# Patient Record
Sex: Male | Born: 1950
Health system: Southern US, Community
[De-identification: ages and names within clinical notes are randomized; demographics above are authoritative.]

## PROBLEM LIST (undated history)

## (undated) DIAGNOSIS — J45909 Unspecified asthma, uncomplicated: Secondary | ICD-10-CM

## (undated) DIAGNOSIS — T7840XA Allergy, unspecified, initial encounter: Secondary | ICD-10-CM

## (undated) DIAGNOSIS — E785 Hyperlipidemia, unspecified: Secondary | ICD-10-CM

## (undated) DIAGNOSIS — L409 Psoriasis, unspecified: Secondary | ICD-10-CM

## (undated) DIAGNOSIS — Z87442 Personal history of urinary calculi: Secondary | ICD-10-CM

## (undated) DIAGNOSIS — R918 Other nonspecific abnormal finding of lung field: Secondary | ICD-10-CM

## (undated) DIAGNOSIS — H269 Unspecified cataract: Secondary | ICD-10-CM

## (undated) DIAGNOSIS — M1990 Unspecified osteoarthritis, unspecified site: Secondary | ICD-10-CM

## (undated) DIAGNOSIS — L509 Urticaria, unspecified: Secondary | ICD-10-CM

## (undated) DIAGNOSIS — I10 Essential (primary) hypertension: Secondary | ICD-10-CM

## (undated) HISTORY — DX: Hyperlipidemia, unspecified: E78.5

## (undated) HISTORY — DX: Essential (primary) hypertension: I10

## (undated) HISTORY — DX: Unspecified asthma, uncomplicated: J45.909

## (undated) HISTORY — DX: Unspecified cataract: H26.9

## (undated) HISTORY — PX: COLON SURGERY: SHX602

## (undated) HISTORY — DX: Urticaria, unspecified: L50.9

## (undated) HISTORY — DX: Allergy, unspecified, initial encounter: T78.40XA

## (undated) HISTORY — PX: JOINT REPLACEMENT: SHX530

---
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
## 1998-02-13 ENCOUNTER — Emergency Department (HOSPITAL_COMMUNITY): Admission: EM | Admit: 1998-02-13 | Discharge: 1998-02-13 | Payer: Self-pay | Admitting: Emergency Medicine

## 2004-09-15 ENCOUNTER — Inpatient Hospital Stay (HOSPITAL_COMMUNITY): Admission: EM | Admit: 2004-09-15 | Discharge: 2004-09-18 | Payer: Self-pay | Admitting: Emergency Medicine

## 2004-09-16 ENCOUNTER — Encounter (INDEPENDENT_AMBULATORY_CARE_PROVIDER_SITE_OTHER): Payer: Self-pay | Admitting: Specialist

## 2004-10-21 ENCOUNTER — Ambulatory Visit (HOSPITAL_COMMUNITY): Admission: RE | Admit: 2004-10-21 | Discharge: 2004-10-21 | Payer: Self-pay | Admitting: Family Medicine

## 2004-10-22 ENCOUNTER — Ambulatory Visit: Payer: Self-pay | Admitting: Internal Medicine

## 2005-02-05 ENCOUNTER — Ambulatory Visit: Payer: Self-pay | Admitting: Internal Medicine

## 2005-02-13 ENCOUNTER — Ambulatory Visit: Payer: Self-pay | Admitting: Internal Medicine

## 2005-05-11 DIAGNOSIS — R918 Other nonspecific abnormal finding of lung field: Secondary | ICD-10-CM

## 2005-05-11 HISTORY — PX: CHOLECYSTECTOMY: SHX55

## 2005-05-11 HISTORY — DX: Other nonspecific abnormal finding of lung field: R91.8

## 2005-08-20 ENCOUNTER — Encounter: Admission: RE | Admit: 2005-08-20 | Discharge: 2005-08-20 | Payer: Self-pay | Admitting: Family Medicine

## 2010-03-12 ENCOUNTER — Encounter: Admission: RE | Admit: 2010-03-12 | Discharge: 2010-03-12 | Payer: Self-pay | Admitting: Family Medicine

## 2010-05-11 DIAGNOSIS — I10 Essential (primary) hypertension: Secondary | ICD-10-CM

## 2010-05-11 HISTORY — DX: Essential (primary) hypertension: I10

## 2010-09-26 NOTE — Discharge Summary (Signed)
NAMEYERICK, EGGEBRECHT NO.:  [id number]   MEDICAL RECORD NO.:  [id number]          PATIENT TYPE:  INP   LOCATION:  5741                         FACILITY:  [facility]   PHYSICIAN:  Guy Franco, P.A.       DATE OF BIRTH:  1951-02-14   DATE OF ADMISSION:  09/15/2004  DATE OF DISCHARGE:  09/18/2004                                 DISCHARGE SUMMARY   DISCHARGE DIAGNOSES:  1.  Acute gangrenous cholecystitis status post laparoscopic cholecystectomy      with intraoperative , as well as, JP drain (removed prior to discharge).  2.  Hyperlipidemia.  3.  Hypertension.  4.  Status post vasectomy.  5.  History of jaw surgery.   HOSPITAL COURSE:  Mr. Larmon is a 60 year old male patient who had a less  than 24 hour of right upper quadrant pain prior to admission.  He stated  that he had a similar attack last week that resolved spontaneously.  He  initially saw his primary care physician on the day of admission and was  found to have a white count of 19,000.  He was then prompted to [facility]gency Room where CT of the abdomen revealed acute cholecystitis.   The patient then underwent a laparoscopic cholecystectomy with  intraoperative cholangiogram, as well as, JP tube placement, once he was  diagnosed with acute gangrenous cholecystitis.  He tolerated procedure well.  He remained in the hospital for approximately two days, and on postop day  #2, he was ready for discharge to home.  Prior to his discharge to home, his  JP drainage was minimal, and this was pulled without problem.   DISCHARGE MEDICATIONS:  1.  Lotrel 5/20 one tablet daily.  2.  Crestor 10 mg daily.  3.  Baby aspirin daily.  4.  Tylox as needed for pain.   DISCHARGE INSTRUCTIONS:  He is not to lift for two weeks.  He may return to  work once.  We have reevaluated him in the office.  He is to return to the  office in 10-14 days for an appointment.  He is to call for this  appointment.  He may remove his  bandages in the shower.      LB/MEDQ  D:  09/18/2004  T:  09/18/2004  Job:  [id number]   cc:   Lorne Skeens. Hoxworth, M.D.  [address], [address]  [place]  [place] [address]   Ernestina Penna, M.D.  [address] [place]  [place] [address]  Fax: [phone]

## 2010-09-26 NOTE — Op Note (Signed)
Thomas Livingston, GOEDKEN NO.:  [id number]   MEDICAL RECORD NO.:  [id number]          PATIENT TYPE:  INP   LOCATION:  5741                         FACILITY:  [facility]   PHYSICIAN:  Sharlet Salina T. Hoxworth, M.D.DATE OF BIRTH:  May 25, 1950   DATE OF PROCEDURE:  09/16/2004  DATE OF DISCHARGE:                                 OPERATIVE REPORT   PREOPERATIVE DIAGNOSIS:  Acute cholecystitis.   POSTOPERATIVE DIAGNOSIS:  Acute gangrenous cholecystitis.   SURGICAL PROCEDURES:  Laparoscopic cholecystectomy with intraoperative  cholangiogram.   SURGEON:  Sharlet Salina T. Hoxworth, M.D.   ANESTHESIA:  General.   ASSISTANT:  None.   HISTORY OF PRESENT ILLNESS:  [name] is a 60 year old male who presents  with several days of acute abdominal pain. CT scan has shown significant  thickening and inflammatory change of the gallbladder. I have recommended  proceeding with laparoscopic cholecystectomy with cholangiogram for apparent  acute cholecystitis.  The nature of the procedure, its indications and risks  of bleeding, infection, bile leak, bile duct injury, possible need for open  procedure were discussed understood. He is now brought to the operating room  for this procedure.   DESCRIPTION OF PROCEDURE:  The patient was brought to the operating room and  placed in supine position on the operating table and general endotracheal  anesthesia induced.  He was already on broad-spectrum antibiotics.  The  abdomen was widely sterilely prepped and draped. Local anesthesia was used  to infiltrate the trocar sites prior to the incisions.  A 1 cm incision was  made at the umbilicus. Dissection carried down the midline fascia which was  sharply incised for 1 cm and the peritoneum entered under direct vision.  Through a mattress suture of 0 Vicryl, the Hasson trocar was placed and  pneumoperitoneum established. Under direct vision a 10 mm trocar was placed  in subxiphoid area and two 5 mm  trocars on the right subcostal margin. The  gallbladder was exposed by peeling back inflammatory attachments of the  omentum and was seen to be severely and acutely inflamed and thickened with  areas of gangrenous change. The gallbladder was decompressed through the  aspiration needle to allow the fundus to be grasped and elevated over the  liver. The infundibulum was retracted inferolaterally.  With the 30 degrees  scope in place, the infundibulum,  Calot's triangle and porta hepatis could  be well visualized. Using mostly blunt dissection with irrigation, the  distal gallbladder and Calot's triangle was dissected relatively easily due  to the acute edema and inflammatory changes. The cystic duct was identified  and dissected out, the cystic duct gallbladder junction dissected 360  degrees. The cystic artery was also identified in Calot's triangle coursing  up on the gallbladder wall. When the anatomy was clear, the cystic duct was  clipped at the gallbladder junction.  Operative cholangiogram obtained  through the cystic duct which showed good filling of the common bile duct  and intrahepatic ducts with free flow into the duodenum and no filling  defects. Following this, cholangiocath was removed and the cystic duct was  triply clipped  proximally and divided. The cystic artery was doubly clipped  proximally, clipped distally and divided.  The gallbladder was then  dissected free from its bed using hook cautery. The dissection was tedious  due to severe inflammatory changes but proceeded satisfactorily until the  gallbladder was detached, placed in an EndoCatch bag and brought through the  umbilicus. Following this, the gallbladder bed was irrigated and complete  hemostasis obtained with cautery and Surgicel pack. A closed suction drain  was left in the subhepatic space and Morrisons pouch and brought out through  one of the lateral trocar sites. All trocars removed and CO2 evacuated. The   mattress sutures secured at the umbilicus. Skin incisions were closed with  interrupted subcuticular 4-0 Monocryl and Steri-Strips.  Sponge, needle and  instrument counts were correct.  Dry sterile dressings were applied. The  patient taken to recovery in good condition.      BTH/MEDQ  D:  09/16/2004  T:  09/16/2004  Job:  [id number]

## 2010-09-26 NOTE — H&P (Signed)
Thomas Livingston, Thomas Livingston NO.:  [id number]   MEDICAL RECORD NO.:  [id number]          PATIENT TYPE:  INP   LOCATION:  1830                         FACILITY:  [facility]   PHYSICIAN:  Gabrielle Dare. [name], M.D.DATE OF BIRTH:  November 06, 1950   DATE OF ADMISSION:  09/15/2004  DATE OF DISCHARGE:                                HISTORY & PHYSICAL   CHIEF COMPLAINT:  Right upper quadrant abdominal pain.   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  He had a similar attack of this pain last  week that resolved spontaneously after about 3 hours, this pain that started  last night at 11 p.m. continued today and he went to his primary doctor this  afternoon, he was found to have an elevated white blood cell count of 19,000  and he was sent to [facility] Emergency Department for further evaluation  and workup here shows normal liver function tests, CT scan of the abdomen  and pelvis was then obtained which is consistent with acute cholecystitis.  The patient received some pain medications and his pain is significantly  decreased at this time.  He has some mild associated nausea.   PAST MEDICAL HISTORY:  Hypercholesterolemia and hypertension.   PAST SURGICAL HISTORY:  Jaw surgery for an infection, cysto and vasectomy.   CURRENT MEDICATIONS:  1.  Lotrel 5/20 one p.o. once daily.  2.  Crestor one p.o. once daily.  3.  Aspirin 81 mg p.o. once daily.   SOCIAL HISTORY:  He does not smoke or drink alcohol.  He works for the  [employer] department.   REVIEW OF SYSTEMS:  GENERAL:  Negative.  CARDIAC:  Negative.  RESPIRATORY:  Negative.  GI:  See the history of present illness.  GU:  Negative.  MUSCULOSKELETAL:  Negative.   PHYSICAL EXAMINATION:  VITAL SIGNS:  Temperature is 98.7, blood pressure  143/83, pulse 103, respirations 20.  GENERAL:  He is awake,  alert, in no acute distress.  HEAD AND NECK:  Pupils are reactive.  Sclera is clear with no icterus.  Neck  is supple with no tenderness.  LYMPH:  He has no periumbilical, supraclavicular or cervical adenopathy.  CHEST:  Lungs are clear to auscultation bilaterally.  Heart is regular rate  and rhythm, point of maximal impulse is palpable laterally in the left  chest.  ABDOMEN:  Abdomen is soft and nondistended.  He has mild right upper  quadrant tenderness to deep palpation with no guarding, no masses are noted,  no organomegaly is palpated.  SKIN:  Warm and dry with no rashes.  MUSCULOSKELETAL EXAM:  Strength is equal in upper and lower extremities  bilaterally.   DATA REVIEWED INCLUDES:  Sodium 132, potassium 3.8, chloride 100, CO2 25,  BUN 6, creatinine 1.  Liver function tests within normal limits.  CT scan of  the abdomen and pelvis as above.   IMPRESSION AND PLAN:  Acute cholecystitis.  Plan will be to admit the  patient, give him intravenous fluids and intravenous antibiotics, we will  also plan laparoscopic cholecystectomy possibly tomorrow.  I will discuss  this in detail with Dr. Johna Sheriff who is working in the hospital for our  group this week.  I did go on and discuss the procedure risks and benefits  with the patient and his wife, questions were answered and he is agreeable  with the plan.      BET/MEDQ  D:  09/15/2004  T:  09/15/2004  Job:  [id number]

## 2010-11-07 ENCOUNTER — Encounter: Payer: Self-pay | Admitting: Gastroenterology

## 2012-08-19 ENCOUNTER — Telehealth: Payer: Self-pay | Admitting: Family Medicine

## 2012-08-19 MED ORDER — ROSUVASTATIN CALCIUM 10 MG PO TABS: 10.0000 mg | ORAL_TABLET | Freq: Every day | ORAL | Status: DC

## 2012-08-19 NOTE — Telephone Encounter (Signed)
Rx Refilled  

## 2012-08-29 ENCOUNTER — Other Ambulatory Visit: Payer: Self-pay | Admitting: Family Medicine

## 2012-09-13 ENCOUNTER — Other Ambulatory Visit: Payer: Self-pay | Admitting: Dermatology

## 2012-10-05 ENCOUNTER — Ambulatory Visit (INDEPENDENT_AMBULATORY_CARE_PROVIDER_SITE_OTHER): Payer: 59 | Admitting: Family Medicine

## 2012-10-05 ENCOUNTER — Encounter: Payer: Self-pay | Admitting: Family Medicine

## 2012-10-05 VITALS — BP 110/76 | HR 80 | Temp 98.7°F | Resp 16 | Wt 229.0 lb

## 2012-10-05 DIAGNOSIS — T7840XA Allergy, unspecified, initial encounter: Secondary | ICD-10-CM

## 2012-10-05 DIAGNOSIS — E785 Hyperlipidemia, unspecified: Secondary | ICD-10-CM | POA: Insufficient documentation

## 2012-10-05 DIAGNOSIS — I10 Essential (primary) hypertension: Secondary | ICD-10-CM | POA: Insufficient documentation

## 2012-10-05 MED ORDER — PREDNISONE 20 MG PO TABS: ORAL_TABLET | ORAL | Status: DC

## 2012-10-05 MED ORDER — EPINEPHRINE 0.3 MG/0.3ML IJ SOAJ: 0.3000 mg | Freq: Once | INTRAMUSCULAR | Status: DC

## 2012-10-05 MED ORDER — METHYLPREDNISOLONE ACETATE 80 MG/ML IJ SUSP
80.0000 mg | Freq: Once | INTRAMUSCULAR | Status: AC
  Administered 2012-10-05: 80 mg via INTRAMUSCULAR

## 2012-10-05 NOTE — Progress Notes (Signed)
  Subjective:    Patient ID: Thomas Livingston, male    DOB: 1950/09/09, 62 y.o.   MRN: [id number]  HPI Beginning Friday the patient noticed widespread urticaria on his trunk, back, and arms. Beginning Saturday he noticed swelling in his upper lip. He denies any swelling of his tongue. He denies any stridor.  He denies any recent ingestions which may be unusual for him. He has been eating more strawberries recently. He is also had eggs.  He denies new nuts.  He is currently on Lotrel for his blood pressure. He has been taking that for years.Marland Kitchen  He is using Benadryl intermittently over the last few days. Past Medical History  Diagnosis Date  . Hypertension   . Hyperlipidemia    Current Outpatient Prescriptions on File Prior to Visit  Medication Sig Dispense Refill  . fluticasone (FLONASE) 50 MCG/ACT nasal spray Use 2 sprays in each  nostril daily  48 g  11  . rosuvastatin (CRESTOR) 10 MG tablet Take 1 tablet (10 mg total) by mouth daily.  90 tablet  3   No current facility-administered medications on file prior to visit.   Allergies  Allergen Reactions  . Chalk       Review of Systems  All other systems reviewed and are negative.       Objective:   Physical Exam  Vitals reviewed. Constitutional: He appears well-developed and well-nourished.  HENT:  Right Ear: External ear normal.  Left Ear: External ear normal.  Nose: Nose normal.  Mouth/Throat: Oropharynx is clear and moist. No oropharyngeal exudate.  Neck: Normal range of motion. Neck supple.  Cardiovascular: Normal rate and normal heart sounds.  Exam reveals no friction rub.   No murmur heard. Pulmonary/Chest: Effort normal and breath sounds normal. No respiratory distress. He has no wheezes. He has no rales. He exhibits no tenderness.  Abdominal: Soft. Bowel sounds are normal. He exhibits no distension and no mass. There is no tenderness. There is no rebound and no guarding.  Lymphadenopathy:    He has no cervical  adenopathy.  Skin: Skin is warm. Rash noted.   He has urticaria on his top. His upper lip is mildly swollen. He has no stridor       Assessment & Plan:  1. Allergic reaction, initial encounter I asked the patient to stop Lotrel immediately. I also asked him to avoid strawberries and headaches. Depo-Medrol 80 mg IM was given here. Advised patient to begin taking Benadryl 25 mg every 4 hours. Also prescribed prednisone dose pack. Recheck the patient in 24 hours if no better, sooner if worse. Recheck blood pressure in one week. - predniSONE (DELTASONE) 20 MG tablet; 3 tabs poqday 1-2, 2 tabs poqday 3-4, 1 tab poqday 5-6  Dispense: 12 tablet; Refill: 0 - EPINEPHrine (EPIPEN) 0.3 mg/0.3 mL DEVI; Inject 0.3 mLs (0.3 mg total) into the muscle once.  Dispense: 1 Device; Refill: 1

## 2012-12-14 ENCOUNTER — Encounter: Payer: Self-pay | Admitting: Family Medicine

## 2012-12-14 ENCOUNTER — Ambulatory Visit (INDEPENDENT_AMBULATORY_CARE_PROVIDER_SITE_OTHER): Payer: 59 | Admitting: Family Medicine

## 2012-12-14 VITALS — BP 110/74 | HR 68 | Temp 98.2°F | Resp 16 | Wt 219.0 lb

## 2012-12-14 DIAGNOSIS — J019 Acute sinusitis, unspecified: Secondary | ICD-10-CM

## 2012-12-14 MED ORDER — AMOXICILLIN-POT CLAVULANATE 875-125 MG PO TABS: 1.0000 | ORAL_TABLET | Freq: Two times a day (BID) | ORAL | Status: DC

## 2012-12-14 NOTE — Progress Notes (Signed)
Subjective:    Patient ID: Thomas Livingston, male    DOB: Sep 14, 1950, 62 y.o.   MRN: [id number]  HPI Patient reports one week of pain and pressure around his frontal sinuses. He has a constant dull headache. He awakens every morning with congestion and sneezing and rhinorrhea. He has an occasional cough due to postnasal drip. He denies any high fever. He has had some stiffness and tightness in neck. He has no meningismus today. He has no rashes on his body. He has had a recent tick bite. He denies any joint stiffness or swelling. He denies any neurologic deficits or migraine-like symptoms. Past Medical History  Diagnosis Date  . Hypertension   . Hyperlipidemia    Current Outpatient Prescriptions on File Prior to Visit  Medication Sig Dispense Refill  . aspirin 81 MG tablet Take 81 mg by mouth daily.      Marland Kitchen EPINEPHrine (EPIPEN) 0.3 mg/0.3 mL DEVI Inject 0.3 mLs (0.3 mg total) into the muscle once.  1 Device  1  . fluticasone (FLONASE) 50 MCG/ACT nasal spray Use 2 sprays in each  nostril daily  48 g  11  . rosuvastatin (CRESTOR) 10 MG tablet Take 1 tablet (10 mg total) by mouth daily.  90 tablet  3   No current facility-administered medications on file prior to visit.   Allergies  Allergen Reactions  . Chalk    History   Social History  . Marital Status: Married    Spouse Name: N/A    Number of Children: N/A  . Years of Education: N/A   Occupational History  . Not on file.   Social History Main Topics  . Smoking status: Never Smoker   . Smokeless tobacco: Not on file  . Alcohol Use: No  . Drug Use: No  . Sexually Active: Not on file   Other Topics Concern  . Not on file   Social History Narrative  . No narrative on file      Review of Systems  All other systems reviewed and are negative.       Objective:   Physical Exam  Vitals reviewed. HENT:  Right Ear: External ear normal.  Left Ear: External ear normal.  Nose: Nose normal.  Mouth/Throat: Oropharynx is  clear and moist. No oropharyngeal exudate.  Eyes: Conjunctivae and EOM are normal. Pupils are equal, round, and reactive to light.  Cardiovascular: Normal rate, regular rhythm, normal heart sounds and intact distal pulses.   No murmur heard. Pulmonary/Chest: Effort normal and breath sounds normal. No respiratory distress. He has no wheezes. He has no rales. He exhibits no tenderness.  Abdominal: Soft. Bowel sounds are normal. He exhibits no distension. There is no tenderness. There is no rebound.  Musculoskeletal: Normal range of motion.  Lymphadenopathy:    He has no cervical adenopathy.  Skin: Skin is warm. No rash noted. No erythema.   no meningismus.        Assessment & Plan:  1. Acute rhinosinusitis I believe this is either a tension headache or rhinosinusitis. I recommended Augmentin 875 by mouth twice a day for 10 days. Recommended ibuprofen 800 mg 3 times a day for headache. He is to use this sparingly and as needed. I recommended he push fluids to protect his kidneys and prevent possible dehydration due to is extensive work outside in the heat. Recheck in 1 week if no better or sooner if her - amoxicillin-clavulanate (AUGMENTIN) 875-125 MG per tablet; Take 1 tablet by mouth 2 (  two) times daily.  Dispense: 20 tablet; Refill: 0

## 2013-07-02 ENCOUNTER — Other Ambulatory Visit: Payer: Self-pay | Admitting: Family Medicine

## 2013-09-12 ENCOUNTER — Encounter: Payer: Self-pay | Admitting: Gastroenterology

## 2013-11-25 ENCOUNTER — Other Ambulatory Visit: Payer: Self-pay | Admitting: Family Medicine

## 2013-12-16 ENCOUNTER — Encounter (HOSPITAL_COMMUNITY): Payer: Self-pay | Admitting: Emergency Medicine

## 2013-12-16 ENCOUNTER — Emergency Department (INDEPENDENT_AMBULATORY_CARE_PROVIDER_SITE_OTHER)
Admission: EM | Admit: 2013-12-16 | Discharge: 2013-12-16 | Disposition: A | Discharge status: Home / Self Care | Payer: 59 | Source: Home / Self Care | Attending: Family Medicine | Admitting: Family Medicine

## 2013-12-16 DIAGNOSIS — S51809A Unspecified open wound of unspecified forearm, initial encounter: Secondary | ICD-10-CM

## 2013-12-16 DIAGNOSIS — S51801A Unspecified open wound of right forearm, initial encounter: Secondary | ICD-10-CM

## 2013-12-16 DIAGNOSIS — Z23 Encounter for immunization: Secondary | ICD-10-CM

## 2013-12-16 DIAGNOSIS — X58XXXA Exposure to other specified factors, initial encounter: Secondary | ICD-10-CM

## 2013-12-16 MED ORDER — TETANUS-DIPHTH-ACELL PERTUSSIS 5-2.5-18.5 LF-MCG/0.5 IM SUSP
INTRAMUSCULAR | Status: AC
  Filled 2013-12-16: qty 0.5

## 2013-12-16 MED ORDER — TETANUS-DIPHTH-ACELL PERTUSSIS 5-2.5-18.5 LF-MCG/0.5 IM SUSP
0.5000 mL | Freq: Once | INTRAMUSCULAR | Status: AC
  Administered 2013-12-16: 0.5 mL via INTRAMUSCULAR

## 2013-12-16 NOTE — ED Provider Notes (Signed)
Thomas Livingston is a 63 y.o. male who presents to Urgent Care today for Right forearm laceration. Patient had a thin light pole land on his forearm at home today. This resulted in a laceration. He feels well. The injury happened 40 mins prior to presenting. The pain is moderate. He denies any radiating pain or weakness or numbness. No fever or chills. He is unsure about his last tetanus vaccination.    Past Medical History  Diagnosis Date  . Hypertension   . Hyperlipidemia    History  Substance Use Topics  . Smoking status: Never Smoker   . Smokeless tobacco: Not on file  . Alcohol Use: No   ROS as above Medications: No current facility-administered medications for this encounter.   Current Outpatient Prescriptions  Medication Sig Dispense Refill  . amoxicillin-clavulanate (AUGMENTIN) 875-125 MG per tablet Take 1 tablet by mouth 2 (two) times daily.  20 tablet  0  . aspirin 81 MG tablet Take 81 mg by mouth daily.      . CRESTOR 10 MG tablet Take 1 tablet by mouth  daily  90 tablet  0  . EPINEPHrine (EPIPEN) 0.3 mg/0.3 mL DEVI Inject 0.3 mLs (0.3 mg total) into the muscle once.  1 Device  1  . fluticasone (FLONASE) 50 MCG/ACT nasal spray Use 2 sprays in each  nostril daily  48 g  11    Exam:  BP 137/84  Pulse 70  Temp(Src) 98.3 F (36.8 C) (Oral)  Resp 20  Ht 6\' 2"  (1.88 m)  Wt 217 lb (98.431 kg)  BMI 27.85 kg/m2  SpO2 98% Gen: Well NAD Skin: Right arm: Shallow V-shaped avulsion to right forearm aprox 2 cm in length. The skin is dusky.  Distal Arm: Normal sensation and pulses and motion.   No results found for this or any previous visit (from the past 24 hour(s)). No results found.  Assessment and Plan: 63 y.o. male with Skin avulsion. Shallow. Wound cleaned and dressed. F/u with PCP.  Unable to suture.  TDAP given prior to D/C  Discussed warning signs or symptoms. Please see discharge instructions. Patient expresses understanding.   This note was created using  Systems analyst. Any transcription errors are unintended.    Gregor Hams, MD 12/16/13 [phone]

## 2013-12-16 NOTE — ED Notes (Signed)
Pt reports avulsion to right forearm onset today about 45 minutes ago States an aluminum pipe landed on his arm Last tetanus unknown  Alert w/no signs of acute distress.

## 2013-12-16 NOTE — Discharge Instructions (Signed)
Thank you for coming in today. Keep the wound clean.  Come back as needed.  Follow up with your doctor.    Laceration Care, Adult A laceration is a cut or lesion that goes through all layers of the skin and into the tissue just beneath the skin. TREATMENT  Some lacerations may not require closure. Some lacerations may not be able to be closed due to an increased risk of infection. It is important to see your caregiver as soon as possible after an injury to minimize the risk of infection and maximize the opportunity for successful closure. If closure is appropriate, pain medicines may be given, if needed. The wound will be cleaned to help prevent infection. Your caregiver will use stitches (sutures), staples, wound glue (adhesive), or skin adhesive strips to repair the laceration. These tools bring the skin edges together to allow for faster healing and a better cosmetic outcome. However, all wounds will heal with a scar. Once the wound has healed, scarring can be minimized by covering the wound with sunscreen during the day for 1 full year. HOME CARE INSTRUCTIONS  For sutures or staples:  Keep the wound clean and dry.  If you were given a bandage (dressing), you should change it at least once a day. Also, change the dressing if it becomes wet or dirty, or as directed by your caregiver.  Wash the wound with soap and water 2 times a day. Rinse the wound off with water to remove all soap. Pat the wound dry with a clean towel.  After cleaning, apply a thin layer of the antibiotic ointment as recommended by your caregiver. This will help prevent infection and keep the dressing from sticking.  You may shower as usual after the first 24 hours. Do not soak the wound in water until the sutures are removed.  Only take over-the-counter or prescription medicines for pain, discomfort, or fever as directed by your caregiver.  Get your sutures or staples removed as directed by your caregiver. For skin  adhesive strips:  Keep the wound clean and dry.  Do not get the skin adhesive strips wet. You may bathe carefully, using caution to keep the wound dry.  If the wound gets wet, pat it dry with a clean towel.  Skin adhesive strips will fall off on their own. You may trim the strips as the wound heals. Do not remove skin adhesive strips that are still stuck to the wound. They will fall off in time. For wound adhesive:  You may briefly wet your wound in the shower or bath. Do not soak or scrub the wound. Do not swim. Avoid periods of heavy perspiration until the skin adhesive has fallen off on its own. After showering or bathing, gently pat the wound dry with a clean towel.  Do not apply liquid medicine, cream medicine, or ointment medicine to your wound while the skin adhesive is in place. This may loosen the film before your wound is healed.  If a dressing is placed over the wound, be careful not to apply tape directly over the skin adhesive. This may cause the adhesive to be pulled off before the wound is healed.  Avoid prolonged exposure to sunlight or tanning lamps while the skin adhesive is in place. Exposure to ultraviolet light in the first year will darken the scar.  The skin adhesive will usually remain in place for 5 to 10 days, then naturally fall off the skin. Do not pick at the adhesive film. You  may need a tetanus shot if:  You cannot remember when you had your last tetanus shot.  You have never had a tetanus shot. If you get a tetanus shot, your arm may swell, get red, and feel warm to the touch. This is common and not a problem. If you need a tetanus shot and you choose not to have one, there is a rare chance of getting tetanus. Sickness from tetanus can be serious. SEEK MEDICAL CARE IF:   You have redness, swelling, or increasing pain in the wound.  You see a red line that goes away from the wound.  You have yellowish-white fluid (pus) coming from the wound.  You have  a fever.  You notice a bad smell coming from the wound or dressing.  Your wound breaks open before or after sutures have been removed.  You notice something coming out of the wound such as wood or glass.  Your wound is on your hand or foot and you cannot move a finger or toe. SEEK IMMEDIATE MEDICAL CARE IF:   Your pain is not controlled with prescribed medicine.  You have severe swelling around the wound causing pain and numbness or a change in color in your arm, hand, leg, or foot.  Your wound splits open and starts bleeding.  You have worsening numbness, weakness, or loss of function of any joint around or beyond the wound.  You develop painful lumps near the wound or on the skin anywhere on your body. MAKE SURE YOU:   Understand these instructions.  Will watch your condition.  Will get help right away if you are not doing well or get worse. Document Released: 04/27/2005 Document Revised: 07/20/2011 Document Reviewed: 10/21/2010 [facility] Patient Information 2015 [place], [place]. This information is not intended to replace advice given to you by your health care provider. Make sure you discuss any questions you have with your health care provider.

## 2014-01-10 ENCOUNTER — Other Ambulatory Visit: Payer: Self-pay | Admitting: *Deleted

## 2014-01-10 MED ORDER — FLUTICASONE PROPIONATE 50 MCG/ACT NA SUSP: NASAL | Status: AC

## 2014-01-10 NOTE — Telephone Encounter (Signed)
Received fax requesting refill on Flonase.   Refill appropriate and filled per protocol.  

## 2014-02-05 ENCOUNTER — Other Ambulatory Visit: Payer: Self-pay | Admitting: Family Medicine

## 2014-02-05 NOTE — Telephone Encounter (Signed)
Medication filled x1 with no refills.   Requires office visit before any further refills can be given.   Letter sent.  

## 2014-02-12 ENCOUNTER — Ambulatory Visit (INDEPENDENT_AMBULATORY_CARE_PROVIDER_SITE_OTHER): Payer: 59 | Admitting: Family Medicine

## 2014-02-12 ENCOUNTER — Encounter: Payer: Self-pay | Admitting: Family Medicine

## 2014-02-12 VITALS — BP 110/70 | HR 72 | Temp 98.0°F | Resp 16 | Ht 74.0 in | Wt 222.0 lb

## 2014-02-12 DIAGNOSIS — J310 Chronic rhinitis: Secondary | ICD-10-CM

## 2014-02-12 DIAGNOSIS — J329 Chronic sinusitis, unspecified: Secondary | ICD-10-CM

## 2014-02-12 MED ORDER — LEVOFLOXACIN 500 MG PO TABS: 500.0000 mg | ORAL_TABLET | Freq: Every day | ORAL | Status: DC

## 2014-02-12 NOTE — Progress Notes (Signed)
   Subjective:    Patient ID: Thomas Livingston, male    DOB: June 12, 1950, 63 y.o.   MRN: [id number]  HPI Patient has had a cough for 10 days. The cough is nonproductive. However he has pain in his right frontal sinus and in both maxillary sinuses. He has constant postnasal drip, a dull headache and subjective fevers. He also has a sore throat due to postnasal drip. He's been trying Allegra and Flonase with no relief. Past Medical History  Diagnosis Date  . Hypertension   . Hyperlipidemia    No past surgical history on file. Current Outpatient Prescriptions on File Prior to Visit  Medication Sig Dispense Refill  . aspirin 81 MG tablet Take 81 mg by mouth daily.      Marland Kitchen EPINEPHrine (EPIPEN) 0.3 mg/0.3 mL DEVI Inject 0.3 mLs (0.3 mg total) into the muscle once.  1 Device  1  . fluticasone (FLONASE) 50 MCG/ACT nasal spray Use 2 sprays in each  nostril daily  48 g  3   No current facility-administered medications on file prior to visit.   Allergies  Allergen Reactions  . Chalk   . Crestor [Rosuvastatin] Rash   History   Social History  . Marital Status: Married    Spouse Name: N/A    Number of Children: N/A  . Years of Education: N/A   Occupational History  . Not on file.   Social History Main Topics  . Smoking status: Never Smoker   . Smokeless tobacco: Not on file  . Alcohol Use: No  . Drug Use: No  . Sexual Activity: Not on file   Other Topics Concern  . Not on file   Social History Narrative  . No narrative on file      Review of Systems  All other systems reviewed and are negative.      Objective:   Physical Exam  Vitals reviewed. Constitutional: He appears well-developed and well-nourished.  HENT:  Right Ear: Tympanic membrane, external ear and ear canal normal.  Left Ear: Tympanic membrane, external ear and ear canal normal. No decreased hearing is noted.  Nose: Mucosal edema and rhinorrhea present. Right sinus exhibits maxillary sinus tenderness and  frontal sinus tenderness.  Mouth/Throat: Oropharynx is clear and moist. No oropharyngeal exudate.  Cardiovascular: Normal rate, regular rhythm and normal heart sounds.   Pulmonary/Chest: Effort normal and breath sounds normal. No respiratory distress. He has no wheezes. He has no rales.  Lymphadenopathy:    He has no cervical adenopathy.          Assessment & Plan:  Rhinosinusitis - Plan: levofloxacin (LEVAQUIN) 500 MG tablet   Patient has acute rhinosinusitis with postnasal drip causing a cough. Begin Levaquin 500 mg by mouth daily for 7 days. Add Mucinex 1 or milligrams every 4 hours when necessary cough. Continue Allegra and Flonase for nasal congestion.

## 2014-02-27 ENCOUNTER — Encounter: Payer: Self-pay | Admitting: Family Medicine

## 2014-02-27 ENCOUNTER — Ambulatory Visit (INDEPENDENT_AMBULATORY_CARE_PROVIDER_SITE_OTHER): Payer: 59 | Admitting: Family Medicine

## 2014-02-27 VITALS — BP 122/74 | HR 74 | Temp 98.2°F | Resp 16 | Ht 74.0 in | Wt 224.0 lb

## 2014-02-27 DIAGNOSIS — Z23 Encounter for immunization: Secondary | ICD-10-CM

## 2014-02-27 DIAGNOSIS — Z0000 Encounter for general adult medical examination without abnormal findings: Secondary | ICD-10-CM

## 2014-02-27 LAB — CBC WITH DIFFERENTIAL/PLATELET
Basophils Absolute: 0 10*3/uL (ref 0.0–0.1)
Basophils Relative: 0 % (ref 0–1)
Eosinophils Absolute: 0.3 10*3/uL (ref 0.0–0.7)
Eosinophils Relative: 4 % (ref 0–5)
HCT: 44.1 % (ref 39.0–52.0)
Hemoglobin: 15 g/dL (ref 13.0–17.0)
Lymphocytes Relative: 11 % — ABNORMAL LOW (ref 12–46)
Lymphs Abs: 0.9 10*3/uL (ref 0.7–4.0)
MCH: 30.7 pg (ref 26.0–34.0)
MCHC: 34 g/dL (ref 30.0–36.0)
MCV: 90.2 fL (ref 78.0–100.0)
Monocytes Absolute: 0.9 10*3/uL (ref 0.1–1.0)
Monocytes Relative: 11 % (ref 3–12)
Neutro Abs: 5.8 10*3/uL (ref 1.7–7.7)
Neutrophils Relative %: 74 % (ref 43–77)
Platelets: 277 10*3/uL (ref 150–400)
RBC: 4.89 MIL/uL (ref 4.22–5.81)
RDW: 13.5 % (ref 11.5–15.5)
WBC: 7.8 10*3/uL (ref 4.0–10.5)

## 2014-02-27 LAB — COMPLETE METABOLIC PANEL WITH GFR
ALT: 17 U/L (ref 0–53)
AST: 20 U/L (ref 0–37)
Albumin: 3.9 g/dL (ref 3.5–5.2)
Alkaline Phosphatase: 71 U/L (ref 39–117)
BUN: 11 mg/dL (ref 6–23)
CO2: 30 mEq/L (ref 19–32)
Calcium: 9.2 mg/dL (ref 8.4–10.5)
Chloride: 103 mEq/L (ref 96–112)
Creat: 0.87 mg/dL (ref 0.50–1.35)
GFR, Est African American: >89 mL/min
GFR, Est Non African American: >89 mL/min
Glucose, Bld: 85 mg/dL (ref 70–99)
Potassium: 4.5 mEq/L (ref 3.5–5.3)
Sodium: 139 mEq/L (ref 135–145)
Total Bilirubin: 0.7 mg/dL (ref 0.2–1.2)
Total Protein: 6.3 g/dL (ref 6.0–8.3)

## 2014-02-27 LAB — LIPID PANEL
Cholesterol: 144 mg/dL (ref 0–200)
HDL: 44 mg/dL (ref >39)
LDL Cholesterol: 82 mg/dL (ref 0–99)
Total CHOL/HDL Ratio: 3.3 Ratio
Triglycerides: 90 mg/dL (ref <150)
VLDL: 18 mg/dL (ref 0–40)

## 2014-02-27 MED ORDER — ZOSTER VACCINE LIVE 19400 UNT/0.65ML ~~LOC~~ SOLR: 0.6500 mL | Freq: Once | SUBCUTANEOUS | Status: DC

## 2014-02-27 NOTE — Progress Notes (Signed)
Subjective:    Patient ID: Thomas Livingston, male    DOB: [birth year], 63 y.o.   MRN: [id number]  HPI Patient is a very pleasant 63 year old white male who is here today for complete physical exam. Patient works as a Herbalist at [employer]. He has no medical concerns. He is due today for his flu shot. He is also interested in the shingles vaccine. He is due for his colonoscopy but he schedule this on his iron. He is also due for his prostate exam. Otherwise he is doing very well. He recently started Advil for hip osteoarthritis under the care of an orthopedist. Cautioned patient against daily use of NSAIDs causing stomach irritation. Also recommended that he not combine aspirin and NSAIDs he is to use an regular basis. Past Medical History  Diagnosis Date  . Hypertension   . Hyperlipidemia    No past surgical history on file. Current Outpatient Prescriptions on File Prior to Visit  Medication Sig Dispense Refill  . aspirin 81 MG tablet Take 81 mg by mouth daily.      Marland Kitchen EPINEPHrine (EPIPEN) 0.3 mg/0.3 mL DEVI Inject 0.3 mLs (0.3 mg total) into the muscle once.  1 Device  1  . fluticasone (FLONASE) 50 MCG/ACT nasal spray Use 2 sprays in each  nostril daily  48 g  3   No current facility-administered medications on file prior to visit.   Allergies  Allergen Reactions  . Chalk   . Crestor [Rosuvastatin] Rash   History   Social History  . Marital Status: Married    Spouse Name: N/A    Number of Children: N/A  . Years of Education: N/A   Occupational History  . Not on file.   Social History Main Topics  . Smoking status: Never Smoker   . Smokeless tobacco: Not on file  . Alcohol Use: No  . Drug Use: No  . Sexual Activity: Not on file   Other Topics Concern  . Not on file   Social History Narrative  . No narrative on file   No family history on file.    Review of Systems  All other systems reviewed and are negative.      Objective:   Physical  Exam  Vitals reviewed. Constitutional: He is oriented to person, place, and time. He appears well-developed and well-nourished. No distress.  HENT:  Head: Normocephalic and atraumatic.  Right Ear: External ear normal.  Left Ear: External ear normal.  Nose: Nose normal.  Mouth/Throat: Oropharynx is clear and moist. No oropharyngeal exudate.  Eyes: Conjunctivae and EOM are normal. Pupils are equal, round, and reactive to light. Right eye exhibits no discharge. Left eye exhibits no discharge. No scleral icterus.  Neck: Normal range of motion. Neck supple. No JVD present. No tracheal deviation present. No thyromegaly present.  Cardiovascular: Normal rate, regular rhythm, normal heart sounds and intact distal pulses.  Exam reveals no gallop and no friction rub.   No murmur heard. Pulmonary/Chest: Effort normal and breath sounds normal. No stridor. No respiratory distress. He has no wheezes. He has no rales. He exhibits no tenderness.  Abdominal: Soft. Bowel sounds are normal. He exhibits no distension and no mass. There is no tenderness. There is no rebound and no guarding.  Genitourinary: Rectum normal and prostate normal.  Musculoskeletal: Normal range of motion. He exhibits no edema and no tenderness.  Lymphadenopathy:    He has no cervical adenopathy.  Neurological: He is alert and oriented to  person, place, and time. He has normal reflexes. He displays normal reflexes. No cranial nerve deficit. He exhibits normal muscle tone. Coordination normal.  Skin: Skin is warm. No rash noted. No erythema. No pallor.  Psychiatric: He has a normal mood and affect. His behavior is normal. Judgment and thought content normal.          Assessment & Plan:  Routine general medical examination at a health care facility - Plan: CBC with Differential, COMPLETE METABOLIC PANEL WITH GFR, Lipid panel, PSA  patient's physical exam is completely normal. He received his flu shot today. We sent a prescription for  the shingles vaccine to his pharmacy. I also checked a CBC, CMP, fasting lipid panel, and PSA. I recommended 10 pounds of weight loss and increasing aerobic exercise. Patient's blood pressure is excellent. His goal LDL cholesterol is less than 130. Is up-to-date.

## 2014-02-27 NOTE — Addendum Note (Signed)
Addended by: Shary Decamp B on: 02/27/2014 09:16 AM   Modules accepted: Orders

## 2014-02-28 LAB — PSA: PSA: 0.66 ng/mL (ref <=4.00)

## 2014-03-01 ENCOUNTER — Encounter: Payer: Self-pay | Admitting: Family Medicine

## 2014-04-03 ENCOUNTER — Encounter: Payer: Self-pay | Admitting: Gastroenterology

## 2014-05-06 ENCOUNTER — Other Ambulatory Visit: Payer: Self-pay | Admitting: Family Medicine

## 2014-05-06 NOTE — Telephone Encounter (Signed)
Refill appropriate and filled per protocol. 

## 2014-05-18 ENCOUNTER — Ambulatory Visit (AMBULATORY_SURGERY_CENTER): Payer: Self-pay | Admitting: *Deleted

## 2014-05-18 VITALS — Ht 74.0 in | Wt 230.0 lb

## 2014-05-18 DIAGNOSIS — Z1211 Encounter for screening for malignant neoplasm of colon: Secondary | ICD-10-CM

## 2014-05-18 MED ORDER — MOVIPREP 100 G PO SOLR: ORAL | Status: DC

## 2014-05-18 NOTE — Progress Notes (Signed)
Patient denies any allergies to eggs or soy. Patient denies any problems with anesthesia/sedation. Patient denies any oxygen use at home and does not take any diet/weight loss medications. EMMI education assisgned to patient on colonoscopy, this was explained and instructions given to patient. 

## 2014-06-01 ENCOUNTER — Encounter: Payer: 59 | Admitting: Gastroenterology

## 2014-06-11 ENCOUNTER — Encounter: Payer: Self-pay | Admitting: Gastroenterology

## 2014-06-11 ENCOUNTER — Ambulatory Visit (AMBULATORY_SURGERY_CENTER): Payer: 59 | Admitting: Gastroenterology

## 2014-06-11 VITALS — BP 130/60 | HR 62 | Temp 96.7°F | Resp 31 | Ht 74.0 in | Wt 224.0 lb

## 2014-06-11 DIAGNOSIS — Z1211 Encounter for screening for malignant neoplasm of colon: Secondary | ICD-10-CM

## 2014-06-11 DIAGNOSIS — K639 Disease of intestine, unspecified: Secondary | ICD-10-CM

## 2014-06-11 DIAGNOSIS — Z800 Family history of malignant neoplasm of digestive organs: Secondary | ICD-10-CM

## 2014-06-11 DIAGNOSIS — IMO0002 Reserved for concepts with insufficient information to code with codable children: Secondary | ICD-10-CM

## 2014-06-11 MED ORDER — SODIUM CHLORIDE 0.9 % IV SOLN: 500.0000 mL | INTRAVENOUS | Status: DC

## 2014-06-11 NOTE — Progress Notes (Signed)
  [facility] Post-op Note  Patient: Thomas Livingston  Procedure(s) Performed: colonoscopy  Patient Location: [facility] - Recovery Area  Anesthesia Type: Deep Sedation/Propofol  Level of Consciousness: awake, oriented and patient cooperative  Airway and Oxygen Therapy: Patient Spontanous Breathing  Post-op Pain: none  Post-op Assessment:  Post-op Vital signs reviewed, Patient's Cardiovascular Status Stable, Respiratory Function Stable, Patent Airway, No signs of Nausea or vomiting and Pain level controlled  Post-op Vital Signs: Reviewed and stable  Complications: No apparent anesthesia complications  Staley Lunz E 10:48 AM

## 2014-06-11 NOTE — Op Note (Signed)
Portal  [address]. [place], [address]   COLONOSCOPY PROCEDURE REPORT  PATIENT: Thomas Livingston, Thomas Livingston  MR#: [id number] BIRTHDATE: [birth year] , 63  yrs. old GENDER: male ENDOSCOPIST: Ladene Artist, MD, [facility] PROCEDURE DATE:  06/11/2014 PROCEDURE:   Colonoscopy with biopsy First Screening Colonoscopy - Avg.  risk and is 50 yrs.  old or older - No.  Prior Negative Screening - Now for repeat screening. 10 or more years since last screening  History of Adenoma - Now for follow-up colonoscopy & has been > or = to 3 yrs.  N/A  Polyps Removed Today? Yes. ASA CLASS:   Class II INDICATIONS:average risk for colorectal cancer. MEDICATIONS: Monitored anesthesia care and Propofol 150 mg IV DESCRIPTION OF PROCEDURE:   After the risks benefits and alternatives of the procedure were thoroughly explained, informed consent was obtained.  The digital rectal exam revealed no abnormalities of the rectum.   The LB [id number]  endoscope was introduced through the anus and advanced to the cecum, which was identified by both the appendix and ileocecal valve. No adverse events experienced.   The quality of the prep was excellent, using MoviPrep  The instrument was then slowly withdrawn as the colon was fully examined.    COLON FINDINGS: There was mild diverticulosis noted in the sigmoid colon and transverse colon.   A round, firm and subepithelial 81mm nodule was located in the proximal rectum.  Multiple biopsies of the lesion were performed.  Retroflexed views revealed no abnormalities. The time to cecum=1 minutes 33 seconds.  Withdrawal time=10 minutes 34 seconds.  The scope was withdrawn and the procedure completed. COMPLICATIONS: There were no immediate complications.  ENDOSCOPIC IMPRESSION: 1.   Mild diverticulosis in the sigmoid colon and transverse colon 2.   An 40mm nodule in the rectum; multiple biopsies of the lesion were performed  RECOMMENDATIONS: 1.  Await  pathology results 2.  You should continue to follow colorectal cancer screening guidelines for "routine risk" patients with a repeat colonoscopy in 10 years.  There is no need for routine, screening FOBT (stool) testing for at least 5 years. 3.  High fiber diet with liberal fluid intake.  eSigned:  Ladene Artist, MD, [facility] 06/11/2014 10:47 AM

## 2014-06-11 NOTE — Progress Notes (Signed)
Called to room to assist during endoscopic procedure.  Patient ID and intended procedure confirmed with present staff. Received instructions for my participation in the procedure from the performing physician.  

## 2014-06-11 NOTE — Patient Instructions (Signed)
YOU HAD AN ENDOSCOPIC PROCEDURE TODAY AT THE [facility]: Refer to the procedure report that was given to you for any specific questions about what was found during the examination.  If the procedure report does not answer your questions, please call your gastroenterologist to clarify.  If you requested that your care partner not be given the details of your procedure findings, then the procedure report has been included in a sealed envelope for you to review at your convenience later.  YOU SHOULD EXPECT: Some feelings of bloating in the abdomen. Passage of more gas than usual.  Walking can help get rid of the air that was put into your GI tract during the procedure and reduce the bloating. If you had a lower endoscopy (such as a colonoscopy or flexible sigmoidoscopy) you may notice spotting of blood in your stool or on the toilet paper. If you underwent a bowel prep for your procedure, then you may not have a normal bowel movement for a few days.  DIET: Your first meal following the procedure should be a light meal and then it is ok to progress to your normal diet.  A half-sandwich or bowl of soup is an example of a good first meal.  Heavy or fried foods are harder to digest and may make you feel nauseous or bloated.  Likewise meals heavy in dairy and vegetables can cause extra gas to form and this can also increase the bloating.  Drink plenty of fluids but you should avoid alcoholic beverages for 24 hours.  ACTIVITY: Your care partner should take you home directly after the procedure.  You should plan to take it easy, moving slowly for the rest of the day.  You can resume normal activity the day after the procedure however you should NOT DRIVE or use heavy machinery for 24 hours (because of the sedation medicines used during the test).    SYMPTOMS TO REPORT IMMEDIATELY: A gastroenterologist can be reached at any hour.  During normal business hours, 8:30 AM to 5:00 PM Monday through Friday,  call [phone].  After hours and on weekends, please call the GI answering service at [phone] who will take a message and have the physician on call contact you.   Following lower endoscopy (colonoscopy or flexible sigmoidoscopy):  Excessive amounts of blood in the stool  Significant tenderness or worsening of abdominal pains  Swelling of the abdomen that is new, acute  Fever of 100F or higher  FOLLOW UP: If any biopsies were taken you will be contacted by phone or by letter within the next 1-3 weeks.  Call your gastroenterologist if you have not heard about the biopsies in 3 weeks.  Our staff will call the home number listed on your records the next business day following your procedure to check on you and address any questions or concerns that you may have at that time regarding the information given to you following your procedure. This is a courtesy call and so if there is no answer at the home number and we have not heard from you through the emergency physician on call, we will assume that you have returned to your regular daily activities without incident.  SIGNATURES/CONFIDENTIALITY: You and/or your care partner have signed paperwork which will be entered into your electronic medical record.  These signatures attest to the fact that that the information above on your After Visit Summary has been reviewed and is understood.  Full responsibility of the confidentiality of this   discharge information lies with you and/or your care-partner.  Await pathology  Please read over handouts about diverticulosis and high fiber diets  Push fluids  You might note some irritation in your nose or some drainage.  This may cause feels of congestion.  This is from the oxygen, which can be irritating.  There is no need for concern, this should clear up in a day or so

## 2014-06-12 ENCOUNTER — Telehealth: Payer: Self-pay | Admitting: *Deleted

## 2014-06-12 NOTE — Telephone Encounter (Signed)
  Follow up Call-  Call back number 06/11/2014  Post procedure Call Back phone  # [phone]  Permission to leave phone message Yes     Patient questions:  Do you have a fever, pain , or abdominal swelling? No. Pain Score  0 *  Have you tolerated food without any problems? Yes.    Have you been able to return to your normal activities? Yes.    Do you have any questions about your discharge instructions: Diet   No. Medications  No. Follow up visit  No.  Do you have questions or concerns about your Care? No.  Actions: * If pain score is 4 or above: No action needed, pain <4.

## 2014-06-19 ENCOUNTER — Telehealth: Payer: Self-pay

## 2014-06-19 NOTE — Telephone Encounter (Signed)
-----   Message from Milus Banister, MD sent at 06/18/2014  1:40 PM EST ----- Thomas Livingston  I looked at the colonoscopy images; certainly looks like a lipoma except for the fact that it felt firm. I think EUS could help with diagnosis. If you could let him know that we discussed this and he can expect a call from [facility] in the next few days but scheduling an endoscopic ultrasound if he is agreeable.  thanks  Ellise Kovack, Can you contact him later this week about scheduling the lower endoscopic ultrasound, moderate sedation would be fine, for rectal nodule. Next available endoscopic ultrasound Thursday.  thanks   ----- Message -----    From: Ladene Artist, MD    Sent: 06/18/2014   1:24 PM      To: Milus Banister, MD  Dad,  A round, firm and subepithelial 9mm nodule was located in the proximal rectum on this patient at recent colonoscopy. I was concerned about a carcinoid. Biopsies showed fat but the lesion was not typical for a lipoma. Could EUS help with a diagnosis?  Thomas Livingston

## 2014-06-20 ENCOUNTER — Other Ambulatory Visit: Payer: Self-pay

## 2014-06-20 DIAGNOSIS — K6289 Other specified diseases of anus and rectum: Secondary | ICD-10-CM

## 2014-06-20 NOTE — Telephone Encounter (Signed)
Lower EUS scheduled for pt need to putin correct instructions call Sharee Pimple and the pt.

## 2014-06-21 ENCOUNTER — Telehealth: Payer: Self-pay

## 2014-06-21 NOTE — Telephone Encounter (Signed)
We spoke, I answered all his questions.  He plans to continue ahead with the recommended EUS.

## 2014-06-21 NOTE — Telephone Encounter (Signed)
Dr Thomas Livingston the pt has many questions about the EUS scheduled I am unable to answer.  He would like a call to discuss.

## 2014-06-21 NOTE — Telephone Encounter (Signed)
The pt is aware, I will mail instructions to him but, he will call me back for further instructions when it is convenient for him.

## 2014-06-26 ENCOUNTER — Ambulatory Visit (INDEPENDENT_AMBULATORY_CARE_PROVIDER_SITE_OTHER): Payer: 59 | Admitting: Family Medicine

## 2014-06-26 ENCOUNTER — Encounter: Payer: Self-pay | Admitting: Family Medicine

## 2014-06-26 VITALS — BP 118/80 | HR 64 | Temp 97.6°F | Resp 16 | Ht 74.0 in | Wt 233.0 lb

## 2014-06-26 DIAGNOSIS — J329 Chronic sinusitis, unspecified: Secondary | ICD-10-CM

## 2014-06-26 DIAGNOSIS — J310 Chronic rhinitis: Secondary | ICD-10-CM

## 2014-06-26 MED ORDER — AMOXICILLIN-POT CLAVULANATE 875-125 MG PO TABS: 1.0000 | ORAL_TABLET | Freq: Two times a day (BID) | ORAL | Status: DC

## 2014-06-26 NOTE — Progress Notes (Signed)
Subjective:    Patient ID: Thomas Livingston, male    DOB: 11/19/1950, 64 y.o.   MRN: [id number]  HPI  Patient has had a upper respiratory infection for approximately 4-5 days. It is primarily been a hacking dry cough. Beginning this morning he developed severe sinus pressure in his frontal sinuses bilaterally. He rates the pain as 5 on a scale of 1-10. He does have rhinorrhea and postnasal drip. He denies any fever. He has no sinus tenderness to palpation on examination. He denies any vision changes or neurologic deficits. He denies any sore throat. He denies any otalgia. He denies any dizziness. He continues to have a dry nonproductive cough. Past Medical History  Diagnosis Date  . Hypertension   . Hyperlipidemia   . Allergy    Past Surgical History  Procedure Laterality Date  . Mouth surgery  1972  . Cholecystectomy  2007   Current Outpatient Prescriptions on File Prior to Visit  Medication Sig Dispense Refill  . CRESTOR 10 MG tablet Take 1 tablet by mouth  daily 90 tablet 0  . EPINEPHrine (EPIPEN) 0.3 mg/0.3 mL DEVI Inject 0.3 mLs (0.3 mg total) into the muscle once. 1 Device 1  . fexofenadine (ALLEGRA) 180 MG tablet Take 180 mg by mouth daily.    . fluticasone (FLONASE) 50 MCG/ACT nasal spray Use 2 sprays in each  nostril daily 48 g 3  . Multiple Vitamin (MULTIVITAMIN) tablet Take 1 tablet by mouth daily.    . naproxen sodium (ANAPROX) 220 MG tablet Take 440 mg by mouth daily.      No current facility-administered medications on file prior to visit.   Allergies  Allergen Reactions  . Lotrel [Amlodipine Besy-Benazepril Hcl] Hives and Swelling  . Chalk Other (See Comments)    sneezing   History   Social History  . Marital Status: Married    Spouse Name: N/A  . Number of Children: N/A  . Years of Education: N/A   Occupational History  . Not on file.   Social History Main Topics  . Smoking status: Never Smoker   . Smokeless tobacco: Never Used  . Alcohol Use: No  .  Drug Use: No  . Sexual Activity: Not on file   Other Topics Concern  . Not on file   Social History Narrative     Review of Systems  All other systems reviewed and are negative.      Objective:   Physical Exam  Constitutional: He appears well-developed and well-nourished. No distress.  HENT:  Right Ear: External ear normal.  Left Ear: External ear normal.  Nose: Mucosal edema and rhinorrhea present. Right sinus exhibits no maxillary sinus tenderness and no frontal sinus tenderness. Left sinus exhibits no maxillary sinus tenderness and no frontal sinus tenderness.  Mouth/Throat: Oropharynx is clear and moist. No oropharyngeal exudate.  Eyes: Conjunctivae are normal.  Neck: Neck supple.  Cardiovascular: Normal rate, regular rhythm and normal heart sounds.   Pulmonary/Chest: Effort normal and breath sounds normal. No respiratory distress. He has no wheezes. He has no rales.  Lymphadenopathy:    He has no cervical adenopathy.  Skin: He is not diaphoretic.  Vitals reviewed.         Assessment & Plan:  Rhinosinusitis - Plan: amoxicillin-clavulanate (AUGMENTIN) 875-125 MG per tablet  I believe the patient has a viral upper respiratory infection and is developing rhinosinusitis. However I still believe that this is a virus. I recommended Sudafed for congestion. I recommended Mucinex DM  for cough. I recommended nasal saline 4 times a day for congestion. I recommended ibuprofen for headache. Also recommended tincture of time. If symptoms worsen, I did give the patient a prescription for Augmentin with instructions on when to fill. However I anticipate gradual self limited resolution over the next 3-4 days.

## 2014-06-28 ENCOUNTER — Encounter (HOSPITAL_COMMUNITY): Payer: Self-pay | Admitting: *Deleted

## 2014-06-28 ENCOUNTER — Ambulatory Visit (HOSPITAL_COMMUNITY)
Admission: RE | Admit: 2014-06-28 | Discharge: 2014-06-28 | Disposition: A | Discharge status: Home / Self Care | Payer: 59 | Source: Ambulatory Visit | Attending: Gastroenterology | Admitting: Gastroenterology

## 2014-06-28 ENCOUNTER — Encounter (HOSPITAL_COMMUNITY)
Admission: RE | Disposition: A | Discharge status: Home / Self Care | Payer: Self-pay | Source: Ambulatory Visit | Attending: Gastroenterology

## 2014-06-28 DIAGNOSIS — E785 Hyperlipidemia, unspecified: Secondary | ICD-10-CM | POA: Diagnosis not present

## 2014-06-28 DIAGNOSIS — K639 Disease of intestine, unspecified: Secondary | ICD-10-CM | POA: Insufficient documentation

## 2014-06-28 DIAGNOSIS — I10 Essential (primary) hypertension: Secondary | ICD-10-CM | POA: Diagnosis not present

## 2014-06-28 DIAGNOSIS — Z9049 Acquired absence of other specified parts of digestive tract: Secondary | ICD-10-CM | POA: Insufficient documentation

## 2014-06-28 DIAGNOSIS — Z888 Allergy status to other drugs, medicaments and biological substances status: Secondary | ICD-10-CM | POA: Insufficient documentation

## 2014-06-28 DIAGNOSIS — K6289 Other specified diseases of anus and rectum: Secondary | ICD-10-CM

## 2014-06-28 HISTORY — PX: EUS: SHX5427

## 2014-06-28 SURGERY — ULTRASOUND, LOWER GI TRACT, ENDOSCOPIC
Anesthesia: Moderate Sedation

## 2014-06-28 MED ORDER — MIDAZOLAM HCL 10 MG/2ML IJ SOLN
INTRAMUSCULAR | Status: AC
  Filled 2014-06-28: qty 2

## 2014-06-28 MED ORDER — MIDAZOLAM HCL 10 MG/2ML IJ SOLN
INTRAMUSCULAR | Status: DC | PRN
Start: 2014-06-28 — End: 2014-06-28
  Administered 2014-06-28: 1 mg via INTRAVENOUS
  Administered 2014-06-28: 2 mg via INTRAVENOUS

## 2014-06-28 MED ORDER — DIPHENHYDRAMINE HCL 50 MG/ML IJ SOLN
INTRAMUSCULAR | Status: AC
  Filled 2014-06-28: qty 1

## 2014-06-28 MED ORDER — SODIUM CHLORIDE 0.9 % IV SOLN: INTRAVENOUS | Status: DC

## 2014-06-28 MED ORDER — FENTANYL CITRATE 0.05 MG/ML IJ SOLN
INTRAMUSCULAR | Status: AC
  Filled 2014-06-28: qty 2

## 2014-06-28 MED ORDER — FENTANYL CITRATE 0.05 MG/ML IJ SOLN
INTRAMUSCULAR | Status: DC | PRN
  Administered 2014-06-28: 12.5 ug via INTRAVENOUS
  Administered 2014-06-28: 25 ug via INTRAVENOUS

## 2014-06-28 NOTE — Op Note (Signed)
[facility] [place], [address]   ENDOSCOPIC ULTRASOUND PROCEDURE REPORT  PATIENT: Thomas Livingston, Thomas Livingston  MR#: [id number] BIRTHDATE: 04-Nov-1950  GENDER: male ENDOSCOPIST: Milus Banister, MD REFERRED BY:  Ladene Artist, M.D, [facility] PROCEDURE DATE:  06/28/2014 PROCEDURE:   Lower EUS ASA CLASS:      Class II INDICATIONS:   1.  Submucosal nodule noted in rectum on recent colonsocopy. MEDICATIONS: Fentanyl 37.5 mcg IV and Versed 3 mg IV  DESCRIPTION OF PROCEDURE:   After the risks benefits and alternatives of the procedure were  explained, informed consent was obtained. The patient was then placed in the left, lateral, decubitus postion and IV sedation was administered. Throughout the procedure, the patients blood pressure, pulse and oxygen saturations were monitored continuously.  Under direct visualization, the PENTAX EUS SCOPE  endoscope was introduced through the anus and advanced to the sigmoid colon .  Water was used as necessary to provide an acoustic interface.  Upon completion of the imaging, water was removed and the patient was sent to the recovery room in satisfactory condition.  Sigmoidoscopic findings: 1. Approximately 1cm firm nodule in anterior rectum, this was not soft.  There was evidence of recent mucosal biopsy at the site.  EUS findings: 1. The lesion above correpsonded with a 1.2cm isoechoic mass that involves the deep mucosa, submucosa layer of the rectal wall.  It appears to focally involve the muscularis propria layer but clearly does not invade through that layer. 2. No perirectal adenoapathy.  ENDOSCOPIC IMPRESSION: This is not a lipoma, it is most suspicious for a 1.2cm carcinoid lesion.  RECOMMENDATIONS: This should be resected, I recommend TEMS approach to assure completeness, negative margins.   _______________________________ eSignedMilus Banister, MD 06/28/2014 11:59 AM

## 2014-06-28 NOTE — Interval H&P Note (Signed)
History and Physical Interval Note:  06/28/2014 11:07 AM  Thomas Livingston  has presented today for surgery, with the diagnosis of rectal nodule   The various methods of treatment have been discussed with the patient and family. After consideration of risks, benefits and other options for treatment, the patient has consented to  Procedure(s): LOWER ENDOSCOPIC ULTRASOUND (EUS) (N/A) as a surgical intervention .  The patient's history has been reviewed, patient examined, no change in status, stable for surgery.  I have reviewed the patient's chart and labs.  Questions were answered to the patient's satisfaction.     Milus Banister

## 2014-06-28 NOTE — Discharge Instructions (Signed)
YOU HAD AN ENDOSCOPIC PROCEDURE TODAY: Refer to the procedure report that was given to you for any specific questions about what was found during the examination.  If the procedure report does not answer your questions, please call your gastroenterologist to clarify. ° °YOU SHOULD EXPECT: Some feelings of bloating in the abdomen. Passage of more gas than usual.  Walking can help get rid of the air that was put into your GI tract during the procedure and reduce the bloating. If you had a lower endoscopy (such as a colonoscopy or flexible sigmoidoscopy) you may notice spotting of blood in your stool or on the toilet paper.  ° °DIET: Your first meal following the procedure should be a light meal and then it is ok to progress to your normal diet.  A half-sandwich or bowl of soup is an example of a good first meal.  Heavy or fried foods are harder to digest and may make you feel nasueas or bloated.  Drink plenty of fluids but you should avoid alcoholic beverages for 24 hours. ° °ACTIVITY: Your care partner should take you home directly after the procedure.  You should plan to take it easy, moving slowly for the rest of the day.  You can resume normal activity the day after the procedure however you should NOT DRIVE or use heavy machinery for 24 hours (because of the sedation medicines used during the test).   ° °SYMPTOMS TO REPORT IMMEDIATELY  °A gastroenterologist can be reached at any hour.  Please call your doctor's office for any of the following symptoms: ° °· Following lower endoscopy (colonoscopy, flexible sigmoidoscopy) ° Excessive amounts of blood in the stool ° Significant tenderness, worsening of abdominal pains ° Swelling of the abdomen that is new, acute ° Fever of 100° or higher °· Following upper endoscopy (EGD, EUS, ERCP) ° Vomiting of blood or coffee ground material ° New, significant abdominal pain ° New, significant chest pain or pain under the shoulder blades ° Painful or persistently difficult  swallowing ° New shortness of breath ° Black, tarry-looking stools ° °FOLLOW UP: °If any biopsies were taken you will be contacted by phone or by letter within the next 1-3 weeks.  Call your gastroenterologist if you have not heard about the biopsies in 3 weeks.  °Please also call your gastroenterologist's office with any specific questions about appointments or follow up tests. ° °Conscious Sedation, Adult, Care After °Refer to this sheet in the next few weeks. These instructions provide you with information on caring for yourself after your procedure. Your health care provider may also give you more specific instructions. Your treatment has been planned according to current medical practices, but problems sometimes occur. Call your health care provider if you have any problems or questions after your procedure. °WHAT TO EXPECT AFTER THE PROCEDURE  °After your procedure: °· You may feel sleepy, clumsy, and have poor balance for several hours. °· Vomiting may occur if you eat too soon after the procedure. °HOME CARE INSTRUCTIONS °· Do not participate in any activities where you could become injured for at least 24 hours. Do not: °¨ Drive. °¨ Swim. °¨ Ride a bicycle. °¨ Operate heavy machinery. °¨ Cook. °¨ Use power tools. °¨ Climb ladders. °¨ Work from a high place. °· Do not make important decisions or sign legal documents until you are improved. °· If you vomit, drink water, juice, or soup when you can drink without vomiting. Make sure you have little or no nausea before eating   solid foods. °· Only take over-the-counter or prescription medicines for pain, discomfort, or fever as directed by your health care provider. °· Make sure you and your family fully understand everything about the medicines given to you, including what side effects may occur. °· You should not drink alcohol, take sleeping pills, or take medicines that cause drowsiness for at least 24 hours. °· If you smoke, do not smoke without  supervision. °· If you are feeling better, you may resume normal activities 24 hours after you were sedated. °· Keep all appointments with your health care provider. °SEEK MEDICAL CARE IF: °· Your skin is pale or bluish in color. °· You continue to feel nauseous or vomit. °· Your pain is getting worse and is not helped by medicine. °· You have bleeding or swelling. °· You are still sleepy or feeling clumsy after 24 hours. °SEEK IMMEDIATE MEDICAL CARE IF: °· You develop a rash. °· You have difficulty breathing. °· You develop any type of allergic problem. °· You have a fever. °MAKE SURE YOU: °· Understand these instructions. °· Will watch your condition. °· Will get help right away if you are not doing well or get worse. °Document Released: 02/15/2013 Document Reviewed: 02/15/2013 °[employer]® Patient Information ©2015 [employer]. This information is not intended to replace advice given to you by your health care provider. Make sure you discuss any questions you have with your health care provider. ° °

## 2014-06-28 NOTE — H&P (View-Only) (Signed)
Subjective:    Patient ID: Thomas Livingston, male    DOB: 06-06-1950, 64 y.o.   MRN: [id number]  HPI  Patient has had a upper respiratory infection for approximately 4-5 days. It is primarily been a hacking dry cough. Beginning this morning he developed severe sinus pressure in his frontal sinuses bilaterally. He rates the pain as 5 on a scale of 1-10. He does have rhinorrhea and postnasal drip. He denies any fever. He has no sinus tenderness to palpation on examination. He denies any vision changes or neurologic deficits. He denies any sore throat. He denies any otalgia. He denies any dizziness. He continues to have a dry nonproductive cough. Past Medical History  Diagnosis Date  . Hypertension   . Hyperlipidemia   . Allergy    Past Surgical History  Procedure Laterality Date  . Mouth surgery  1972  . Cholecystectomy  2007   Current Outpatient Prescriptions on File Prior to Visit  Medication Sig Dispense Refill  . CRESTOR 10 MG tablet Take 1 tablet by mouth  daily 90 tablet 0  . EPINEPHrine (EPIPEN) 0.3 mg/0.3 mL DEVI Inject 0.3 mLs (0.3 mg total) into the muscle once. 1 Device 1  . fexofenadine (ALLEGRA) 180 MG tablet Take 180 mg by mouth daily.    . fluticasone (FLONASE) 50 MCG/ACT nasal spray Use 2 sprays in each  nostril daily 48 g 3  . Multiple Vitamin (MULTIVITAMIN) tablet Take 1 tablet by mouth daily.    . naproxen sodium (ANAPROX) 220 MG tablet Take 440 mg by mouth daily.      No current facility-administered medications on file prior to visit.   Allergies  Allergen Reactions  . Lotrel [Amlodipine Besy-Benazepril Hcl] Hives and Swelling  . Chalk Other (See Comments)    sneezing   History   Social History  . Marital Status: Married    Spouse Name: N/A  . Number of Children: N/A  . Years of Education: N/A   Occupational History  . Not on file.   Social History Main Topics  . Smoking status: Never Smoker   . Smokeless tobacco: Never Used  . Alcohol Use: No  .  Drug Use: No  . Sexual Activity: Not on file   Other Topics Concern  . Not on file   Social History Narrative     Review of Systems  All other systems reviewed and are negative.      Objective:   Physical Exam  Constitutional: He appears well-developed and well-nourished. No distress.  HENT:  Right Ear: External ear normal.  Left Ear: External ear normal.  Nose: Mucosal edema and rhinorrhea present. Right sinus exhibits no maxillary sinus tenderness and no frontal sinus tenderness. Left sinus exhibits no maxillary sinus tenderness and no frontal sinus tenderness.  Mouth/Throat: Oropharynx is clear and moist. No oropharyngeal exudate.  Eyes: Conjunctivae are normal.  Neck: Neck supple.  Cardiovascular: Normal rate, regular rhythm and normal heart sounds.   Pulmonary/Chest: Effort normal and breath sounds normal. No respiratory distress. He has no wheezes. He has no rales.  Lymphadenopathy:    He has no cervical adenopathy.  Skin: He is not diaphoretic.  Vitals reviewed.         Assessment & Plan:  Rhinosinusitis - Plan: amoxicillin-clavulanate (AUGMENTIN) 875-125 MG per tablet  I believe the patient has a viral upper respiratory infection and is developing rhinosinusitis. However I still believe that this is a virus. I recommended Sudafed for congestion. I recommended Mucinex DM  for cough. I recommended nasal saline 4 times a day for congestion. I recommended ibuprofen for headache. Also recommended tincture of time. If symptoms worsen, I did give the patient a prescription for Augmentin with instructions on when to fill. However I anticipate gradual self limited resolution over the next 3-4 days.

## 2014-06-29 ENCOUNTER — Encounter (HOSPITAL_COMMUNITY): Payer: Self-pay | Admitting: Gastroenterology

## 2014-07-03 ENCOUNTER — Telehealth: Payer: Self-pay | Admitting: *Deleted

## 2014-07-03 MED ORDER — BENZONATATE 200 MG PO CAPS: 200.0000 mg | ORAL_CAPSULE | Freq: Three times a day (TID) | ORAL | Status: DC | PRN

## 2014-07-03 NOTE — Telephone Encounter (Signed)
Med sent to pharm and pt aware 

## 2014-07-03 NOTE — Telephone Encounter (Signed)
tessaoln perles 200 q 8 hrs prn 30

## 2014-07-03 NOTE — Telephone Encounter (Signed)
Pt called stating he is having a cough and so bad it is waking him up at night and can not get a good nights sleep, wants to know if you can prescribe him a cough supressant that will relieve it, states does not want anything that has codeine in it, Please advise.  [facility]

## 2014-07-10 ENCOUNTER — Telehealth: Payer: Self-pay | Admitting: Gastroenterology

## 2014-07-10 NOTE — Telephone Encounter (Signed)
Yes, I thought Dr. Ardis Hughs told me he placed a referral to [facility] for TEMS removal of this lesion. Please check with [facility] and make the referral.

## 2014-07-10 NOTE — Telephone Encounter (Signed)
Dr. Fuller Plan please advise on message from the patient.  Have you reviewed EUS?

## 2014-07-10 NOTE — Telephone Encounter (Signed)
Patient has been scheduled with [facility] for a TEMS approach with Dr. Marcello Moores at [place].  07/31/14 arrive at 10:00 for a 10:30. Patient notified of appt date an time.

## 2014-07-31 ENCOUNTER — Other Ambulatory Visit: Payer: Self-pay | Admitting: General Surgery

## 2014-07-31 NOTE — H&P (Signed)
Thomas Livingston 07/31/2014 10:37 AM Location: [facility] Patient #: [id number] DOB: 1950/12/11 Married / Language: [name] / Race: White Male History of Present Illness Leighton Ruff MD; 0/45/4098 12:58 PM) Patient words: rectal mass.  The patient is a 64 year old male who presents with a colonic polyp. Patient presents to the office with a rectal mass seen on screening colonoscopy performed by Dr. Fuller Plan. Biopsies were negative for any abnormalities. The mass appeared to be submucosal. He underwent a endoscopic ultrasound with Dr. Ardis Hughs which showed a approximately 1 cm mass in the anterior rectum which appears to be near one of the rectal folds. Patient denies any symptoms. He is not having any blood per rectum. He denies any rectal pain. Other Problems [name], CMA; 07/31/2014 10:38 AM) Arthritis Cholelithiasis High blood pressure Hypercholesterolemia  Past Surgical History [name], CMA; 07/31/2014 10:38 AM) Gallbladder Surgery - Laparoscopic Oral Surgery Vasectomy  Diagnostic Studies History [name], CMA; 07/31/2014 10:38 AM) Colonoscopy 5-10 years ago  Allergies [name], CMA; 07/31/2014 10:39 AM) No Known Drug Allergies 07/31/2014 (Marked as Inactive) Lotrel *ANTIHYPERTENSIVES*  Medication History [name], CMA; 07/31/2014 10:40 AM) Amoxicillin-Pot Clavulanate (875-125MG  Tablet, Oral) Active. Benzonatate (200MG  Capsule, Oral) Active. Crestor (10MG  Tablet, Oral) Active. Fluticasone Propionate (50MCG/ACT Suspension, Nasal) Active. MoviPrep (100GM For Solution, Oral) Active. Flonase Allergy Relief (50MCG/ACT Suspension, Nasal) Active. Epi E-Z Pen Jr (1:2000 Device, Injection) Active. Naproxen Sodium (220MG  Tablet, Oral) Active. Allegra (180MG  Tablet, Oral) Active. Medications Reconciled  Social History [name], [place]; 07/31/2014 10:38 AM) Caffeine use Carbonated beverages, Coffee, Tea. No alcohol use No drug  use Tobacco use Never smoker.  Family History [name], [place]; 07/31/2014 10:38 AM) Cancer Father, Mother. Respiratory Condition Father. Thyroid problems Mother.     Review of Systems [name] CMA; 07/31/2014 10:38 AM) General Not Present- Appetite Loss, Chills, Fatigue, Fever, Night Sweats, Weight Gain and Weight Loss. Skin Not Present- Change in Wart/Mole, Dryness, Hives, Jaundice, New Lesions, Non-Healing Wounds, Rash and Ulcer. HEENT Present- Seasonal Allergies and Wears glasses/contact lenses. Not Present- Earache, Hearing Loss, Hoarseness, Nose Bleed, Oral Ulcers, Ringing in the Ears, Sinus Pain, Sore Throat, Visual Disturbances and Yellow Eyes. Respiratory Present- Chronic Cough and Snoring. Not Present- Bloody sputum, Difficulty Breathing and Wheezing. Breast Not Present- Breast Mass, Breast Pain, Nipple Discharge and Skin Changes. Cardiovascular Not Present- Chest Pain, Difficulty Breathing Lying Down, Leg Cramps, Palpitations, Rapid Heart Rate, Shortness of Breath and Swelling of Extremities. Gastrointestinal Not Present- Abdominal Pain, Bloating, Bloody Stool, Change in Bowel Habits, Chronic diarrhea, Constipation, Difficulty Swallowing, Excessive gas, Gets full quickly at meals, Hemorrhoids, Indigestion, Nausea, Rectal Pain and Vomiting. Male Genitourinary Not Present- Blood in Urine, Change in Urinary Stream, Frequency, Impotence, Nocturia, Painful Urination, Urgency and Urine Leakage. Musculoskeletal Present- Joint Pain. Not Present- Back Pain, Joint Stiffness, Muscle Pain, Muscle Weakness and Swelling of Extremities. Neurological Not Present- Decreased Memory, Fainting, Headaches, Numbness, Seizures, Tingling, Tremor, Trouble walking and Weakness. Psychiatric Not Present- Anxiety, Bipolar, Change in Sleep Pattern, Depression, Fearful and Frequent crying. Endocrine Not Present- Cold Intolerance, Excessive Hunger, Hair Changes, Heat Intolerance, Hot flashes and New  Diabetes. Hematology Not Present- Easy Bruising, Excessive bleeding, Gland problems, HIV and Persistent Infections.  Vitals [name] CMA; 07/31/2014 10:41 AM) 07/31/2014 10:40 AM Weight: 228 lb Height: 74in Body Surface Area: 2.32 m Body Mass Index: 29.27 kg/m Temp.: 97.70F(Temporal)  Pulse: 68 (Regular)  Resp.: 15 (Unlabored)  BP: 130/78 (Sitting, Left Arm, Standard)     Physical Exam Elmo Putt  Marcello Moores MD; 07/31/2014 12:58 PM)  General Mental Status-Alert. General Appearance-Consistent with stated age. Hydration-Well hydrated. Voice-Normal.  Head and Neck Head-normocephalic, atraumatic with no lesions or palpable masses. Trachea-midline. Thyroid Gland Characteristics - normal size and consistency.  Eye Eyeball - Bilateral-Extraocular movements intact. Sclera/Conjunctiva - Bilateral-No scleral icterus.  Chest and Lung Exam Chest and lung exam reveals -quiet, even and easy respiratory effort with no use of accessory muscles and on auscultation, normal breath sounds, no adventitious sounds and normal vocal resonance. Inspection Chest Wall - Normal. Back - normal.  Cardiovascular Cardiovascular examination reveals -normal heart sounds, regular rate and rhythm with no murmurs and normal pedal pulses bilaterally.  Abdomen Inspection Inspection of the abdomen reveals - No Hernias. Palpation/Percussion Palpation and Percussion of the abdomen reveal - Soft, Non Tender, No Rebound tenderness, No Rigidity (guarding) and No hepatosplenomegaly. Auscultation Auscultation of the abdomen reveals - Bowel sounds normal.  Rectal Anorectal Exam External - normal external exam. Internal - normal internal exam. Note: No masses palpated.  Neurologic Neurologic evaluation reveals -alert and oriented x 3 with no impairment of recent or remote memory. Mental Status-Normal.  Musculoskeletal Global Assessment -Note:no gross deformities.  Normal  Exam - Left-Upper Extremity Strength Normal and Lower Extremity Strength Normal. Normal Exam - Right-Upper Extremity Strength Normal and Lower Extremity Strength Normal.    Assessment & Plan Leighton Ruff MD; 8/41/3244 1:00 PM)  RECTAL TUMOR (239.0  D49.0) Story: Rectal mass, possible carcinoma. Impression: On exam I cannot palpate a mass. I suspected thin the mid rectum. On colonoscopy reports it appears that the mass is anterior in nature. I have recommended a transanal minimally invasive surgery. We discussed this in detail. We discussed that this will require full thickness removal of the wall of his rectum with a sutured closure. He will do a rectal bowel prep prior to the procedure and will require preoperative antibiotics. I suspect that this will be a outpatient procedure with overnight observation. We discussed the risk of the procedure which are recurrent bleeding and perforation of the colon with anastomotic leak.

## 2014-08-04 ENCOUNTER — Other Ambulatory Visit: Payer: Self-pay | Admitting: Family Medicine

## 2014-08-15 ENCOUNTER — Other Ambulatory Visit (HOSPITAL_COMMUNITY): Payer: Self-pay | Admitting: *Deleted

## 2014-08-15 NOTE — Patient Instructions (Addendum)
Thomas Livingston  08/15/2014   Your procedure is scheduled on: Monday 08-27-14  Report to [facility] Main  Entrance and follow signs to               [place] at 830 AM.  Call this number if you have problems the morning of surgery [phone]   Remember:  Do not eat food or drink liquids :After Midnight.     Take these medicines the morning of surgery with A SIP OF WATER:                                You may not have any metal on your body including hair pins and              piercings  Do not wear jewelry, make-up, lotions, powders or perfumes.             Do not wear nail polish.  Do not shave  48 hours prior to surgery.              Men may shave face and neck.   Do not bring valuables to the hospital. [place].  Contacts, dentures or bridgework may not be worn into surgery.  Leave suitcase in the car. After surgery it may be brought to your room.     Patients discharged the day of surgery will not be allowed to drive home.  Name and phone number of your driver:  Special Instructions: N/A              Please read over the following fact sheets you were given: _____________________________________________________________________             [facility] - Preparing for Surgery Before surgery, you can play an important role.  Because skin is not sterile, your skin needs to be as free of germs as possible.  You can reduce the number of germs on your skin by washing with CHG (chlorahexidine gluconate) soap before surgery.  CHG is an antiseptic cleaner which kills germs and bonds with the skin to continue killing germs even after washing. Please DO NOT use if you have an allergy to CHG or antibacterial soaps.  If your skin becomes reddened/irritated stop using the CHG and inform your nurse when you arrive at Short Stay. Do not shave (including legs and underarms) for at least 48 hours prior to the  first CHG shower.  You may shave your face/neck. Please follow these instructions carefully:  1.  Shower with CHG Soap the night before surgery and the  morning of Surgery.  2.  If you choose to wash your hair, wash your hair first as usual with your  normal  shampoo.  3.  After you shampoo, rinse your hair and body thoroughly to remove the  shampoo.                           4.  Use CHG as you would any other liquid soap.  You can apply chg directly  to the skin and wash                       Gently  with a scrungie or clean washcloth.  5.  Apply the CHG Soap to your body ONLY FROM THE NECK DOWN.   Do not use on face/ open                           Wound or open sores. Avoid contact with eyes, ears mouth and genitals (private parts).                       Wash face,  Genitals (private parts) with your normal soap.             6.  Wash thoroughly, paying special attention to the area where your surgery  will be performed.  7.  Thoroughly rinse your body with warm water from the neck down.  8.  DO NOT shower/wash with your normal soap after using and rinsing off  the CHG Soap.                9.  Pat yourself dry with a clean towel.            10.  Wear clean pajamas.            11.  Place clean sheets on your bed the night of your first shower and do not  sleep with pets. Day of Surgery : Do not apply any lotions/deodorants the morning of surgery.  Please wear clean clothes to the hospital/surgery center.  FAILURE TO FOLLOW THESE INSTRUCTIONS MAY RESULT IN THE CANCELLATION OF YOUR SURGERY PATIENT SIGNATURE_________________________________  NURSE SIGNATURE__________________________________  ________________________________________________________________________

## 2014-08-16 ENCOUNTER — Encounter (HOSPITAL_COMMUNITY): Payer: Self-pay

## 2014-08-16 ENCOUNTER — Encounter (HOSPITAL_COMMUNITY)
Admission: RE | Admit: 2014-08-16 | Discharge: 2014-08-16 | Disposition: A | Discharge status: Home / Self Care | Payer: 59 | Source: Ambulatory Visit | Attending: General Surgery | Admitting: General Surgery

## 2014-08-16 DIAGNOSIS — Z01812 Encounter for preprocedural laboratory examination: Secondary | ICD-10-CM | POA: Insufficient documentation

## 2014-08-16 DIAGNOSIS — Z01810 Encounter for preprocedural cardiovascular examination: Secondary | ICD-10-CM | POA: Diagnosis not present

## 2014-08-16 HISTORY — DX: Psoriasis, unspecified: L40.9

## 2014-08-16 HISTORY — DX: Other nonspecific abnormal finding of lung field: R91.8

## 2014-08-16 HISTORY — DX: Unspecified osteoarthritis, unspecified site: M19.90

## 2014-08-16 LAB — CBC
HCT: 43.7 % (ref 39.0–52.0)
Hemoglobin: 14.6 g/dL (ref 13.0–17.0)
MCH: 30.9 pg (ref 26.0–34.0)
MCHC: 33.4 g/dL (ref 30.0–36.0)
MCV: 92.4 fL (ref 78.0–100.0)
Platelets: 236 10*3/uL (ref 150–400)
RBC: 4.73 MIL/uL (ref 4.22–5.81)
RDW: 12.5 % (ref 11.5–15.5)
WBC: 6.2 10*3/uL (ref 4.0–10.5)

## 2014-08-16 LAB — BASIC METABOLIC PANEL
Anion gap: 9 (ref 5–15)
BUN: 16 mg/dL (ref 6–23)
CO2: 27 mmol/L (ref 19–32)
Calcium: 9.2 mg/dL (ref 8.4–10.5)
Chloride: 102 mmol/L (ref 96–112)
Creatinine, Ser: 0.85 mg/dL (ref 0.50–1.35)
GFR calc Af Amer: >90 mL/min (ref >90)
GFR calc non Af Amer: >90 mL/min (ref >90)
Glucose, Bld: 95 mg/dL (ref 70–99)
Potassium: 4.1 mmol/L (ref 3.5–5.1)
Sodium: 138 mmol/L (ref 135–145)

## 2014-08-27 ENCOUNTER — Observation Stay (HOSPITAL_COMMUNITY)
Admission: RE | Admit: 2014-08-27 | Discharge: 2014-08-28 | Disposition: A | Discharge status: Home / Self Care | Payer: 59 | Source: Ambulatory Visit | Attending: General Surgery | Admitting: General Surgery

## 2014-08-27 ENCOUNTER — Ambulatory Visit (HOSPITAL_COMMUNITY): Payer: 59 | Admitting: Anesthesiology

## 2014-08-27 ENCOUNTER — Encounter (HOSPITAL_COMMUNITY)
Admission: RE | Disposition: A | Discharge status: Home / Self Care | Payer: Self-pay | Source: Ambulatory Visit | Attending: General Surgery

## 2014-08-27 ENCOUNTER — Encounter (HOSPITAL_COMMUNITY): Payer: Self-pay | Admitting: *Deleted

## 2014-08-27 DIAGNOSIS — Z79899 Other long term (current) drug therapy: Secondary | ICD-10-CM | POA: Insufficient documentation

## 2014-08-27 DIAGNOSIS — E780 Pure hypercholesterolemia: Secondary | ICD-10-CM | POA: Diagnosis not present

## 2014-08-27 DIAGNOSIS — Z791 Long term (current) use of non-steroidal anti-inflammatories (NSAID): Secondary | ICD-10-CM | POA: Insufficient documentation

## 2014-08-27 DIAGNOSIS — D1779 Benign lipomatous neoplasm of other sites: Principal | ICD-10-CM | POA: Insufficient documentation

## 2014-08-27 DIAGNOSIS — M1990 Unspecified osteoarthritis, unspecified site: Secondary | ICD-10-CM | POA: Diagnosis not present

## 2014-08-27 DIAGNOSIS — K635 Polyp of colon: Secondary | ICD-10-CM | POA: Diagnosis present

## 2014-08-27 DIAGNOSIS — K6289 Other specified diseases of anus and rectum: Secondary | ICD-10-CM | POA: Diagnosis present

## 2014-08-27 DIAGNOSIS — I10 Essential (primary) hypertension: Secondary | ICD-10-CM | POA: Insufficient documentation

## 2014-08-27 HISTORY — PX: TRANSANAL RECTAL RESECTION: SHX2562

## 2014-08-27 HISTORY — PX: PROCTOSCOPY: SHX2266

## 2014-08-27 SURGERY — PROCTOSCOPY
Anesthesia: General | Site: Rectum

## 2014-08-27 MED ORDER — ROSUVASTATIN CALCIUM 10 MG PO TABS
10.0000 mg | ORAL_TABLET | Freq: Every day | ORAL | Status: DC
  Administered 2014-08-27: 10 mg via ORAL
  Filled 2014-08-27 (×2): qty 1

## 2014-08-27 MED ORDER — GLYCOPYRROLATE 0.2 MG/ML IJ SOLN
INTRAMUSCULAR | Status: DC | PRN
  Administered 2014-08-27: .5 mg via INTRAVENOUS

## 2014-08-27 MED ORDER — EPHEDRINE SULFATE 50 MG/ML IJ SOLN
INTRAMUSCULAR | Status: DC | PRN
  Administered 2014-08-27 (×2): 10 mg via INTRAVENOUS
  Administered 2014-08-27 (×2): 5 mg via INTRAVENOUS
  Administered 2014-08-27 (×2): 10 mg via INTRAVENOUS

## 2014-08-27 MED ORDER — GLYCOPYRROLATE 0.2 MG/ML IJ SOLN
INTRAMUSCULAR | Status: AC
  Filled 2014-08-27: qty 3

## 2014-08-27 MED ORDER — EPHEDRINE SULFATE 50 MG/ML IJ SOLN
INTRAMUSCULAR | Status: AC
  Filled 2014-08-27: qty 1

## 2014-08-27 MED ORDER — MORPHINE SULFATE 2 MG/ML IJ SOLN: 2.0000 mg | INTRAMUSCULAR | Status: DC | PRN

## 2014-08-27 MED ORDER — ONDANSETRON HCL 4 MG/2ML IJ SOLN: 4.0000 mg | Freq: Four times a day (QID) | INTRAMUSCULAR | Status: DC | PRN

## 2014-08-27 MED ORDER — SODIUM CHLORIDE 0.9 % IV SOLN: INTRAVENOUS | Status: DC

## 2014-08-27 MED ORDER — FENTANYL CITRATE (PF) 100 MCG/2ML IJ SOLN
INTRAMUSCULAR | Status: DC | PRN
  Administered 2014-08-27: 50 ug via INTRAVENOUS
  Administered 2014-08-27: 100 ug via INTRAVENOUS

## 2014-08-27 MED ORDER — SODIUM CHLORIDE 0.9 % IV SOLN
1.0000 g | INTRAVENOUS | Status: AC
  Administered 2014-08-27: 1 g via INTRAVENOUS
  Filled 2014-08-27: qty 1

## 2014-08-27 MED ORDER — SUCCINYLCHOLINE CHLORIDE 20 MG/ML IJ SOLN
INTRAMUSCULAR | Status: DC | PRN
  Administered 2014-08-27: 100 mg via INTRAVENOUS

## 2014-08-27 MED ORDER — LIDOCAINE HCL (CARDIAC) 20 MG/ML IV SOLN
INTRAVENOUS | Status: AC
  Filled 2014-08-27: qty 5

## 2014-08-27 MED ORDER — 0.9 % SODIUM CHLORIDE (POUR BTL) OPTIME
TOPICAL | Status: DC | PRN
  Administered 2014-08-27: 2000 mL

## 2014-08-27 MED ORDER — CISATRACURIUM BESYLATE 20 MG/10ML IV SOLN
INTRAVENOUS | Status: AC
  Filled 2014-08-27: qty 10

## 2014-08-27 MED ORDER — NEOSTIGMINE METHYLSULFATE 10 MG/10ML IV SOLN
INTRAVENOUS | Status: DC | PRN
  Administered 2014-08-27: 5 mg via INTRAVENOUS

## 2014-08-27 MED ORDER — PROPOFOL 10 MG/ML IV BOLUS
INTRAVENOUS | Status: AC
Start: 2014-08-27 — End: 2014-08-27
  Filled 2014-08-27: qty 20

## 2014-08-27 MED ORDER — LACTATED RINGERS IV SOLN
INTRAVENOUS | Status: DC
  Administered 2014-08-27: 1000 mL via INTRAVENOUS
  Administered 2014-08-27: 13:00:00 via INTRAVENOUS

## 2014-08-27 MED ORDER — METOCLOPRAMIDE HCL 5 MG/ML IJ SOLN
INTRAMUSCULAR | Status: AC
  Filled 2014-08-27: qty 2

## 2014-08-27 MED ORDER — DEXAMETHASONE SODIUM PHOSPHATE 10 MG/ML IJ SOLN
INTRAMUSCULAR | Status: AC
  Filled 2014-08-27: qty 1

## 2014-08-27 MED ORDER — ONDANSETRON HCL 4 MG PO TABS: 4.0000 mg | ORAL_TABLET | Freq: Four times a day (QID) | ORAL | Status: DC | PRN

## 2014-08-27 MED ORDER — LIDOCAINE HCL (CARDIAC) 20 MG/ML IV SOLN
INTRAVENOUS | Status: DC | PRN
  Administered 2014-08-27: 80 mg via INTRAVENOUS

## 2014-08-27 MED ORDER — OXYCODONE-ACETAMINOPHEN 5-325 MG PO TABS
1.0000 | ORAL_TABLET | ORAL | Status: DC | PRN
  Administered 2014-08-28: 1 via ORAL
  Filled 2014-08-27: qty 1

## 2014-08-27 MED ORDER — BUPIVACAINE-EPINEPHRINE 0.25% -1:200000 IJ SOLN
INTRAMUSCULAR | Status: DC | PRN
  Administered 2014-08-27: 20 mL

## 2014-08-27 MED ORDER — METOCLOPRAMIDE HCL 5 MG/ML IJ SOLN
INTRAMUSCULAR | Status: DC | PRN
  Administered 2014-08-27: 10 mg via INTRAVENOUS

## 2014-08-27 MED ORDER — BUPIVACAINE-EPINEPHRINE (PF) 0.25% -1:200000 IJ SOLN
INTRAMUSCULAR | Status: AC
  Filled 2014-08-27: qty 30

## 2014-08-27 MED ORDER — ONDANSETRON HCL 4 MG/2ML IJ SOLN
INTRAMUSCULAR | Status: AC
  Filled 2014-08-27: qty 2

## 2014-08-27 MED ORDER — CISATRACURIUM BESYLATE (PF) 10 MG/5ML IV SOLN
INTRAVENOUS | Status: DC | PRN
  Administered 2014-08-27: 2 mg via INTRAVENOUS
  Administered 2014-08-27: 6 mg via INTRAVENOUS

## 2014-08-27 MED ORDER — PROMETHAZINE HCL 25 MG/ML IJ SOLN: 6.2500 mg | INTRAMUSCULAR | Status: DC | PRN

## 2014-08-27 MED ORDER — HYDROMORPHONE HCL 1 MG/ML IJ SOLN: 0.2500 mg | INTRAMUSCULAR | Status: DC | PRN

## 2014-08-27 MED ORDER — PROPOFOL 10 MG/ML IV BOLUS
INTRAVENOUS | Status: DC | PRN
  Administered 2014-08-27: 200 mg via INTRAVENOUS

## 2014-08-27 MED ORDER — IBUPROFEN 200 MG PO TABS: 600.0000 mg | ORAL_TABLET | Freq: Four times a day (QID) | ORAL | Status: DC | PRN

## 2014-08-27 MED ORDER — KETOROLAC TROMETHAMINE 30 MG/ML IJ SOLN: 30.0000 mg | Freq: Once | INTRAMUSCULAR | Status: DC | PRN

## 2014-08-27 MED ORDER — MIDAZOLAM HCL 2 MG/2ML IJ SOLN
INTRAMUSCULAR | Status: AC
  Filled 2014-08-27: qty 2

## 2014-08-27 MED ORDER — NEOSTIGMINE METHYLSULFATE 10 MG/10ML IV SOLN
INTRAVENOUS | Status: AC
  Filled 2014-08-27: qty 1

## 2014-08-27 MED ORDER — NAPROXEN SODIUM 275 MG PO TABS
550.0000 mg | ORAL_TABLET | Freq: Every day | ORAL | Status: DC
  Administered 2014-08-27 – 2014-08-28 (×2): 550 mg via ORAL
  Filled 2014-08-27 (×2): qty 2

## 2014-08-27 MED ORDER — FENTANYL CITRATE (PF) 250 MCG/5ML IJ SOLN
INTRAMUSCULAR | Status: AC
  Filled 2014-08-27: qty 5

## 2014-08-27 MED ORDER — MIDAZOLAM HCL 5 MG/5ML IJ SOLN
INTRAMUSCULAR | Status: DC | PRN
  Administered 2014-08-27: 2 mg via INTRAVENOUS

## 2014-08-27 SURGICAL SUPPLY — 82 items
BLADE EXTENDED COATED 6.5IN (ELECTRODE) IMPLANT
BLADE HEX COATED 2.75 (ELECTRODE) ×2 IMPLANT
BLADE SURG 15 STRL LF DISP TIS (BLADE) ×1 IMPLANT
BLADE SURG 15 STRL SS (BLADE) ×2
BLADE SURG SZ10 CARB STEEL (BLADE) IMPLANT
BRIEF STRETCH FOR OB PAD LRG (UNDERPADS AND DIAPERS) IMPLANT
CATH FOLEY 3WAY 30CC 26FR (CATHETERS) IMPLANT
CATH FOLEY SILVER 30CC 26FR (CATHETERS) IMPLANT
CLIP SUT LAPRA TY ABSORB (SUTURE) IMPLANT
COVER MAYO STAND STRL (DRAPES) IMPLANT
DECANTER SPIKE VIAL GLASS SM (MISCELLANEOUS) ×1 IMPLANT
DEVICE SUT QUICK LOAD TK 5 (STAPLE) IMPLANT
DEVICE SUT TI-KNOT TK 5X26 (MISCELLANEOUS) IMPLANT
DEVICE SUTURE ENDOST 10MM (ENDOMECHANICALS) ×2 IMPLANT
DRAPE C-ARM 42X120 X-RAY (DRAPES) IMPLANT
DRAPE CAMERA CLOSED 9X96 (DRAPES) ×2 IMPLANT
DRAPE LAPAROSCOPIC ABDOMINAL (DRAPES) IMPLANT
DRAPE LAPAROTOMY T 102X78X121 (DRAPES) IMPLANT
DRAPE SHEET LG 3/4 BI-LAMINATE (DRAPES) IMPLANT
DRAPE WARM FLUID 44X44 (DRAPE) ×2 IMPLANT
DRSG PAD ABDOMINAL 8X10 ST (GAUZE/BANDAGES/DRESSINGS) IMPLANT
ELECT REM PT RETURN 9FT ADLT (ELECTROSURGICAL) ×2
ELECTRODE REM PT RTRN 9FT ADLT (ELECTROSURGICAL) ×1 IMPLANT
GAUZE SPONGE 4X4 12PLY STRL (GAUZE/BANDAGES/DRESSINGS) IMPLANT
GAUZE SPONGE 4X4 16PLY XRAY LF (GAUZE/BANDAGES/DRESSINGS) ×1 IMPLANT
GLOVE BIOGEL PI IND STRL 7.0 (GLOVE) ×1 IMPLANT
GLOVE BIOGEL PI INDICATOR 7.0 (GLOVE) ×4
GLOVE ECLIPSE 8.0 STRL XLNG CF (GLOVE) ×1 IMPLANT
GLOVE INDICATOR 8.0 STRL GRN (GLOVE) ×2 IMPLANT
GOWN STRL REUS W/TWL LRG LVL3 (GOWN DISPOSABLE) ×2 IMPLANT
HEMOSTAT SURGICEL 4X8 (HEMOSTASIS) IMPLANT
IV LACTATED RINGERS 1000ML (IV SOLUTION) IMPLANT
KIT BASIN OR (CUSTOM PROCEDURE TRAY) ×2 IMPLANT
LEGGING LITHOTOMY PAIR STRL (DRAPES) ×1 IMPLANT
LUBRICANT JELLY K Y 4OZ (MISCELLANEOUS) ×2 IMPLANT
NDL INSUFFLATION 14GA 120MM (NEEDLE) IMPLANT
NEEDLE HYPO 22GX1.5 SAFETY (NEEDLE) ×2 IMPLANT
NEEDLE INSUFFLATION 14GA 120MM (NEEDLE) IMPLANT
NS IRRIG 1000ML POUR BTL (IV SOLUTION) ×3 IMPLANT
PACK GENERAL/GYN (CUSTOM PROCEDURE TRAY) ×1 IMPLANT
PENCIL BUTTON HOLSTER BLD 10FT (ELECTRODE) ×2 IMPLANT
PLATFORM TRANSANAL ACCESS 4X5 (MISCELLANEOUS) IMPLANT
PLATFORM TRANSANAL ACCESS 4X5. (MISCELLANEOUS) ×2
RELOAD ENDO STITCH 2.0 (ENDOMECHANICALS) ×2
RELOAD SUT SNGL STCH ABSRB 2-0 (ENDOMECHANICALS) ×1 IMPLANT
RETRACTOR STAY HOOK 5MM (MISCELLANEOUS) IMPLANT
RETRACTOR WILSON SYSTEM (INSTRUMENTS) IMPLANT
SCISSORS LAP 5X35 DISP (ENDOMECHANICALS) ×1 IMPLANT
SHEARS HARMONIC ACE PLUS 36CM (ENDOMECHANICALS) IMPLANT
SPONGE HEMORRHOID 8X3CM (HEMOSTASIS) IMPLANT
SPONGE LAP 18X18 X RAY DECT (DISPOSABLE) IMPLANT
SPONGE SURGIFOAM ABS GEL 100 (HEMOSTASIS) IMPLANT
STOPCOCK K 69 2C6206 (IV SETS) ×2 IMPLANT
SUCTION POOLE TIP (SUCTIONS) IMPLANT
SUT CHROMIC 2 0 SH (SUTURE) IMPLANT
SUT CHROMIC 3 0 SH 27 (SUTURE) IMPLANT
SUT PDS AB 2-0 CT2 27 (SUTURE) ×4 IMPLANT
SUT PDS AB 3-0 SH 27 (SUTURE) ×2 IMPLANT
SUT RELOAD ENDO STITCH 2 48X1 (ENDOMECHANICALS) ×1
SUT SILK 2 0 (SUTURE)
SUT SILK 2 0 SH CR/8 (SUTURE) IMPLANT
SUT SILK 2-0 18XBRD TIE 12 (SUTURE) IMPLANT
SUT SILK 3 0 SH 30 (SUTURE) IMPLANT
SUT SILK 3 0 SH CR/8 (SUTURE) IMPLANT
SUT VIC AB 2-0 SH 27 (SUTURE)
SUT VIC AB 2-0 SH 27X BRD (SUTURE) IMPLANT
SUT VIC AB 2-0 UR6 27 (SUTURE) IMPLANT
SUT VIC AB 3-0 SH 18 (SUTURE) IMPLANT
SUT VIC AB 3-0 SH 27 (SUTURE)
SUT VIC AB 3-0 SH 27XBRD (SUTURE) IMPLANT
SUT VIC AB 4-0 SH 18 (SUTURE) IMPLANT
SUT VLOC 180 0 24IN GS25 (SUTURE) ×2 IMPLANT
SUTURE RELOAD END STTCH 2 48X1 (ENDOMECHANICALS) ×1 IMPLANT
SYR 20CC LL (SYRINGE) IMPLANT
SYR 30ML LL (SYRINGE) IMPLANT
SYR BULB IRRIGATION 50ML (SYRINGE) IMPLANT
SYRINGE IRR TOOMEY STRL 70CC (SYRINGE) IMPLANT
TOWEL OR 17X26 10 PK STRL BLUE (TOWEL DISPOSABLE) ×2 IMPLANT
TRAY FOLEY W/METER SILVER 14FR (SET/KITS/TRAYS/PACK) ×2 IMPLANT
TUBING CONNECTING 10 (TUBING) ×1 IMPLANT
YANKAUER SUCT BULB TIP 10FT TU (MISCELLANEOUS) ×2 IMPLANT
YANKAUER SUCT BULB TIP NO VENT (SUCTIONS) IMPLANT

## 2014-08-27 NOTE — H&P (View-Only) (Signed)
Thomas Livingston 07/31/2014 10:37 AM Location: [facility] Patient #: [id number] DOB: 02-23-1951 Married / Language: Thomas Livingston / Race: White Male History of Present Illness Leighton Ruff MD; 1/32/4401 12:58 PM) Patient words: rectal mass.  The patient is a 64 year old male who presents with a colonic polyp. Patient presents to the office with a rectal mass seen on screening colonoscopy performed by Dr. Fuller Plan. Biopsies were negative for any abnormalities. The mass appeared to be submucosal. He underwent a endoscopic ultrasound with Dr. Ardis Hughs which showed a approximately 1 cm mass in the anterior rectum which appears to be near one of the rectal folds. Patient denies any symptoms. He is not having any blood per rectum. He denies any rectal pain. Other Problems [name], CMA; 07/31/2014 10:38 AM) Arthritis Cholelithiasis High blood pressure Hypercholesterolemia  Past Surgical History [name], CMA; 07/31/2014 10:38 AM) Gallbladder Surgery - Laparoscopic Oral Surgery Vasectomy  Diagnostic Studies History [name], CMA; 07/31/2014 10:38 AM) Colonoscopy 5-10 years ago  Allergies [name], CMA; 07/31/2014 10:39 AM) No Known Drug Allergies 07/31/2014 (Marked as Inactive) Lotrel *ANTIHYPERTENSIVES*  Medication History [name], CMA; 07/31/2014 10:40 AM) Amoxicillin-Pot Clavulanate (875-125MG  Tablet, Oral) Active. Benzonatate (200MG  Capsule, Oral) Active. Crestor (10MG  Tablet, Oral) Active. Fluticasone Propionate (50MCG/ACT Suspension, Nasal) Active. MoviPrep (100GM For Solution, Oral) Active. Flonase Allergy Relief (50MCG/ACT Suspension, Nasal) Active. Epi E-Z Pen Jr (1:2000 Device, Injection) Active. Naproxen Sodium (220MG  Tablet, Oral) Active. Allegra (180MG  Tablet, Oral) Active. Medications Reconciled  Social History [name], [place]; 07/31/2014 10:38 AM) Caffeine use Carbonated beverages, Coffee, Tea. No alcohol use No drug  use Tobacco use Never smoker.  Family History [name], [place]; 07/31/2014 10:38 AM) Cancer Father, Mother. Respiratory Condition Father. Thyroid problems Mother.     Review of Systems [name] CMA; 07/31/2014 10:38 AM) General Not Present- Appetite Loss, Chills, Fatigue, Fever, Night Sweats, Weight Gain and Weight Loss. Skin Not Present- Change in Wart/Mole, Dryness, Hives, Jaundice, New Lesions, Non-Healing Wounds, Rash and Ulcer. HEENT Present- Seasonal Allergies and Wears glasses/contact lenses. Not Present- Earache, Hearing Loss, Hoarseness, Nose Bleed, Oral Ulcers, Ringing in the Ears, Sinus Pain, Sore Throat, Visual Disturbances and Yellow Eyes. Respiratory Present- Chronic Cough and Snoring. Not Present- Bloody sputum, Difficulty Breathing and Wheezing. Breast Not Present- Breast Mass, Breast Pain, Nipple Discharge and Skin Changes. Cardiovascular Not Present- Chest Pain, Difficulty Breathing Lying Down, Leg Cramps, Palpitations, Rapid Heart Rate, Shortness of Breath and Swelling of Extremities. Gastrointestinal Not Present- Abdominal Pain, Bloating, Bloody Stool, Change in Bowel Habits, Chronic diarrhea, Constipation, Difficulty Swallowing, Excessive gas, Gets full quickly at meals, Hemorrhoids, Indigestion, Nausea, Rectal Pain and Vomiting. Male Genitourinary Not Present- Blood in Urine, Change in Urinary Stream, Frequency, Impotence, Nocturia, Painful Urination, Urgency and Urine Leakage. Musculoskeletal Present- Joint Pain. Not Present- Back Pain, Joint Stiffness, Muscle Pain, Muscle Weakness and Swelling of Extremities. Neurological Not Present- Decreased Memory, Fainting, Headaches, Numbness, Seizures, Tingling, Tremor, Trouble walking and Weakness. Psychiatric Not Present- Anxiety, Bipolar, Change in Sleep Pattern, Depression, Fearful and Frequent crying. Endocrine Not Present- Cold Intolerance, Excessive Hunger, Hair Changes, Heat Intolerance, Hot flashes and New  Diabetes. Hematology Not Present- Easy Bruising, Excessive bleeding, Gland problems, HIV and Persistent Infections.  Vitals [name] CMA; 07/31/2014 10:41 AM) 07/31/2014 10:40 AM Weight: 228 lb Height: 74in Body Surface Area: 2.32 m Body Mass Index: 29.27 kg/m Temp.: 97.4F(Temporal)  Pulse: 68 (Regular)  Resp.: 15 (Unlabored)  BP: 130/78 (Sitting, Left Arm, Standard)     Physical Exam Elmo Putt  Marcello Moores MD; 07/31/2014 12:58 PM)  General Mental Status-Alert. General Appearance-Consistent with stated age. Hydration-Well hydrated. Voice-Normal.  Head and Neck Head-normocephalic, atraumatic with no lesions or palpable masses. Trachea-midline. Thyroid Gland Characteristics - normal size and consistency.  Eye Eyeball - Bilateral-Extraocular movements intact. Sclera/Conjunctiva - Bilateral-No scleral icterus.  Chest and Lung Exam Chest and lung exam reveals -quiet, even and easy respiratory effort with no use of accessory muscles and on auscultation, normal breath sounds, no adventitious sounds and normal vocal resonance. Inspection Chest Wall - Normal. Back - normal.  Cardiovascular Cardiovascular examination reveals -normal heart sounds, regular rate and rhythm with no murmurs and normal pedal pulses bilaterally.  Abdomen Inspection Inspection of the abdomen reveals - No Hernias. Palpation/Percussion Palpation and Percussion of the abdomen reveal - Soft, Non Tender, No Rebound tenderness, No Rigidity (guarding) and No hepatosplenomegaly. Auscultation Auscultation of the abdomen reveals - Bowel sounds normal.  Rectal Anorectal Exam External - normal external exam. Internal - normal internal exam. Note: No masses palpated.  Neurologic Neurologic evaluation reveals -alert and oriented x 3 with no impairment of recent or remote memory. Mental Status-Normal.  Musculoskeletal Global Assessment -Note:no gross deformities.  Normal  Exam - Left-Upper Extremity Strength Normal and Lower Extremity Strength Normal. Normal Exam - Right-Upper Extremity Strength Normal and Lower Extremity Strength Normal.    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 Impression: On exam I cannot palpate a mass. I suspected thin the mid rectum. On colonoscopy reports it appears that the mass is anterior in nature. I have recommended a transanal minimally invasive surgery. We discussed this in detail. We discussed that this will require full thickness removal of the wall of his rectum with a sutured closure. He will do a rectal bowel prep prior to the procedure and will require preoperative antibiotics. I suspect that this will be a outpatient procedure with overnight observation. We discussed the risk of the procedure which are recurrent bleeding and perforation of the colon with anastomotic leak.

## 2014-08-27 NOTE — Discharge Instructions (Signed)
Beginning the day after surgery: ? ?You may sit in a tub of warm water 2-3 times a day to relieve discomfort. ? ?Eat a regular diet high in fiber.  Avoid foods that give you constipation or diarrhea.  Avoid foods that are difficult to digest, such as seeds, nuts, corn or popcorn. ? ?Do not go any longer than 2 days without a bowel movement.  You may take a dose of Milk of Magnesia if you become constipated.   ? ?Drink 6-8 glasses of water daily. ? ?Walking is encouraged.  Avoid strenuous activity and heavy lifting for one month after surgery.   ? ?Call the office if you have any questions or concerns.  Call immediately if you develop: ? ?Excessive rectal bleeding (more than a cup or passing large clots) ?Increased discomfort ?Fever greater than 100 F ?Difficulty urinating  ?

## 2014-08-27 NOTE — Op Note (Signed)
08/27/2014  1:33 PM  PATIENT:  Thomas Livingston  64 y.o. male  Patient Care Team: Susy Frizzle, MD as PCP - General (Family Medicine)  PRE-OPERATIVE DIAGNOSIS:  rectal tumor  POST-OPERATIVE DIAGNOSIS:  rectal tumor  PROCEDURE:  RIGID PROCTOSCOPY TRANSANAL MINIMALLY INVASIVE SURGERY with RESECTION OF RECTAL TUMOR via PARTIAL PROCTECTOMY   Surgeon(s): Leighton Ruff, MD  ASSISTANT: none   ANESTHESIA:   local and general  EBL:  Total I/O In: 1000 [I.V.:1000] Out: 200 [Urine:200]  Delay start of Pharmacological VTE agent (>24hrs) due to surgical blood loss or risk of bleeding:  no  DRAINS: none   SPECIMEN:  Source of Specimen:  anterior rectal mass  DISPOSITION OF SPECIMEN:  PATHOLOGY  COUNTS:  YES  PLAN OF CARE: Admit for overnight observation  PATIENT DISPOSITION:  PACU - hemodynamically stable.  INDICATION: 64 y.o. M with a distal rectal mass seen on colonoscopy.  US shows 1.2 cm anterior mass.  The anatomy & physiology of the digestive tract was discussed.  The pathophysiology of the rectal pathology was discussed.  Natural history risks without surgery was discussed.   I feel the risks of no intervention will lead to serious problems that outweigh the operative risks; therefore, I recommended surgery.    Laparoscopic & open abdominal techniques were discussed.  I recommended we start with a partial proctectomy by TAMIS for excisional biopsy to remove the pathology and hopefully cure and/or control the pathology.  This technique can offer less operative risk and faster post-operative recovery.  Possible need for immediate or later abdominal surgery for further treatment was discussed.   Risks such as bleeding, abscess, reoperation, ostomy, heart attack, death, and other risks were discussed.   I noted a good likelihood this will help address the problem.  Goals of post-operative recovery were discussed as well.  We will work to minimize complications.  An educational  handout was given as well.  Questions were answered.  The patient expresses understanding & wishes to proceed with surgery.  OR FINDINGS: Mid rectal submucosal mass   The resulting mass was ~1 cm in size  The closure rests ~10cm from the anal verge in the anterior location  DESCRIPTION: Informed consent was confirmed.  Patient received general anesthesia without difficulty.  Foley catheter sterilely placed.  Sequential compression devices active during the entire case. I performed a rigid proctoscopy to confirm anterior location of the tumor.  The patient was placed in the prone position, taking extra care to secure and protect the patient appropriately.  The perineum and perianal regions were prepped and draped in a sterile fashion.  Surgical timeout confirmed our plan.  I did a gentle digital rectal examination with gradual anal dilation to allow placement of the gelpoint device.  The ports were placed, and the gelpoint was secured to the anal retractor.  We induced carbon dioxide insufflation intraluminally.  I oriented the Stortz scope and the patient such that the specimen rested towards the floor at the 6:00 position.  I could easily identify the mass and the first valve of [place].  I went ahead and used the harmonic scalpel to mark ~32mm margins circumferentially.  I then did a full thickness transection at the proximal margin.  I came around laterally.  I continued to the distal margin and lifted the specimen off the pelvic canal for a good deep margin. There was ~1 cm defect noted in the anterior rectum.  This was made intentionally as part of the full thickness  resection.   I transected the deep margin and removed the specimen.  I ensured hemostasis. The wound was irrigated.    I reapproximated the wound with a 2-0 V lock stitch x2 running from both corners.  This brought things together well.  Hemostasis excellent.  Carbo dioxide evacuated.  I closed an small anal defect using a 3-0 Chromic  suture.  A rectal block was performed using Marcaine with epinephrine.  A dressing was applied.  The patient is being extubated go to recovery room.   I am about to discussed operative findings with the patient's family.

## 2014-08-27 NOTE — Interval H&P Note (Signed)
History and Physical Interval Note:  08/27/2014 10:37 AM  Thomas Livingston  has presented today for surgery, with the diagnosis of rectal tumor  The various methods of treatment have been discussed with the patient and family. After consideration of risks, benefits and other options for treatment, the patient has consented to  Procedure(s): PROCTOSCOPY (N/A) TRANSANAL RESECTION OF RECTAL TUMOR (N/A) as a surgical intervention .  The patient's history has been reviewed, patient examined, no change in status, stable for surgery.  I have reviewed the patient's chart and labs.  Questions were answered to the patient's satisfaction.     Rosario Adie, MD  [facility]

## 2014-08-27 NOTE — Transfer of Care (Signed)
Immediate Anesthesia Transfer of Care Note  Patient: Thomas Livingston  Procedure(s) Performed: Procedure(s): PROCTOSCOPY (N/A) TRANSANAL RESECTION OF RECTAL TUMOR (N/A)  Patient Location: PACU  Anesthesia Type:General  Level of Consciousness: Patient easily awoken, sedated, comfortable, cooperative, following commands, responds to stimulation.   Airway & Oxygen Therapy: Patient spontaneously breathing, ventilating well, oxygen via simple oxygen mask.  Post-op Assessment: Report given to PACU RN, vital signs reviewed and stable, moving all extremities.   Post vital signs: Reviewed and stable.  Complications: No apparent anesthesia complications

## 2014-08-27 NOTE — Anesthesia Procedure Notes (Signed)
Procedure Name: Intubation Date/Time: 08/27/2014 11:38 AM Performed by: Deliah Boston Pre-anesthesia Checklist: Patient identified, Emergency Drugs available, Suction available and Patient being monitored Patient Re-evaluated:Patient Re-evaluated prior to inductionOxygen Delivery Method: Circle System Utilized Preoxygenation: Pre-oxygenation with 100% oxygen Intubation Type: IV induction Ventilation: Mask ventilation without difficulty Laryngoscope Size: Glidescope and 4 Grade View: Grade I Tube type: Oral Tube size: 8.0 mm Number of attempts: 3 Airway Equipment and Method: Stylet and Video-laryngoscopy Placement Confirmation: ETT inserted through vocal cords under direct vision,  positive ETCO2 and breath sounds checked- equal and bilateral Secured at: 22 cm Tube secured with: Tape Dental Injury: Teeth and Oropharynx as per pre-operative assessment  Difficulty Due To: Difficulty was anticipated and Difficult Airway- due to anterior larynx

## 2014-08-27 NOTE — Anesthesia Postprocedure Evaluation (Signed)
Anesthesia Post Note  Patient: Thomas Livingston  Procedure(s) Performed: Procedure(s) (LRB): PROCTOSCOPY (N/A) TRANSANAL RESECTION OF RECTAL TUMOR (N/A)  Anesthesia type: general  Patient location: PACU  Post pain: Pain level controlled  Post assessment: Patient's Cardiovascular Status Stable  Last Vitals:  Filed Vitals:   08/27/14 1445  BP: 114/78  Pulse: 65  Temp: 36.8 C  Resp: 16    Post vital signs: Reviewed and stable  Level of consciousness: sedated  Complications: No apparent anesthesia complications

## 2014-08-27 NOTE — Anesthesia Preprocedure Evaluation (Addendum)
Anesthesia Evaluation  Patient identified by MRN, date of birth, ID band Patient awake    Reviewed: Allergy & Precautions, NPO status , Patient's Chart, lab work & pertinent test results  Airway Mallampati: III  TM Distance: <3 FB Neck ROM: Full    Dental no notable dental hx.    Pulmonary neg pulmonary ROS,  breath sounds clear to auscultation  Pulmonary exam normal       Cardiovascular hypertension, Rhythm:Regular Rate:Normal     Neuro/Psych negative neurological ROS  negative psych ROS   GI/Hepatic negative GI ROS, Neg liver ROS,   Endo/Other  negative endocrine ROS  Renal/GU negative Renal ROS  negative genitourinary   Musculoskeletal negative musculoskeletal ROS (+)   Abdominal   Peds negative pediatric ROS (+)  Hematology negative hematology ROS (+)   Anesthesia Other Findings   Reproductive/Obstetrics negative OB ROS                           Anesthesia Physical Anesthesia Plan  ASA: I  Anesthesia Plan: General   Post-op Pain Management:    Induction: Intravenous  Airway Management Planned: Oral ETT  Additional Equipment:   Intra-op Plan:   Post-operative Plan: Extubation in OR  Informed Consent: I have reviewed the patients History and Physical, chart, labs and discussed the procedure including the risks, benefits and alternatives for the proposed anesthesia with the patient or authorized representative who has indicated his/her understanding and acceptance.   Dental advisory given  Plan Discussed with: CRNA and Surgeon  Anesthesia Plan Comments:         Anesthesia Quick Evaluation

## 2014-08-28 ENCOUNTER — Observation Stay (HOSPITAL_COMMUNITY): Payer: 59 | Admitting: Anesthesiology

## 2014-08-28 ENCOUNTER — Encounter (HOSPITAL_COMMUNITY): Payer: Self-pay | Admitting: General Surgery

## 2014-08-28 DIAGNOSIS — D1779 Benign lipomatous neoplasm of other sites: Secondary | ICD-10-CM | POA: Diagnosis not present

## 2014-08-28 LAB — CBC
HCT: 39.1 % (ref 39.0–52.0)
Hemoglobin: 13.1 g/dL (ref 13.0–17.0)
MCH: 31.3 pg (ref 26.0–34.0)
MCHC: 33.5 g/dL (ref 30.0–36.0)
MCV: 93.5 fL (ref 78.0–100.0)
Platelets: 214 10*3/uL (ref 150–400)
RBC: 4.18 MIL/uL — ABNORMAL LOW (ref 4.22–5.81)
RDW: 12.9 % (ref 11.5–15.5)
WBC: 16.2 10*3/uL — ABNORMAL HIGH (ref 4.0–10.5)

## 2014-08-28 MED ORDER — METRONIDAZOLE 500 MG PO TABS: 500.0000 mg | ORAL_TABLET | Freq: Three times a day (TID) | ORAL | Status: DC

## 2014-08-28 MED ORDER — DEXAMETHASONE SODIUM PHOSPHATE 10 MG/ML IJ SOLN
10.0000 mg | Freq: Once | INTRAMUSCULAR | Status: AC
  Administered 2014-08-28: 10 mg via INTRAVENOUS
  Filled 2014-08-28: qty 1

## 2014-08-28 MED ORDER — CIPROFLOXACIN HCL 500 MG PO TABS: 500.0000 mg | ORAL_TABLET | Freq: Two times a day (BID) | ORAL | Status: DC

## 2014-08-28 MED ORDER — METRONIDAZOLE 500 MG PO TABS
500.0000 mg | ORAL_TABLET | Freq: Four times a day (QID) | ORAL | Status: DC
  Administered 2014-08-28: 500 mg via ORAL
  Filled 2014-08-28 (×5): qty 1

## 2014-08-28 MED ORDER — CIPROFLOXACIN HCL 500 MG PO TABS
500.0000 mg | ORAL_TABLET | Freq: Two times a day (BID) | ORAL | Status: DC
  Administered 2014-08-28: 500 mg via ORAL
  Filled 2014-08-28 (×3): qty 1

## 2014-08-28 NOTE — Progress Notes (Signed)
UR completed 

## 2014-08-28 NOTE — Progress Notes (Signed)
Patient complains of back of throat feeling swollen.  Anesthesia called.

## 2014-08-28 NOTE — Discharge Summary (Signed)
Physician Discharge Summary  Patient ID: UGONNA KEIRSEY MRN: [id number] DOB/AGE: 11-20-1950 64 y.o.  Admit date: 08/27/2014 Discharge date: 08/28/2014  Admission Diagnoses: Rectal mass   Discharge Diagnoses:  Active Problems:   Rectal mass   Discharged Condition: good  Hospital Course: Pt did well overnight.  Having some mild suprapubic cramping only.  No tachycardia or fevers.   Consults: None  Significant Diagnostic Studies: labs: cbc   Treatments: IV hydration, analgesia: acetaminophen w/ codeine and surgery: TAMIS resection of rectal mass  Discharge Exam: Blood pressure 105/61, pulse 64, temperature 98.3 F (36.8 C), temperature source Oral, resp. rate 16, height 6\' 2"  (1.88 m), weight 105.235 kg (232 lb), SpO2 98 %. General appearance: alert and cooperative GI: normal findings: soft, non-tender  Disposition: 81-Discharged to home/self-care with a planned acute care hospital inpt readmission     Medication List    STOP taking these medications        amoxicillin-clavulanate 875-125 MG per tablet  Commonly known as:  AUGMENTIN      TAKE these medications        benzonatate 200 MG capsule  Commonly known as:  TESSALON  Take 1 capsule (200 mg total) by mouth every 8 (eight) hours as needed for cough.     ciprofloxacin 500 MG tablet  Commonly known as:  CIPRO  Take 1 tablet (500 mg total) by mouth 2 (two) times daily.     CRESTOR 10 MG tablet  Generic drug:  rosuvastatin  Take 1 tablet by mouth  daily     EPINEPHrine 0.3 mg/0.3 mL Soaj injection  Commonly known as:  EPIPEN  Inject 0.3 mLs (0.3 mg total) into the muscle once.     fexofenadine 180 MG tablet  Commonly known as:  ALLEGRA  Take 180 mg by mouth every evening.     fluticasone 50 MCG/ACT nasal spray  Commonly known as:  FLONASE  Use 2 sprays in each  nostril daily     metroNIDAZOLE 500 MG tablet  Commonly known as:  FLAGYL  Take 1 tablet (500 mg total) by mouth 3 (three) times daily.      multivitamin tablet  Take 1 tablet by mouth daily.     naproxen sodium 220 MG tablet  Commonly known as:  ANAPROX  Take 440 mg by mouth daily.           Follow-up Information    Follow up with Rosario Adie., MD. Schedule an appointment as soon as possible for a visit in 3 weeks.   Specialty:  General Surgery   Contact information:   [address] [place] [address] [phone]       Signed: Rosario Adie 2/70/7867, 5:44 AM

## 2014-08-28 NOTE — Progress Notes (Signed)
Discharge instructions given. Questions answered 

## 2014-08-29 ENCOUNTER — Telehealth: Payer: Self-pay | Admitting: General Surgery

## 2014-08-29 NOTE — Telephone Encounter (Signed)
Notified pt's wife of benign pathology.  Pt unavailable

## 2014-09-12 ENCOUNTER — Ambulatory Visit (INDEPENDENT_AMBULATORY_CARE_PROVIDER_SITE_OTHER): Payer: 59 | Admitting: Physician Assistant

## 2014-09-12 ENCOUNTER — Encounter: Payer: Self-pay | Admitting: Physician Assistant

## 2014-09-12 VITALS — BP 132/80 | HR 68 | Temp 98.9°F | Resp 18 | Wt 228.0 lb

## 2014-09-12 DIAGNOSIS — J029 Acute pharyngitis, unspecified: Secondary | ICD-10-CM | POA: Diagnosis not present

## 2014-09-12 DIAGNOSIS — J020 Streptococcal pharyngitis: Secondary | ICD-10-CM

## 2014-09-12 LAB — RAPID STREP SCREEN (MED CTR MEBANE ONLY): Streptococcus, Group A Screen (Direct): POSITIVE — AB

## 2014-09-12 MED ORDER — AMOXICILLIN-POT CLAVULANATE 500-125 MG PO TABS: 1.0000 | ORAL_TABLET | Freq: Three times a day (TID) | ORAL | Status: DC

## 2014-09-12 NOTE — Progress Notes (Signed)
Patient ID: Thomas Livingston MRN: [id number], DOB: [birth year], 64 y.o. Date of Encounter: 09/12/2014, 3:15 PM    Chief Complaint:  Chief Complaint  Patient presents with  . fever x 1 day    100-102.6, feels terrible, head congestion, sore throat     HPI: 64 y.o. year old white male states that he felt normal Monday 09/10/14. However when he woke up on the morning of Tuesday 09/11/14 he says he felt horrible. Spent most of that day sleeping and lying around because he felt really tired. Tuesday morning his throat was really sore but now it is not so sore. Says he is not getting much of any drainage from his nose. He feels a little bit of drainage in his throat but none in his chest. No chest congestion or deep congested cough. Says that last night around bedtime his fever was 102.6. He used both Tylenol and Advil during the night to keep his fever controlled. However says that this morning when medications were worn off and out of his system-- temperature at that time only read 99.2 this morning without medication and his system. Says the temperature this afternoon was 99.1. Says that he knows that a lot of illnesses are viral and that you cannot take antibiotics for every illness. Says he probably would not have come in today but he had called his surgeon's office about an appointment there and he mentioned these symptoms and they told him to come see [place].     Home Meds:   Outpatient Prescriptions Prior to Visit  Medication Sig Dispense Refill  . CRESTOR 10 MG tablet Take 1 tablet by mouth  daily 90 tablet 3  . EPINEPHrine (EPIPEN) 0.3 mg/0.3 mL DEVI Inject 0.3 mLs (0.3 mg total) into the muscle once. 1 Device 1  . fexofenadine (ALLEGRA) 180 MG tablet Take 180 mg by mouth every evening.     . fluticasone (FLONASE) 50 MCG/ACT nasal spray Use 2 sprays in each  nostril daily (Patient taking differently: every evening. Use 2 sprays in each  nostril daily) 48 g 3  . Multiple Vitamin (MULTIVITAMIN)  tablet Take 1 tablet by mouth daily.    . naproxen sodium (ANAPROX) 220 MG tablet Take 440 mg by mouth daily.     . ciprofloxacin (CIPRO) 500 MG tablet Take 1 tablet (500 mg total) by mouth 2 (two) times daily. (Patient not taking: Reported on 09/12/2014) 12 tablet 0  . metroNIDAZOLE (FLAGYL) 500 MG tablet Take 1 tablet (500 mg total) by mouth 3 (three) times daily. (Patient not taking: Reported on 09/12/2014) 18 tablet 0  . benzonatate (TESSALON) 200 MG capsule Take 1 capsule (200 mg total) by mouth every 8 (eight) hours as needed for cough. (Patient not taking: Reported on 08/15/2014) 30 capsule 0   No facility-administered medications prior to visit.    Allergies:  Allergies  Allergen Reactions  . Lotrel [Amlodipine Besy-Benazepril Hcl] Hives and Swelling  . Other Swelling    lycra and spandex=hives and swelling  . Chalk Other (See Comments)    sneezing      Review of Systems: See HPI for pertinent ROS. All other ROS negative.    Physical Exam: Blood pressure 132/80, pulse 68, temperature 98.9 F (37.2 C), temperature source Oral, resp. rate 18, weight 228 lb (103.42 kg)., Body mass index is 29.26 kg/(m^2). General:  WNWD WM. Appears in no acute distress. HEENT: Normocephalic, atraumatic, eyes without discharge, sclera non-icteric, nares are without discharge. Bilateral auditory  canals clear, TM's are without perforation, pearly grey and translucent with reflective cone of light bilaterally. Oral cavity moist, posterior pharynx without exudate,  peritonsillar abscess. There is mild-moderate erythema of posterior pharynx. Neck: Supple. No thyromegaly. No lymphadenopathy. Lungs: Clear bilaterally to auscultation without wheezes, rales, or rhonchi. Breathing is unlabored. Heart: Regular rhythm. No murmurs, rubs, or gallops. Msk:  Strength and tone normal for age. Extremities/Skin: Warm and dry.  No rashes. Neuro: Alert and oriented X 3. Moves all extremities spontaneously. Gait is normal.  CNII-XII grossly in tact. Psych:  Responds to questions appropriately with a normal affect.     ASSESSMENT AND PLAN:  64 y.o. year old male with   1. Strep pharyngitis Rapid strep test positive. Patient is to take antibiotic as directed and complete all 10 days of antibiotic. Can use lozenges spray Tylenol Motrin for pain management of sore throat. Continue Tylenol/Motrin for fever control as well. Follow-up if symptoms worsen or do not resolve with completion of antibiotic. - amoxicillin-clavulanate (AUGMENTIN) 500-125 MG per tablet; Take 1 tablet (500 mg total) by mouth 3 (three) times daily.  Dispense: 30 tablet; Refill: 0  2. Sorethroat - Rapid strep screen  1. Sorethroat - Rapid strep screen   Signed, Olean Ree [place], [place], [facility] 09/12/2014 3:15 PM

## 2015-01-15 ENCOUNTER — Ambulatory Visit (INDEPENDENT_AMBULATORY_CARE_PROVIDER_SITE_OTHER): Payer: 59 | Admitting: Family Medicine

## 2015-01-15 ENCOUNTER — Encounter: Payer: Self-pay | Admitting: Family Medicine

## 2015-01-15 VITALS — BP 110/72 | HR 68 | Temp 98.4°F | Resp 18 | Ht 74.0 in | Wt 222.0 lb

## 2015-01-15 DIAGNOSIS — R040 Epistaxis: Secondary | ICD-10-CM | POA: Diagnosis not present

## 2015-01-15 NOTE — Progress Notes (Signed)
Subjective:    Patient ID: Thomas Livingston, male    DOB: July 27, 1950, 64 y.o.   MRN: [id number]  HPI  Beginning last week, the patient has had nosebleeds that have always come out of the right nostril. They occur almost every day. They were severe early in the week but they have gradually started to taper off. His last nosebleed was Saturday. It was mild. It was  Provoked by him picking his nose. He denies any headaches. His blood pressures well controlled. He denies any sinus pain. He is taking Flonase. On examination of  His nasal passages today, I can find no punctate areas of bleeding that would be amenable to cauterization.. Past Medical History  Diagnosis Date  . Hyperlipidemia   . Allergy   . Hypertension 2012    no bp meds since 2012  . Arthritis     left hip  . Multiple lung nodules on CT 2007    no current follow up done  . Psoriasis     both legs   Past Surgical History  Procedure Laterality Date  . Mouth surgery  1972  . Eus N/A 06/28/2014    Procedure: LOWER ENDOSCOPIC ULTRASOUND (EUS);  Surgeon: Milus Banister, MD;  Location: [facility];  Service: Endoscopy;  Laterality: N/A;  . Cholecystectomy  2007  . Proctoscopy N/A 08/27/2014    Procedure: PROCTOSCOPY;  Surgeon: Leighton Ruff, MD;  Location: [facility];  Service: General;  Laterality: N/A;  . Transanal rectal resection N/A 08/27/2014    Procedure: TRANSANAL RESECTION OF RECTAL TUMOR;  Surgeon: Leighton Ruff, MD;  Location: [facility];  Service: General;  Laterality: N/A;   Current Outpatient Prescriptions on File Prior to Visit  Medication Sig Dispense Refill  . CRESTOR 10 MG tablet Take 1 tablet by mouth  daily 90 tablet 3  . EPINEPHrine (EPIPEN) 0.3 mg/0.3 mL DEVI Inject 0.3 mLs (0.3 mg total) into the muscle once. 1 Device 1  . fexofenadine (ALLEGRA) 180 MG tablet Take 180 mg by mouth every evening.     . fluticasone (FLONASE) 50 MCG/ACT nasal spray Use 2 sprays in each  nostril daily 48 g 3  . Multiple Vitamin  (MULTIVITAMIN) tablet Take 1 tablet by mouth daily.    . naproxen sodium (ANAPROX) 220 MG tablet Take 440 mg by mouth daily.      No current facility-administered medications on file prior to visit.   Allergies  Allergen Reactions  . Lotrel [Amlodipine Besy-Benazepril Hcl] Hives and Swelling  . Other Swelling    lycra and spandex=hives and swelling  . Chalk Other (See Comments)    sneezing   Social History   Social History  . Marital Status: Married    Spouse Name: N/A  . Number of Children: N/A  . Years of Education: N/A   Occupational History  . Not on file.   Social History Main Topics  . Smoking status: Never Smoker   . Smokeless tobacco: Never Used  . Alcohol Use: No  . Drug Use: No  . Sexual Activity: Not on file   Other Topics Concern  . Not on file   Social History Narrative     Review of Systems  All other systems reviewed and are negative.      Objective:   Physical Exam  Constitutional: He appears well-developed and well-nourished.  HENT:  Nose: No mucosal edema, rhinorrhea or nose lacerations. No epistaxis. Right sinus exhibits no maxillary sinus tenderness and no frontal sinus tenderness.  Left sinus exhibits no maxillary sinus tenderness and no frontal sinus tenderness.  Cardiovascular: Normal rate, regular rhythm and normal heart sounds.   Pulmonary/Chest: Effort normal and breath sounds normal.  Vitals reviewed.         Assessment & Plan:  Epistaxis  On exam today, I can find no areas of bleeding on the anterior right nasal septum that are amenable to cauterization with silver nitrate. I gave the patient the option of widespread cauterization of the septum "blindly", however the patient does not want to proceed with that today. Instead I recommended that he stop Flonase. He avoid picking his nose. He avoid sneezing or blowing his nose hard for the next week. I recommended stopping the naproxen. I recommended using Tylenol for aches and pains.  Also recommended using Afrin 2 sprays each nostril twice daily. If epistaxis returns, I would recommend blind cautery.

## 2015-03-21 ENCOUNTER — Ambulatory Visit: Payer: 59

## 2015-06-12 ENCOUNTER — Encounter: Payer: Self-pay | Admitting: Family Medicine

## 2015-06-12 ENCOUNTER — Ambulatory Visit (INDEPENDENT_AMBULATORY_CARE_PROVIDER_SITE_OTHER): Payer: BC Managed Care – PPO | Admitting: Family Medicine

## 2015-06-12 VITALS — BP 118/78 | HR 68 | Temp 97.8°F | Resp 18 | Wt 225.0 lb

## 2015-06-12 DIAGNOSIS — J019 Acute sinusitis, unspecified: Secondary | ICD-10-CM | POA: Diagnosis not present

## 2015-06-12 MED ORDER — AMOXICILLIN-POT CLAVULANATE 875-125 MG PO TABS: 1.0000 | ORAL_TABLET | Freq: Two times a day (BID) | ORAL | Status: DC

## 2015-06-12 NOTE — Progress Notes (Signed)
Patient ID: Thomas Livingston, male   DOB: 08/18/1950, 65 y.o.   MRN: [id number]    Subjective:    Patient ID: Thomas Livingston, male    DOB: [birth year], 65 y.o.   MRN: [id number]  Patient presents for Sinusitis  patient here with sinus pressure and drainage for the past few days. He has have history of sinusitis. He does take allergy medicine on a regular basis. He is also using Flonase and nasal rinses. He began having significant pressure and pain on the right side which he feels is more stopped up. He is getting thick yellow-green  discharge. He has not had any fever. No known sick contacts. No significant cough.    Review Of Systems:  GEN- denies fatigue, fever, weight loss,weakness, recent illness HEENT- denies eye drainage, change in vision, +nasal discharge, CVS- denies chest pain, palpitations RESP- denies SOB, cough, wheeze ABD- denies N/V, change in stools, abd pain Neuro- + headache, dizziness, syncope, seizure activity       Objective:    BP 118/78 mmHg  Pulse 68  Temp(Src) 97.8 F (36.6 C) (Oral)  Resp 18  Wt 225 lb (102.059 kg) GEN- NAD, alert and oriented x3 HEENT- PERRL, EOMI, non injected sclera, pink conjunctiva, MMM, oropharynx  Clear TM clear bilat no effusion,  + maxillary sinus tenderness R >l, inflammed turbinates,  Nasal drainage  Neck- Supple, no LAD CVS- RRR, no murmur RESP-CTAB EXT- No edema Pulses- Radial 2+         Assessment & Plan:      Problem List Items Addressed This Visit    None    Visit Diagnoses    Acute rhinosinusitis    -  Primary    Augmentin, Sudafed, nasal sprays    Relevant Medications    amoxicillin-clavulanate (AUGMENTIN) 875-125 MG tablet       Note: This dictation was prepared with Dragon dictation along with smaller phrase technology. Any transcriptional errors that result from this process are unintentional.

## 2015-06-12 NOTE — Patient Instructions (Signed)
Take antibiotics Use sudafed Use nasal saline and flonase F/U for Physical with Dr. Dennard Schaumann

## 2015-06-25 ENCOUNTER — Encounter: Payer: Self-pay | Admitting: Family Medicine

## 2015-06-25 ENCOUNTER — Ambulatory Visit (INDEPENDENT_AMBULATORY_CARE_PROVIDER_SITE_OTHER): Payer: BC Managed Care – PPO | Admitting: Family Medicine

## 2015-06-25 VITALS — BP 136/86 | HR 60 | Temp 97.6°F | Resp 16 | Ht 74.0 in | Wt 225.0 lb

## 2015-06-25 DIAGNOSIS — Z1159 Encounter for screening for other viral diseases: Secondary | ICD-10-CM

## 2015-06-25 DIAGNOSIS — Z0000 Encounter for general adult medical examination without abnormal findings: Secondary | ICD-10-CM | POA: Diagnosis not present

## 2015-06-25 DIAGNOSIS — Z23 Encounter for immunization: Secondary | ICD-10-CM

## 2015-06-25 LAB — COMPLETE METABOLIC PANEL WITH GFR
ALT: 16 U/L (ref 9–46)
AST: 20 U/L (ref 10–35)
Albumin: 3.7 g/dL (ref 3.6–5.1)
Alkaline Phosphatase: 82 U/L (ref 40–115)
BUN: 12 mg/dL (ref 7–25)
CO2: 27 mmol/L (ref 20–31)
Calcium: 9.1 mg/dL (ref 8.6–10.3)
Chloride: 101 mmol/L (ref 98–110)
Creat: 0.7 mg/dL (ref 0.70–1.25)
GFR, Est African American: >89 mL/min (ref >=60)
GFR, Est Non African American: >89 mL/min (ref >=60)
Glucose, Bld: 74 mg/dL (ref 70–99)
Potassium: 4.3 mmol/L (ref 3.5–5.3)
Sodium: 136 mmol/L (ref 135–146)
Total Bilirubin: 0.7 mg/dL (ref 0.2–1.2)
Total Protein: 6.6 g/dL (ref 6.1–8.1)

## 2015-06-25 LAB — CBC WITH DIFFERENTIAL/PLATELET
Basophils Absolute: 0 10*3/uL (ref 0.0–0.1)
Basophils Relative: 0 % (ref 0–1)
Eosinophils Absolute: 0.3 10*3/uL (ref 0.0–0.7)
Eosinophils Relative: 4 % (ref 0–5)
HCT: 44.4 % (ref 39.0–52.0)
Hemoglobin: 15 g/dL (ref 13.0–17.0)
Lymphocytes Relative: 13 % (ref 12–46)
Lymphs Abs: 0.9 10*3/uL (ref 0.7–4.0)
MCH: 30.3 pg (ref 26.0–34.0)
MCHC: 33.8 g/dL (ref 30.0–36.0)
MCV: 89.7 fL (ref 78.0–100.0)
MPV: 10 fL (ref 8.6–12.4)
Monocytes Absolute: 0.8 10*3/uL (ref 0.1–1.0)
Monocytes Relative: 11 % (ref 3–12)
Neutro Abs: 5.3 10*3/uL (ref 1.7–7.7)
Neutrophils Relative %: 72 % (ref 43–77)
Platelets: 281 10*3/uL (ref 150–400)
RBC: 4.95 MIL/uL (ref 4.22–5.81)
RDW: 13.3 % (ref 11.5–15.5)
WBC: 7.3 10*3/uL (ref 4.0–10.5)

## 2015-06-25 LAB — LIPID PANEL
Cholesterol: 138 mg/dL (ref 125–200)
HDL: 38 mg/dL — ABNORMAL LOW (ref >=40)
LDL Cholesterol: 84 mg/dL (ref <130)
Total CHOL/HDL Ratio: 3.6 Ratio (ref <=5.0)
Triglycerides: 78 mg/dL (ref <150)
VLDL: 16 mg/dL (ref <30)

## 2015-06-25 NOTE — Progress Notes (Signed)
Subjective:    Patient ID: Thomas Livingston, male    DOB: [birth year], 65 y.o.   MRN: [id number]  HPI  Patient is a very pleasant 65 year old white male who is here today for complete physical exam. Patient works as a Herbalist at [employer]. He has no medical concerns. He  Had his colonoscopy last year. Biopsy revealed a lipoma but no cancerous lesions. He is due for repeat colonoscopy in 2026.  Otherwise he is doing very well.  Immunization History  Administered Date(s) Administered  . Influenza,inj,Quad PF,36+ Mos 02/27/2014, 06/25/2015  . Tdap 11/14/2009, 12/16/2013  . Zoster 02/27/2014    Past Medical History  Diagnosis Date  . Hyperlipidemia   . Allergy   . Hypertension 2012    no bp meds since 2012  . Arthritis     left hip  . Multiple lung nodules on CT 2007    no current follow up done  . Psoriasis     both legs   Past Surgical History  Procedure Laterality Date  . Mouth surgery  1972  . Eus N/A 06/28/2014    Procedure: LOWER ENDOSCOPIC ULTRASOUND (EUS);  Surgeon: Milus Banister, MD;  Location: [facility];  Service: Endoscopy;  Laterality: N/A;  . Cholecystectomy  2007  . Proctoscopy N/A 08/27/2014    Procedure: PROCTOSCOPY;  Surgeon: Leighton Ruff, MD;  Location: [facility];  Service: General;  Laterality: N/A;  . Transanal rectal resection N/A 08/27/2014    Procedure: TRANSANAL RESECTION OF RECTAL TUMOR;  Surgeon: Leighton Ruff, MD;  Location: [facility];  Service: General;  Laterality: N/A;   Current Outpatient Prescriptions on File Prior to Visit  Medication Sig Dispense Refill  . CRESTOR 10 MG tablet Take 1 tablet by mouth  daily 90 tablet 3  . EPINEPHrine (EPIPEN) 0.3 mg/0.3 mL DEVI Inject 0.3 mLs (0.3 mg total) into the muscle once. 1 Device 1  . fexofenadine (ALLEGRA) 180 MG tablet Take 180 mg by mouth every evening.     . fluticasone (FLONASE) 50 MCG/ACT nasal spray Use 2 sprays in each  nostril daily 48 g 3  . Multiple Vitamin (MULTIVITAMIN)  tablet Take 1 tablet by mouth daily.    . naproxen sodium (ANAPROX) 220 MG tablet Take 440 mg by mouth daily as needed.      No current facility-administered medications on file prior to visit.   Allergies  Allergen Reactions  . Lotrel [Amlodipine Besy-Benazepril Hcl] Hives and Swelling  . Other Swelling    lycra and spandex=hives and swelling  . Chalk Other (See Comments)    sneezing   Social History   Social History  . Marital Status: Married    Spouse Name: N/A  . Number of Children: N/A  . Years of Education: N/A   Occupational History  . Not on file.   Social History Main Topics  . Smoking status: Never Smoker   . Smokeless tobacco: Never Used  . Alcohol Use: No  . Drug Use: No  . Sexual Activity: Not on file   Other Topics Concern  . Not on file   Social History Narrative   Family History  Problem Relation Age of Onset  . Thyroid cancer Mother   . Lung cancer Father   . Colon cancer Neg Hx       Review of Systems  All other systems reviewed and are negative.      Objective:   Physical Exam  Constitutional: He is oriented to person,  place, and time. He appears well-developed and well-nourished. No distress.  HENT:  Head: Normocephalic and atraumatic.  Right Ear: External ear normal.  Left Ear: External ear normal.  Nose: Nose normal.  Mouth/Throat: Oropharynx is clear and moist. No oropharyngeal exudate.  Eyes: Conjunctivae and EOM are normal. Pupils are equal, round, and reactive to light. Right eye exhibits no discharge. Left eye exhibits no discharge. No scleral icterus.  Neck: Normal range of motion. Neck supple. No JVD present. No tracheal deviation present. No thyromegaly present.  Cardiovascular: Normal rate, regular rhythm, normal heart sounds and intact distal pulses.  Exam reveals no gallop and no friction rub.   No murmur heard. Pulmonary/Chest: Effort normal and breath sounds normal. No stridor. No respiratory distress. He has no  wheezes. He has no rales. He exhibits no tenderness.  Abdominal: Soft. Bowel sounds are normal. He exhibits no distension and no mass. There is no tenderness. There is no rebound and no guarding.  Musculoskeletal: Normal range of motion. He exhibits no edema or tenderness.  Lymphadenopathy:    He has no cervical adenopathy.  Neurological: He is alert and oriented to person, place, and time. He has normal reflexes. No cranial nerve deficit. He exhibits normal muscle tone. Coordination normal.  Skin: Skin is warm. No rash noted. No erythema. No pallor.  Psychiatric: He has a normal mood and affect. His behavior is normal. Judgment and thought content normal.  Vitals reviewed.         Assessment & Plan:  Routine general medical examination at a health care facility - Plan: CBC with Differential/Platelet, COMPLETE METABOLIC PANEL WITH GFR, Lipid panel, PSA  Need for hepatitis C screening test - Plan: Hepatitis C Ab Reflex HCV RNA, QUANT  Need for prophylactic vaccination and inoculation against influenza - Plan: Flu Vaccine QUAD 36+ mos IM  patient's physical exam is completely normal. He received his flu shot today.  I also checked a CBC, CMP, fasting lipid panel, and PSA. I recommended 10 pounds of weight loss and increasing aerobic exercise. Patient's blood pressure is excellent. His goal LDL cholesterol is less than 130.

## 2015-06-26 LAB — PSA: PSA: 0.72 ng/mL (ref <=4.00)

## 2015-06-26 LAB — HEPATITIS C ANTIBODY: HCV Ab: NEGATIVE

## 2015-06-27 ENCOUNTER — Encounter: Payer: Self-pay | Admitting: Family Medicine

## 2015-07-12 ENCOUNTER — Ambulatory Visit (INDEPENDENT_AMBULATORY_CARE_PROVIDER_SITE_OTHER): Payer: BC Managed Care – PPO | Admitting: Family Medicine

## 2015-07-12 ENCOUNTER — Encounter: Payer: Self-pay | Admitting: Family Medicine

## 2015-07-12 VITALS — BP 132/88 | HR 78 | Temp 97.5°F | Resp 18 | Ht 74.0 in | Wt 224.0 lb

## 2015-07-12 DIAGNOSIS — J320 Chronic maxillary sinusitis: Secondary | ICD-10-CM

## 2015-07-12 MED ORDER — PREDNISONE 20 MG PO TABS: ORAL_TABLET | ORAL | Status: DC

## 2015-07-12 MED ORDER — CEFDINIR 300 MG PO CAPS: 300.0000 mg | ORAL_CAPSULE | Freq: Two times a day (BID) | ORAL | Status: DC

## 2015-07-12 NOTE — Progress Notes (Signed)
Subjective:    Patient ID: Thomas Livingston, male    DOB: November 20, 1950, 65 y.o.   MRN: [id number]  HPI  patient has been dealing with pain and pressure in his right maxillary sinus and right frontal sinus for now almost 6 weeks off and on. He is tried Afrin, Flonase, Allegra and nasal saline without relief. He reports  Dull constant pressure like headache and subjective fevers. He was treated earlier this month with Augmentin and Flonase and had temporary improvement in his symptoms but they returned to sinus he stopped the medication and have now worsened Past Medical History  Diagnosis Date  . Hyperlipidemia   . Allergy   . Hypertension 2012    no bp meds since 2012  . Arthritis     left hip  . Multiple lung nodules on CT 2007    no current follow up done  . Psoriasis     both legs   Past Surgical History  Procedure Laterality Date  . Mouth surgery  1972  . Eus N/A 06/28/2014    Procedure: LOWER ENDOSCOPIC ULTRASOUND (EUS);  Surgeon: Milus Banister, MD;  Location: [facility];  Service: Endoscopy;  Laterality: N/A;  . Cholecystectomy  2007  . Proctoscopy N/A 08/27/2014    Procedure: PROCTOSCOPY;  Surgeon: Leighton Ruff, MD;  Location: [facility];  Service: General;  Laterality: N/A;  . Transanal rectal resection N/A 08/27/2014    Procedure: TRANSANAL RESECTION OF RECTAL TUMOR;  Surgeon: Leighton Ruff, MD;  Location: [facility];  Service: General;  Laterality: N/A;   Current Outpatient Prescriptions on File Prior to Visit  Medication Sig Dispense Refill  . CRESTOR 10 MG tablet Take 1 tablet by mouth  daily 90 tablet 3  . EPINEPHrine (EPIPEN) 0.3 mg/0.3 mL DEVI Inject 0.3 mLs (0.3 mg total) into the muscle once. 1 Device 1  . fexofenadine (ALLEGRA) 180 MG tablet Take 180 mg by mouth every evening.     . fluticasone (FLONASE) 50 MCG/ACT nasal spray Use 2 sprays in each  nostril daily 48 g 3  . Multiple Vitamin (MULTIVITAMIN) tablet Take 1 tablet by mouth daily.    . naproxen sodium  (ANAPROX) 220 MG tablet Take 440 mg by mouth daily as needed.      No current facility-administered medications on file prior to visit.   Allergies  Allergen Reactions  . Lotrel [Amlodipine Besy-Benazepril Hcl] Hives and Swelling  . Other Swelling    lycra and spandex=hives and swelling  . Chalk Other (See Comments)    sneezing   Social History   Social History  . Marital Status: Married    Spouse Name: N/A  . Number of Children: N/A  . Years of Education: N/A   Occupational History  . Not on file.   Social History Main Topics  . Smoking status: Never Smoker   . Smokeless tobacco: Never Used  . Alcohol Use: No  . Drug Use: No  . Sexual Activity: Not on file   Other Topics Concern  . Not on file   Social History Narrative      Review of Systems  All other systems reviewed and are negative.      Objective:   Physical Exam  HENT:  Right Ear: External ear normal.  Left Ear: External ear normal.  Nose: Mucosal edema and rhinorrhea present. Right sinus exhibits maxillary sinus tenderness and frontal sinus tenderness.  Mouth/Throat: Oropharynx is clear and moist. No oropharyngeal exudate.  Eyes: Conjunctivae are normal.  Pupils are equal, round, and reactive to light.  Neck: Neck supple.  Cardiovascular: Normal rate, regular rhythm and normal heart sounds.   Pulmonary/Chest: Effort normal and breath sounds normal. No respiratory distress. He has no wheezes. He has no rales.  Lymphadenopathy:    He has no cervical adenopathy.  Vitals reviewed.         Assessment & Plan:  Chronic maxillary sinusitis - Plan: predniSONE (DELTASONE) 20 MG tablet, cefdinir (OMNICEF) 300 MG capsule   I believe the patient has been dealing with the same infection now for almost 6 weeks. It has improved but never completely cleared and then now it is exacerbated again. Begin Omnicef 300 mg by mouth twice a day for 10 days along with a prednisone taper pack. Recheck in one week if no  better or sooner if worse

## 2015-09-20 ENCOUNTER — Encounter: Payer: Self-pay | Admitting: Family Medicine

## 2015-09-20 ENCOUNTER — Ambulatory Visit (INDEPENDENT_AMBULATORY_CARE_PROVIDER_SITE_OTHER): Payer: BC Managed Care – PPO | Admitting: Family Medicine

## 2015-09-20 VITALS — BP 142/90 | HR 64 | Temp 97.8°F | Resp 16 | Ht 74.0 in | Wt 225.0 lb

## 2015-09-20 DIAGNOSIS — J208 Acute bronchitis due to other specified organisms: Secondary | ICD-10-CM | POA: Diagnosis not present

## 2015-09-20 MED ORDER — DOXYCYCLINE HYCLATE 100 MG PO TABS: 100.0000 mg | ORAL_TABLET | Freq: Two times a day (BID) | ORAL | Status: DC

## 2015-09-20 MED ORDER — ALBUTEROL SULFATE HFA 108 (90 BASE) MCG/ACT IN AERS: 2.0000 | INHALATION_SPRAY | Freq: Four times a day (QID) | RESPIRATORY_TRACT | Status: DC | PRN

## 2015-09-20 NOTE — Progress Notes (Signed)
Subjective:    Patient ID: Thomas Livingston, male    DOB: Jul 17, 1950, 65 y.o.   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 He was inhaling fecal material from both chickens and ducks. Immediately thereafter he developed a cough productive of yellow and clear sputum. He reports shortness of breath and wheezing. He reports subjective fevers. He also reports some mild rhinorrhea. On examination today he has diminished breath sounds bilaterally with faint wheezing. There are no crackles or rales. Past Medical History  Diagnosis Date  . Hyperlipidemia   . Allergy   . Hypertension 2012    no bp meds since 2012  . Arthritis     left hip  . Multiple lung nodules on CT 2007    no current follow up done  . Psoriasis     both legs   Past Surgical History  Procedure Laterality Date  . Mouth surgery  1972  . Eus N/A 06/28/2014    Procedure: LOWER ENDOSCOPIC ULTRASOUND (EUS);  Surgeon: Milus Banister, MD;  Location: [facility];  Service: Endoscopy;  Laterality: N/A;  . Cholecystectomy  2007  . Proctoscopy N/A 08/27/2014    Procedure: PROCTOSCOPY;  Surgeon: Leighton Ruff, MD;  Location: [facility];  Service: General;  Laterality: N/A;  . Transanal rectal resection N/A 08/27/2014    Procedure: TRANSANAL RESECTION OF RECTAL TUMOR;  Surgeon: Leighton Ruff, MD;  Location: [facility];  Service: General;  Laterality: N/A;   Current Outpatient Prescriptions on File Prior to Visit  Medication Sig Dispense Refill  . CRESTOR 10 MG tablet Take 1 tablet by mouth  daily 90 tablet 3  . EPINEPHrine (EPIPEN) 0.3 mg/0.3 mL DEVI Inject 0.3 mLs (0.3 mg total) into the muscle once. 1 Device 1  . fexofenadine (ALLEGRA) 180 MG tablet Take 180 mg by mouth every evening.     . fluticasone (FLONASE) 50 MCG/ACT nasal spray Use 2 sprays in each  nostril daily 48 g 3  . Multiple Vitamin (MULTIVITAMIN) tablet Take 1 tablet by mouth daily.    . naproxen sodium (ANAPROX) 220 MG tablet Take  440 mg by mouth daily as needed.     . predniSONE (DELTASONE) 20 MG tablet 3 tabs poqday 1-2, 2 tabs poqday 3-4, 1 tab poqday 5-6 12 tablet 0   No current facility-administered medications on file prior to visit.   Allergies  Allergen Reactions  . Lotrel [Amlodipine Besy-Benazepril Hcl] Hives and Swelling  . Other Swelling    lycra and spandex=hives and swelling  . Chalk Other (See Comments)    sneezing   Social History   Social History  . Marital Status: Married    Spouse Name: N/A  . Number of Children: N/A  . Years of Education: N/A   Occupational History  . Not on file.   Social History Main Topics  . Smoking status: Never Smoker   . Smokeless tobacco: Never Used  . Alcohol Use: No  . Drug Use: No  . Sexual Activity: Not on file   Other Topics Concern  . Not on file   Social History Narrative     Review of Systems  All other systems reviewed and are negative.      Objective:   Physical Exam  Constitutional: He appears well-developed and well-nourished. No distress.  HENT:  Nose: Rhinorrhea present.  Eyes: Conjunctivae are normal. Right eye exhibits no discharge. Left eye exhibits no discharge.  Neck: Neck supple.  Cardiovascular:  Normal rate, regular rhythm and normal heart sounds.  Exam reveals no gallop and no friction rub.   No murmur heard. Pulmonary/Chest: Effort normal. No respiratory distress. He has wheezes. He has no rales.  Lymphadenopathy:    He has no cervical adenopathy.  Skin: He is not diaphoretic.  Vitals reviewed.         Assessment & Plan:  Acute bronchitis due to other specified organisms - Plan: doxycycline (VIBRA-TABS) 100 MG tablet, albuterol (PROVENTIL HFA;VENTOLIN HFA) 108 (90 Base) MCG/ACT inhaler  Differential diagnosis includes bronchitis due to a virus or bacterial source, severe allergies and bronchospasm, however given the exposure to fecal material from chickens and birds and the sudden onset of symptoms thereafter,  I am concerned about Psittacosis.  Begin doxycycline 100 mg by mouth twice a day for 10 days. Use albuterol 2 puffs inhaled every 6 hours as needed. Recheck next week or immediately if worsening

## 2015-12-04 ENCOUNTER — Telehealth: Payer: Self-pay | Admitting: Family Medicine

## 2015-12-04 MED ORDER — ROSUVASTATIN CALCIUM 10 MG PO TABS: 10.0000 mg | ORAL_TABLET | Freq: Every day | ORAL | 3 refills | Status: DC

## 2015-12-04 NOTE — Telephone Encounter (Signed)
Pt is requesting a refill of Crestor to be sent to the [facility] on [address].

## 2015-12-04 NOTE — Telephone Encounter (Signed)
Medication called/sent to requested pharmacy  

## 2016-02-20 ENCOUNTER — Ambulatory Visit (INDEPENDENT_AMBULATORY_CARE_PROVIDER_SITE_OTHER): Payer: BC Managed Care – PPO | Admitting: Family Medicine

## 2016-02-20 ENCOUNTER — Encounter: Payer: Self-pay | Admitting: Family Medicine

## 2016-02-20 VITALS — BP 110/76 | HR 78 | Temp 98.7°F | Resp 18 | Ht 74.0 in | Wt 233.0 lb

## 2016-02-20 DIAGNOSIS — Z23 Encounter for immunization: Secondary | ICD-10-CM

## 2016-02-20 DIAGNOSIS — Z01818 Encounter for other preprocedural examination: Secondary | ICD-10-CM | POA: Diagnosis not present

## 2016-02-20 LAB — CBC WITH DIFFERENTIAL/PLATELET
Basophils Absolute: 84 cells/uL (ref 0–200)
Basophils Relative: 1 %
Eosinophils Absolute: 588 cells/uL — ABNORMAL HIGH (ref 15–500)
Eosinophils Relative: 7 %
HCT: 44.1 % (ref 38.5–50.0)
Hemoglobin: 14.7 g/dL (ref 13.0–17.0)
Lymphocytes Relative: 12 %
Lymphs Abs: 1008 cells/uL (ref 850–3900)
MCH: 30.3 pg (ref 27.0–33.0)
MCHC: 33.3 g/dL (ref 32.0–36.0)
MCV: 90.9 fL (ref 80.0–100.0)
MPV: 9.6 fL (ref 7.5–12.5)
Monocytes Absolute: 1092 cells/uL — ABNORMAL HIGH (ref 200–950)
Monocytes Relative: 13 %
Neutro Abs: 5628 cells/uL (ref 1500–7800)
Neutrophils Relative %: 67 %
Platelets: 239 10*3/uL (ref 140–400)
RBC: 4.85 MIL/uL (ref 4.20–5.80)
RDW: 13.4 % (ref 11.0–15.0)
WBC: 8.4 10*3/uL (ref 3.8–10.8)

## 2016-02-20 NOTE — Addendum Note (Signed)
Addended by: Shary Decamp B on: 02/20/2016 12:49 PM   Modules accepted: Orders

## 2016-02-20 NOTE — Addendum Note (Signed)
Addended by: Shary Decamp B on: 02/20/2016 02:41 PM   Modules accepted: Orders

## 2016-02-20 NOTE — Progress Notes (Signed)
Subjective:    Patient ID: Thomas Livingston, male    DOB: 02-06-1951, 65 y.o.   MRN: [id number]  HPI Patient is a very pleasant 65 year old white male who is here today for surgical clearance for left hip replacement.  Patient has been doing very well. He denies any chest pain shortness of breath or dyspnea on exertion. He denies any orthopnea or paroxysmal my internal dyspnea. He denies any shortness of breath. He denies any recent bleeding, blood in his stools, or black tarry stools.     Past Medical History:  Diagnosis Date  . Allergy   . Arthritis    left hip  . Hyperlipidemia   . Hypertension 2012   no bp meds since 2012  . Multiple lung nodules on CT 2007   no current follow up done  . Psoriasis    both legs   Past Surgical History:  Procedure Laterality Date  . CHOLECYSTECTOMY  2007  . EUS N/A 06/28/2014   Procedure: LOWER ENDOSCOPIC ULTRASOUND (EUS);  Surgeon: Milus Banister, MD;  Location: [facility];  Service: Endoscopy;  Laterality: N/A;  . [place]  . PROCTOSCOPY N/A 08/27/2014   Procedure: PROCTOSCOPY;  Surgeon: Leighton Ruff, MD;  Location: [facility];  Service: General;  Laterality: N/A;  . TRANSANAL RECTAL RESECTION N/A 08/27/2014   Procedure: TRANSANAL RESECTION OF RECTAL TUMOR;  Surgeon: Leighton Ruff, MD;  Location: [facility];  Service: General;  Laterality: N/A;   Current Outpatient Prescriptions on File Prior to Visit  Medication Sig Dispense Refill  . albuterol (PROVENTIL HFA;VENTOLIN HFA) 108 (90 Base) MCG/ACT inhaler Inhale 2 puffs into the lungs every 6 (six) hours as needed for wheezing or shortness of breath. 1 Inhaler 0  . EPINEPHrine (EPIPEN) 0.3 mg/0.3 mL DEVI Inject 0.3 mLs (0.3 mg total) into the muscle once. 1 Device 1  . fexofenadine (ALLEGRA) 180 MG tablet Take 180 mg by mouth every evening.     . fluticasone (FLONASE) 50 MCG/ACT nasal spray Use 2 sprays in each  nostril daily 48 g 3  . Multiple Vitamin (MULTIVITAMIN) tablet Take 1 tablet  by mouth daily.    . rosuvastatin (CRESTOR) 10 MG tablet Take 1 tablet (10 mg total) by mouth daily. 90 tablet 3   No current facility-administered medications on file prior to visit.    Allergies  Allergen Reactions  . Lotrel [Amlodipine Besy-Benazepril Hcl] Hives and Swelling  . Other Swelling    lycra and spandex=hives and swelling  . Chalk Other (See Comments)    sneezing   Social History   Social History  . Marital status: Married    Spouse name: N/A  . Number of children: N/A  . Years of education: N/A   Occupational History  . Not on file.   Social History Main Topics  . Smoking status: Never Smoker  . Smokeless tobacco: Never Used  . Alcohol use No  . Drug use: No  . Sexual activity: Not on file   Other Topics Concern  . Not on file   Social History Narrative  . No narrative on file   Family History  Problem Relation Age of Onset  . Thyroid cancer Mother   . Lung cancer Father   . Colon cancer Neg Hx       Review of Systems  All other systems reviewed and are negative.      Objective:   Physical Exam  Constitutional: He is oriented to person, place, and time.  He appears well-developed and well-nourished. No distress.  HENT:  Head: Normocephalic and atraumatic.  Right Ear: External ear normal.  Left Ear: External ear normal.  Nose: Nose normal.  Mouth/Throat: Oropharynx is clear and moist. No oropharyngeal exudate.  Eyes: Conjunctivae and EOM are normal. Pupils are equal, round, and reactive to light. Right eye exhibits no discharge. Left eye exhibits no discharge. No scleral icterus.  Neck: Normal range of motion. Neck supple. No JVD present. No tracheal deviation present. No thyromegaly present.  Cardiovascular: Normal rate, regular rhythm, normal heart sounds and intact distal pulses.  Exam reveals no gallop and no friction rub.   No murmur heard. Pulmonary/Chest: Effort normal and breath sounds normal. No stridor. No respiratory distress. He  has no wheezes. He has no rales. He exhibits no tenderness.  Abdominal: Soft. Bowel sounds are normal. He exhibits no distension and no mass. There is no tenderness. There is no rebound and no guarding.  Musculoskeletal: Normal range of motion. He exhibits no edema or tenderness.  Lymphadenopathy:    He has no cervical adenopathy.  Neurological: He is alert and oriented to person, place, and time. He has normal reflexes. No cranial nerve deficit. He exhibits normal muscle tone. Coordination normal.  Skin: Skin is warm. No rash noted. No erythema. No pallor.  Psychiatric: He has a normal mood and affect. His behavior is normal. Judgment and thought content normal.  Vitals reviewed.         Assessment & Plan:  Preop examination  Patient's physical exam is completely normal. EKG shows normal sinus rhythm with no evidence of ischemia or infarction with normal intervals and normal axis. I will check a CBC and a CMP. As long as the patient is not profoundly anemic, has new onset renal insufficiency, I see no reason he cannot proceed with this elective orthopedic surgery. The patient is medically cleared pending his lab work

## 2016-02-21 LAB — COMPREHENSIVE METABOLIC PANEL
ALT: 20 U/L (ref 9–46)
AST: 22 U/L (ref 10–35)
Albumin: 3.8 g/dL (ref 3.6–5.1)
Alkaline Phosphatase: 73 U/L (ref 40–115)
BUN: 9 mg/dL (ref 7–25)
CO2: 24 mmol/L (ref 20–31)
Calcium: 9.3 mg/dL (ref 8.6–10.3)
Chloride: 84 mmol/L — ABNORMAL LOW (ref 98–110)
Creat: 0.79 mg/dL (ref 0.70–1.25)
Glucose, Bld: 85 mg/dL (ref 70–99)
Potassium: 3.6 mmol/L (ref 3.5–5.3)
Sodium: 139 mmol/L (ref 135–146)
Total Bilirubin: 0.5 mg/dL (ref 0.2–1.2)
Total Protein: 6.2 g/dL (ref 6.1–8.1)

## 2016-03-30 ENCOUNTER — Telehealth: Payer: Self-pay | Admitting: Family Medicine

## 2016-03-30 DIAGNOSIS — J208 Acute bronchitis due to other specified organisms: Secondary | ICD-10-CM

## 2016-03-30 MED ORDER — DOXYCYCLINE HYCLATE 100 MG PO TABS: 100.0000 mg | ORAL_TABLET | Freq: Two times a day (BID) | ORAL | 0 refills | Status: DC

## 2016-03-30 NOTE — Telephone Encounter (Signed)
Doxycycline 100 bid for 10 days

## 2016-03-30 NOTE — Telephone Encounter (Signed)
Patient called in states he has another infection in his testicle he states Dr. Dennard Schaumann in the past will just call in antibiotic for this without seeing him. He uses [facility] on [address].  CB#  [phone]

## 2016-03-30 NOTE — Telephone Encounter (Signed)
Pt aware and med sent to pharm 

## 2016-04-07 ENCOUNTER — Telehealth: Payer: Self-pay | Admitting: Family Medicine

## 2016-04-07 NOTE — Telephone Encounter (Signed)
Patient would like Dr. Dennard Schaumann to call him at some point today. He has made an appt for next week to discuss prostate issues.  CB# [phone]

## 2016-04-16 ENCOUNTER — Ambulatory Visit: Payer: BC Managed Care – PPO | Admitting: Family Medicine

## 2016-05-11 HISTORY — PX: HIP ARTHROPLASTY: SHX981

## 2016-06-18 ENCOUNTER — Encounter: Payer: Self-pay | Admitting: Physician Assistant

## 2016-06-18 ENCOUNTER — Ambulatory Visit (INDEPENDENT_AMBULATORY_CARE_PROVIDER_SITE_OTHER): Payer: BC Managed Care – PPO | Admitting: Physician Assistant

## 2016-06-18 VITALS — BP 124/78 | HR 62 | Temp 97.4°F | Resp 16 | Wt 231.2 lb

## 2016-06-18 DIAGNOSIS — B9689 Other specified bacterial agents as the cause of diseases classified elsewhere: Secondary | ICD-10-CM

## 2016-06-18 DIAGNOSIS — J988 Other specified respiratory disorders: Secondary | ICD-10-CM

## 2016-06-18 MED ORDER — AMOXICILLIN-POT CLAVULANATE 875-125 MG PO TABS: 1.0000 | ORAL_TABLET | Freq: Two times a day (BID) | ORAL | 0 refills | Status: DC

## 2016-06-18 NOTE — Progress Notes (Signed)
Patient ID: LOR LAMERE MRN: [id number], DOB: January 28, 1951, 66 y.o. Date of Encounter: 06/18/2016, 9:26 AM    Chief Complaint:  Chief Complaint  Patient presents with  . Sinusitis    green mucus/ [id number]     HPI: 66 y.o. year old male presents with above.   Says this has been going on for a long time. Says that the entire time it's been some yellow mucus but he would occasionally have a "good day ". Therefore, did not come in sooner. However, it is not resolving and now the mucus has gotten even thicker and darker green. Says at one point he had some cough but now is no longer having any chest congestion. Now only has occasional cough secondary to drainage in his throat. Otherwise feels like it is all in his head and not in his chest. No fevers or chills. No significant sore throat.     Home Meds:   Outpatient Medications Prior to Visit  Medication Sig Dispense Refill  . albuterol (PROVENTIL HFA;VENTOLIN HFA) 108 (90 Base) MCG/ACT inhaler Inhale 2 puffs into the lungs every 6 (six) hours as needed for wheezing or shortness of breath. 1 Inhaler 0  . EPINEPHrine (EPIPEN) 0.3 mg/0.3 mL DEVI Inject 0.3 mLs (0.3 mg total) into the muscle once. 1 Device 1  . fexofenadine (ALLEGRA) 180 MG tablet Take 180 mg by mouth every evening.     . fluticasone (FLONASE) 50 MCG/ACT nasal spray Use 2 sprays in each  nostril daily 48 g 3  . Multiple Vitamin (MULTIVITAMIN) tablet Take 1 tablet by mouth daily.    . rosuvastatin (CRESTOR) 10 MG tablet Take 1 tablet (10 mg total) by mouth daily. 90 tablet 3  . doxycycline (VIBRA-TABS) 100 MG tablet Take 1 tablet (100 mg total) by mouth 2 (two) times daily. (Patient not taking: Reported on 06/18/2016) 20 tablet 0  . meloxicam (MOBIC) 15 MG tablet Take 15 mg by mouth daily.      No facility-administered medications prior to visit.     Allergies:  Allergies  Allergen Reactions  . Lotrel [Amlodipine Besy-Benazepril Hcl] Hives and Swelling  . Other Swelling      lycra and spandex=hives and swelling  . Chalk Other (See Comments)    sneezing      Review of Systems: See HPI for pertinent ROS. All other ROS negative.    Physical Exam: Blood pressure 124/78, pulse 62, temperature 97.4 F (36.3 C), temperature source Oral, resp. rate 16, weight 231 lb 3.2 oz (104.9 kg), SpO2 98 %., Body mass index is 29.68 kg/m. General: WNWD WM.  Appears in no acute distress. HEENT: Normocephalic, atraumatic, eyes without discharge, sclera non-icteric, nares are without discharge. Bilateral auditory canals clear, TM's are without perforation, pearly grey and translucent with reflective cone of light bilaterally. Oral cavity moist, posterior pharynx without exudate, erythema, peritonsillar abscess. No tenderness with percussion to frontal or maxillary sinuses bilaterally.  Neck: Supple. No thyromegaly. No lymphadenopathy. Lungs: Clear bilaterally to auscultation without wheezes, rales, or rhonchi. Breathing is unlabored. Heart: Regular rhythm. No murmurs, rubs, or gallops. Msk:  Strength and tone normal for age. Extremities/Skin: Warm and dry.  Neuro: Alert and oriented X 3. Moves all extremities spontaneously. Gait is normal. CNII-XII grossly in tact. Psych:  Responds to questions appropriately with a normal affect.     ASSESSMENT AND PLAN:  66 y.o. year old male with  1. Bacterial respiratory infection He is to take the Augmentin as directed and  complete all of it. Follow-up if symptoms do not resolve upon completion of antibiotic. - amoxicillin-clavulanate (AUGMENTIN) 875-125 MG tablet; Take 1 tablet by mouth 2 (two) times daily.  Dispense: 20 tablet; Refill: 0   Signed, [address] [place], [place], [facility] 06/18/2016 9:26 AM

## 2016-09-15 ENCOUNTER — Encounter: Payer: Self-pay | Admitting: Family Medicine

## 2016-09-15 ENCOUNTER — Ambulatory Visit (INDEPENDENT_AMBULATORY_CARE_PROVIDER_SITE_OTHER): Payer: BC Managed Care – PPO | Admitting: Family Medicine

## 2016-09-15 VITALS — BP 136/78 | HR 60 | Temp 98.0°F | Resp 18 | Ht 74.0 in | Wt 232.0 lb

## 2016-09-15 DIAGNOSIS — J301 Allergic rhinitis due to pollen: Secondary | ICD-10-CM | POA: Diagnosis not present

## 2016-09-15 MED ORDER — FEXOFENADINE HCL 180 MG PO TABS: 180.0000 mg | ORAL_TABLET | Freq: Every evening | ORAL | 0 refills | Status: DC

## 2016-09-15 NOTE — Progress Notes (Signed)
Subjective:    Patient ID: Thomas Livingston, male    DOB: Aug 25, 1950, 66 y.o.   MRN: [id number]  HPI Symptoms began to an days ago. He reports congestion in his left nostril greater than his right nostril. He reports sinus pressure in his left maxillary and left frontal sinus. Last week he had a low-grade fever. All the symptoms are starting to improve however he continues to have sinus pressure and rhinorrhea. He denies any dental pain. He denies any fevers. He denies any sore throat. He denies any cough. He is not taking any allergy medicine however he is aware that allergy seem to trigger this. Past Medical History:  Diagnosis Date  . Allergy   . Arthritis    left hip  . Hyperlipidemia   . Hypertension 2012   no bp meds since 2012  . Multiple lung nodules on CT 2007   no current follow up done  . Psoriasis    both legs   Past Surgical History:  Procedure Laterality Date  . CHOLECYSTECTOMY  2007  . EUS N/A 06/28/2014   Procedure: LOWER ENDOSCOPIC ULTRASOUND (EUS);  Surgeon: Milus Banister, MD;  Location: [facility];  Service: Endoscopy;  Laterality: N/A;  . [place]  . PROCTOSCOPY N/A 08/27/2014   Procedure: PROCTOSCOPY;  Surgeon: Leighton Ruff, MD;  Location: [facility];  Service: General;  Laterality: N/A;  . TRANSANAL RECTAL RESECTION N/A 08/27/2014   Procedure: TRANSANAL RESECTION OF RECTAL TUMOR;  Surgeon: Leighton Ruff, MD;  Location: [facility];  Service: General;  Laterality: N/A;   Current Outpatient Prescriptions on File Prior to Visit  Medication Sig Dispense Refill  . albuterol (PROVENTIL HFA;VENTOLIN HFA) 108 (90 Base) MCG/ACT inhaler Inhale 2 puffs into the lungs every 6 (six) hours as needed for wheezing or shortness of breath. 1 Inhaler 0  . EPINEPHrine (EPIPEN) 0.3 mg/0.3 mL DEVI Inject 0.3 mLs (0.3 mg total) into the muscle once. 1 Device 1  . fluticasone (FLONASE) 50 MCG/ACT nasal spray Use 2 sprays in each  nostril daily 48 g 3  . meloxicam (MOBIC) 15 MG  tablet Take 15 mg by mouth daily.     . Multiple Vitamin (MULTIVITAMIN) tablet Take 1 tablet by mouth daily.    . rosuvastatin (CRESTOR) 10 MG tablet Take 1 tablet (10 mg total) by mouth daily. 90 tablet 3   No current facility-administered medications on file prior to visit.    Allergies  Allergen Reactions  . Lotrel [Amlodipine Besy-Benazepril Hcl] Hives and Swelling  . Other Swelling    lycra and spandex=hives and swelling  . Chalk Other (See Comments)    sneezing   Social History   Social History  . Marital status: Married    Spouse name: N/A  . Number of children: N/A  . Years of education: N/A   Occupational History  . Not on file.   Social History Main Topics  . Smoking status: Never Smoker  . Smokeless tobacco: Never Used  . Alcohol use No  . Drug use: No  . Sexual activity: Not on file   Other Topics Concern  . Not on file   Social History Narrative  . No narrative on file      Review of Systems  All other systems reviewed and are negative.      Objective:   Physical Exam  Constitutional: He appears well-developed and well-nourished.  HENT:  Right Ear: External ear normal.  Left Ear: External ear normal.  Nose: Mucosal edema and rhinorrhea present. Left sinus exhibits no maxillary sinus tenderness and no frontal sinus tenderness.  Mouth/Throat: Oropharynx is clear and moist. No oropharyngeal exudate.  Neck: Neck supple.  Cardiovascular: Normal rate, regular rhythm and normal heart sounds.   Pulmonary/Chest: Effort normal and breath sounds normal. No respiratory distress. He has no wheezes. He has no rales.  Vitals reviewed.         Assessment & Plan:  Seasonal allergic rhinitis due to pollen  I recommended the patient begin a combination of Flonase 2 sprays each nostril daily and Allegra 180 mg by mouth daily and recheck in one week if symptoms persist.  Recheck sooner if the patient develops fever or pain

## 2016-10-12 ENCOUNTER — Other Ambulatory Visit: Payer: Self-pay | Admitting: Family Medicine

## 2016-10-29 ENCOUNTER — Ambulatory Visit (INDEPENDENT_AMBULATORY_CARE_PROVIDER_SITE_OTHER): Payer: BC Managed Care – PPO | Admitting: Family Medicine

## 2016-10-29 ENCOUNTER — Encounter: Payer: Self-pay | Admitting: Family Medicine

## 2016-10-29 VITALS — BP 136/72 | HR 80 | Temp 98.2°F | Resp 18 | Ht 74.0 in | Wt 232.0 lb

## 2016-10-29 DIAGNOSIS — T7840XA Allergy, unspecified, initial encounter: Secondary | ICD-10-CM

## 2016-10-29 DIAGNOSIS — L509 Urticaria, unspecified: Secondary | ICD-10-CM

## 2016-10-29 MED ORDER — PREDNISONE 20 MG PO TABS: ORAL_TABLET | ORAL | 0 refills | Status: DC

## 2016-10-29 MED ORDER — EPINEPHRINE 0.3 MG/0.3ML IJ SOAJ: 0.3000 mg | Freq: Once | INTRAMUSCULAR | 1 refills | Status: AC

## 2016-10-29 NOTE — Progress Notes (Signed)
Subjective:    Patient ID: Thomas Livingston, male    DOB: 01/19/1951, 66 y.o.   MRN: [id number]  HPI Patient awoke this morning with diffuse widespread urticaria over most of his torso. The worst areas on his right flank and his back. There are also hives in his groin and on his right leg.  There are also several bumps on his neck. There is no swelling in his lip. There is no swelling in this time. He denies any difficulty breathing. He denies any known new exposure Past Medical History:  Diagnosis Date  . Allergy   . Arthritis    left hip  . Hyperlipidemia   . Hypertension 2012   no bp meds since 2012  . Multiple lung nodules on CT 2007   no current follow up done  . Psoriasis    both legs   Past Surgical History:  Procedure Laterality Date  . CHOLECYSTECTOMY  2007  . EUS N/A 06/28/2014   Procedure: LOWER ENDOSCOPIC ULTRASOUND (EUS);  Surgeon: Milus Banister, MD;  Location: [facility];  Service: Endoscopy;  Laterality: N/A;  . [place]  . PROCTOSCOPY N/A 08/27/2014   Procedure: PROCTOSCOPY;  Surgeon: Leighton Ruff, MD;  Location: [facility];  Service: General;  Laterality: N/A;  . TRANSANAL RECTAL RESECTION N/A 08/27/2014   Procedure: TRANSANAL RESECTION OF RECTAL TUMOR;  Surgeon: Leighton Ruff, MD;  Location: [facility];  Service: General;  Laterality: N/A;   Current Outpatient Prescriptions on File Prior to Visit  Medication Sig Dispense Refill  . albuterol (PROVENTIL HFA;VENTOLIN HFA) 108 (90 Base) MCG/ACT inhaler Inhale 2 puffs into the lungs every 6 (six) hours as needed for wheezing or shortness of breath. 1 Inhaler 0  . fexofenadine (ALLEGRA) 180 MG tablet TAKE 1 TABLET BY MOUTH EVERY EVENING 90 tablet 3  . fluticasone (FLONASE) 50 MCG/ACT nasal spray Use 2 sprays in each  nostril daily 48 g 3  . meloxicam (MOBIC) 15 MG tablet Take 15 mg by mouth daily.     . Multiple Vitamin (MULTIVITAMIN) tablet Take 1 tablet by mouth daily.    . rosuvastatin (CRESTOR) 10 MG tablet  Take 1 tablet (10 mg total) by mouth daily. 90 tablet 3   No current facility-administered medications on file prior to visit.    Allergies  Allergen Reactions  . Lotrel [Amlodipine Besy-Benazepril Hcl] Hives and Swelling  . Other Swelling    lycra and spandex=hives and swelling  . Chalk Other (See Comments)    sneezing   Social History   Social History  . Marital status: Married    Spouse name: N/A  . Number of children: N/A  . Years of education: N/A   Occupational History  . Not on file.   Social History Main Topics  . Smoking status: Never Smoker  . Smokeless tobacco: Never Used  . Alcohol use No  . Drug use: No  . Sexual activity: Not on file   Other Topics Concern  . Not on file   Social History Narrative  . No narrative on file      Review of Systems  All other systems reviewed and are negative.      Objective:   Physical Exam  Constitutional: He appears well-developed and well-nourished.  Neck: Neck supple.  Cardiovascular: Normal rate, regular rhythm and normal heart sounds.   No murmur heard. Pulmonary/Chest: Effort normal and breath sounds normal. No stridor. No respiratory distress. He has no wheezes. He has no  rales.  Lymphadenopathy:    He has no cervical adenopathy.  Skin: Rash noted.  Vitals reviewed.         Assessment & Plan:  Urticaria  Allergic reaction, initial encounter - Plan: EPINEPHrine (EPIPEN) 0.3 mg/0.3 mL IJ SOAJ injection  The patient has a urticaria. Begin Claritin 10 mg twice daily as well as Pepcid. Begin prednisone taper pack. Reassess in one week. Hopefully the antihistamines will prevent urticaria from returning after prednisone is out of his system. I did refill his EpiPen just in case. If lesions persist, consider adding Singulair versus allergy testing

## 2016-12-20 ENCOUNTER — Other Ambulatory Visit: Payer: Self-pay | Admitting: Family Medicine

## 2017-02-23 ENCOUNTER — Ambulatory Visit (INDEPENDENT_AMBULATORY_CARE_PROVIDER_SITE_OTHER): Payer: BC Managed Care – PPO | Admitting: Family Medicine

## 2017-02-23 ENCOUNTER — Encounter: Payer: Self-pay | Admitting: Family Medicine

## 2017-02-23 VITALS — BP 130/86 | HR 70 | Temp 97.9°F | Resp 16 | Wt 238.0 lb

## 2017-02-23 DIAGNOSIS — M545 Low back pain, unspecified: Secondary | ICD-10-CM

## 2017-02-23 MED ORDER — DICLOFENAC SODIUM 75 MG PO TBEC: 75.0000 mg | DELAYED_RELEASE_TABLET | Freq: Two times a day (BID) | ORAL | 0 refills | Status: DC

## 2017-02-23 NOTE — Progress Notes (Signed)
Subjective:    Patient ID: Thomas Livingston, male    DOB: 10/05/1950, 66 y.o.   MRN: [id number]  HPI Reports low back pain.  The pain has been present for less than a week. It is located in the center of his backaround the level of L4-L5. There is no radiation. The pain is made worse by prolonged standing and made better by sitting He denies any dysuria urgency, frequency, or hematuria. He denies any weakness in his legs or numbness in his legs. Straight leg raise is negative. Reflexes are normal. Muscle strength is 5 over 5 equal and symmetric in the lower extremities Past Medical History:  Diagnosis Date  . Allergy   . Arthritis    left hip  . Hyperlipidemia   . Hypertension 2012   no bp meds since 2012  . Multiple lung nodules on CT 2007   no current follow up done  . Psoriasis    both legs   Past Surgical History:  Procedure Laterality Date  . CHOLECYSTECTOMY  2007  . EUS N/A 06/28/2014   Procedure: LOWER ENDOSCOPIC ULTRASOUND (EUS);  Surgeon: Milus Banister, MD;  Location: [facility];  Service: Endoscopy;  Laterality: N/A;  . [place]  . PROCTOSCOPY N/A 08/27/2014   Procedure: PROCTOSCOPY;  Surgeon: Leighton Ruff, MD;  Location: [facility];  Service: General;  Laterality: N/A;  . TRANSANAL RECTAL RESECTION N/A 08/27/2014   Procedure: TRANSANAL RESECTION OF RECTAL TUMOR;  Surgeon: Leighton Ruff, MD;  Location: [facility];  Service: General;  Laterality: N/A;   Current Outpatient Prescriptions on File Prior to Visit  Medication Sig Dispense Refill  . fexofenadine (ALLEGRA) 180 MG tablet TAKE 1 TABLET BY MOUTH EVERY EVENING 90 tablet 3  . fluticasone (FLONASE) 50 MCG/ACT nasal spray Use 2 sprays in each  nostril daily 48 g 3  . Multiple Vitamin (MULTIVITAMIN) tablet Take 1 tablet by mouth daily.    . rosuvastatin (CRESTOR) 10 MG tablet TAKE 1 TABLET BY MOUTH EVERY DAY 90 tablet 3   No current facility-administered medications on file prior to visit.    Allergies    Allergen Reactions  . Lotrel [Amlodipine Besy-Benazepril Hcl] Hives and Swelling  . Other Swelling    lycra and spandex=hives and swelling  . Chalk Other (See Comments)    sneezing   Social History   Social History  . Marital status: Married    Spouse name: N/A  . Number of children: N/A  . Years of education: N/A   Occupational History  . Not on file.   Social History Main Topics  . Smoking status: Never Smoker  . Smokeless tobacco: Never Used  . Alcohol use No  . Drug use: No  . Sexual activity: Not on file   Other Topics Concern  . Not on file   Social History Narrative  . No narrative on file      Review of Systems  Musculoskeletal: Positive for back pain.  All other systems reviewed and are negative.      Objective:   Physical Exam  Constitutional: He appears well-developed and well-nourished.  Neck: Neck supple.  Cardiovascular: Normal rate, regular rhythm and normal heart sounds.   No murmur heard. Pulmonary/Chest: Effort normal and breath sounds normal. No stridor. No respiratory distress. He has no wheezes. He has no rales.  Lymphadenopathy:    He has no cervical adenopathy.  Vitals reviewed.         Assessment & Plan:  Acute low back pain without sciatica, unspecified back pain laterality  Suspect DDD.  Recommend diclofenac 75 mg pobid and recheck in 2 weeks.  Obtain L-spine xray.

## 2017-02-25 ENCOUNTER — Ambulatory Visit
Admission: RE | Admit: 2017-02-25 | Discharge: 2017-02-25 | Disposition: A | Discharge status: Home / Self Care | Payer: BC Managed Care – PPO | Source: Ambulatory Visit | Attending: Family Medicine | Admitting: Family Medicine

## 2017-02-25 DIAGNOSIS — M545 Low back pain, unspecified: Secondary | ICD-10-CM

## 2017-03-09 ENCOUNTER — Encounter: Payer: Self-pay | Admitting: Family Medicine

## 2017-03-09 ENCOUNTER — Ambulatory Visit (INDEPENDENT_AMBULATORY_CARE_PROVIDER_SITE_OTHER): Payer: BC Managed Care – PPO | Admitting: Family Medicine

## 2017-03-09 VITALS — BP 120/80 | HR 64 | Temp 97.4°F | Resp 18 | Ht 74.0 in | Wt 236.0 lb

## 2017-03-09 DIAGNOSIS — Z125 Encounter for screening for malignant neoplasm of prostate: Secondary | ICD-10-CM | POA: Diagnosis not present

## 2017-03-09 DIAGNOSIS — Z0000 Encounter for general adult medical examination without abnormal findings: Secondary | ICD-10-CM | POA: Diagnosis not present

## 2017-03-09 DIAGNOSIS — E7800 Pure hypercholesterolemia, unspecified: Secondary | ICD-10-CM | POA: Diagnosis not present

## 2017-03-09 NOTE — Progress Notes (Signed)
Subjective:    Patient ID: Thomas Livingston, male    DOB: 08-13-1950, 66 y.o.   MRN: [id number]  HPI Patient is a very pleasant 66 year old white male who presents today for complete physical exam.  He has no concerns.  Immunizations are up-to-date aside from Shingrix. Immunization History  Administered Date(s) Administered  . Influenza,inj,Quad PF,6+ Mos 02/27/2014, 06/25/2015, 02/20/2016  . Influenza-Unspecified 03/02/2017  . Tdap 11/14/2009, 12/16/2013  . Zoster 02/27/2014   His colonoscopy was in 2016.  He is due for a prostate exam today.  Hepatitis C screening is up-to-date.  He declines HIV screening.  Otherwise he is doing well with no concerns Past Medical History:  Diagnosis Date  . Allergy   . Arthritis    left hip  . Hyperlipidemia   . Hypertension 2012   no bp meds since 2012  . Multiple lung nodules on CT 2007   no current follow up done  . Psoriasis    both legs   Past Surgical History:  Procedure Laterality Date  . CHOLECYSTECTOMY  2007  . EUS N/A 06/28/2014   Procedure: LOWER ENDOSCOPIC ULTRASOUND (EUS);  Surgeon: Milus Banister, MD;  Location: [facility];  Service: Endoscopy;  Laterality: N/A;  . [place]  . PROCTOSCOPY N/A 08/27/2014   Procedure: PROCTOSCOPY;  Surgeon: Leighton Ruff, MD;  Location: [facility];  Service: General;  Laterality: N/A;  . TRANSANAL RECTAL RESECTION N/A 08/27/2014   Procedure: TRANSANAL RESECTION OF RECTAL TUMOR;  Surgeon: Leighton Ruff, MD;  Location: [facility];  Service: General;  Laterality: N/A;   Current Outpatient Prescriptions on File Prior to Visit  Medication Sig Dispense Refill  . aspirin 81 MG tablet Take 81 mg by mouth daily.    . diclofenac (VOLTAREN) 75 MG EC tablet Take 1 tablet (75 mg total) by mouth 2 (two) times daily. 30 tablet 0  . fexofenadine (ALLEGRA) 180 MG tablet TAKE 1 TABLET BY MOUTH EVERY EVENING 90 tablet 3  . fluticasone (FLONASE) 50 MCG/ACT nasal spray Use 2 sprays in each  nostril daily 48 g  3  . Multiple Vitamin (MULTIVITAMIN) tablet Take 1 tablet by mouth daily.    . rosuvastatin (CRESTOR) 10 MG tablet TAKE 1 TABLET BY MOUTH EVERY DAY 90 tablet 3   No current facility-administered medications on file prior to visit.    Allergies  Allergen Reactions  . Lotrel [Amlodipine Besy-Benazepril Hcl] Hives and Swelling  . Other Swelling    lycra and spandex=hives and swelling  . Chalk Other (See Comments)    sneezing   Social History   Social History  . Marital status: Married    Spouse name: N/A  . Number of children: N/A  . Years of education: N/A   Occupational History  . Not on file.   Social History Main Topics  . Smoking status: Never Smoker  . Smokeless tobacco: Never Used  . Alcohol use No  . Drug use: No  . Sexual activity: Not on file   Other Topics Concern  . Not on file   Social History Narrative  . No narrative on file   Family History  Problem Relation Age of Onset  . Thyroid cancer Mother   . Lung cancer Father   . Colon cancer Neg Hx       Review of Systems  All other systems reviewed and are negative.      Objective:   Physical Exam  Constitutional: He is oriented to person, place,  and time. He appears well-developed and well-nourished. No distress.  HENT:  Head: Normocephalic and atraumatic.  Right Ear: External ear normal.  Left Ear: External ear normal.  Nose: Nose normal.  Mouth/Throat: Oropharynx is clear and moist. No oropharyngeal exudate.  Eyes: Pupils are equal, round, and reactive to light. Conjunctivae and EOM are normal. Right eye exhibits no discharge. Left eye exhibits no discharge. No scleral icterus.  Neck: Normal range of motion. Neck supple. No JVD present. No tracheal deviation present. No thyromegaly present.  Cardiovascular: Normal rate, regular rhythm, normal heart sounds and intact distal pulses.  Exam reveals no gallop and no friction rub.   No murmur heard. Pulmonary/Chest: Effort normal and breath sounds  normal. No stridor. No respiratory distress. He has no wheezes. He has no rales. He exhibits no tenderness.  Abdominal: Soft. Bowel sounds are normal. He exhibits no distension and no mass. There is no tenderness. There is no rebound and no guarding.  Genitourinary: Rectum normal and prostate normal.  Musculoskeletal: Normal range of motion. He exhibits no edema, tenderness or deformity.  Lymphadenopathy:    He has no cervical adenopathy.  Neurological: He is alert and oriented to person, place, and time. He has normal reflexes. He displays normal reflexes. No cranial nerve deficit. He exhibits normal muscle tone. Coordination normal.  Skin: Skin is warm. No rash noted. He is not diaphoretic. No erythema. No pallor.  Psychiatric: He has a normal mood and affect. His behavior is normal. Judgment and thought content normal.  Vitals reviewed.         Assessment & Plan:  Pure hypercholesterolemia - Plan: CBC with Differential/Platelet, COMPLETE METABOLIC PANEL WITH GFR, Lipid panel  Prostate cancer screening - Plan: PSA Gen Med Exam Sickle exam today is completely normal.  I will screen the patient for prostate cancer with a PSA.  His blood pressure is excellent.  I will check a CBC, CMP, fasting lipid panel to screen for hyperlipidemia.  Colonoscopy is up-to-date.  Also exam is normal.  Immunizations are up-to-date.  I did recommend the shingles vaccine.  Otherwise his preventative care is up-to-date.

## 2017-03-18 ENCOUNTER — Other Ambulatory Visit: Payer: BC Managed Care – PPO

## 2017-03-18 LAB — LIPID PANEL
Cholesterol: 150 mg/dL (ref <200)
HDL: 42 mg/dL (ref >40)
LDL Cholesterol (Calc): 87 mg/dL (calc)
Non-HDL Cholesterol (Calc): 108 mg/dL (calc) (ref <130)
Total CHOL/HDL Ratio: 3.6 (calc) (ref <5.0)
Triglycerides: 112 mg/dL (ref <150)

## 2017-03-18 LAB — CBC WITH DIFFERENTIAL/PLATELET
Basophils Absolute: 76 cells/uL (ref 0–200)
Basophils Relative: 1 %
Eosinophils Absolute: 486 cells/uL (ref 15–500)
Eosinophils Relative: 6.4 %
HCT: 45.1 % (ref 38.5–50.0)
Hemoglobin: 15.3 g/dL (ref 13.2–17.1)
Lymphs Abs: 889 cells/uL (ref 850–3900)
MCH: 30.5 pg (ref 27.0–33.0)
MCHC: 33.9 g/dL (ref 32.0–36.0)
MCV: 89.8 fL (ref 80.0–100.0)
MPV: 10.8 fL (ref 7.5–12.5)
Monocytes Relative: 8.8 %
Neutro Abs: 5480 cells/uL (ref 1500–7800)
Neutrophils Relative %: 72.1 %
Platelets: 276 10*3/uL (ref 140–400)
RBC: 5.02 10*6/uL (ref 4.20–5.80)
RDW: 12.5 % (ref 11.0–15.0)
Total Lymphocyte: 11.7 %
WBC mixed population: 669 cells/uL (ref 200–950)
WBC: 7.6 10*3/uL (ref 3.8–10.8)

## 2017-03-18 LAB — COMPLETE METABOLIC PANEL WITH GFR
AG Ratio: 1.4 (calc) (ref 1.0–2.5)
ALT: 28 U/L (ref 9–46)
AST: 26 U/L (ref 10–35)
Albumin: 3.8 g/dL (ref 3.6–5.1)
Alkaline phosphatase (APISO): 78 U/L (ref 40–115)
BUN: 12 mg/dL (ref 7–25)
CO2: 29 mmol/L (ref 20–32)
Calcium: 9.1 mg/dL (ref 8.6–10.3)
Chloride: 103 mmol/L (ref 98–110)
Creat: 0.81 mg/dL (ref 0.70–1.25)
GFR, Est African American: 109 mL/min/{1.73_m2} (ref >=60)
GFR, Est Non African American: 94 mL/min/{1.73_m2} (ref >=60)
Globulin: 2.7 g/dL (calc) (ref 1.9–3.7)
Glucose, Bld: 93 mg/dL (ref 65–99)
Potassium: 4.8 mmol/L (ref 3.5–5.3)
Sodium: 139 mmol/L (ref 135–146)
Total Bilirubin: 0.8 mg/dL (ref 0.2–1.2)
Total Protein: 6.5 g/dL (ref 6.1–8.1)

## 2017-03-18 LAB — PSA: PSA: 0.6 ng/mL (ref <=4.0)

## 2017-08-26 ENCOUNTER — Ambulatory Visit: Payer: BC Managed Care – PPO | Admitting: Family Medicine

## 2017-08-26 ENCOUNTER — Encounter: Payer: Self-pay | Admitting: Family Medicine

## 2017-08-26 ENCOUNTER — Other Ambulatory Visit: Payer: Self-pay

## 2017-08-26 VITALS — BP 128/68 | HR 66 | Temp 97.9°F | Resp 14 | Ht 74.0 in | Wt 241.0 lb

## 2017-08-26 DIAGNOSIS — L509 Urticaria, unspecified: Secondary | ICD-10-CM | POA: Diagnosis not present

## 2017-08-26 DIAGNOSIS — T783XXD Angioneurotic edema, subsequent encounter: Secondary | ICD-10-CM | POA: Diagnosis not present

## 2017-08-26 MED ORDER — METHYLPREDNISOLONE ACETATE 80 MG/ML IJ SUSP
80.0000 mg | Freq: Once | INTRAMUSCULAR | Status: AC
  Administered 2017-08-26: 80 mg via INTRAMUSCULAR

## 2017-08-26 MED ORDER — FAMOTIDINE 20 MG PO TABS: 20.0000 mg | ORAL_TABLET | Freq: Two times a day (BID) | ORAL | 0 refills | Status: DC

## 2017-08-26 MED ORDER — PREDNISONE 20 MG PO TABS: ORAL_TABLET | ORAL | 0 refills | Status: DC

## 2017-08-26 NOTE — Progress Notes (Signed)
   Subjective:    Patient ID: Thomas Livingston, male    DOB: December 22, 1950, 67 y.o.   MRN: [id number]  Patient presents for Hives (x2 weeks- swelling to upper lips, hives to R side)  Pt here with hives and swelling for the past 2 weeks.  He has history of urticaria treated last in June 2018, he has allergy to latex and certain clothing as well as certain types of plastic. No new medications He has underlying allergies  He went to an ER in [place] he had already had hives on his waist before he left he took Benadryl and that subsided some but did worsen once he got to [place].  He did not eat or drink anything different has no known specific food allergies.  He does remember getting bit by a tick 7 weeks ago he now queries if he has this tick allergy.  Describes prednisone 40 mg for 5 days along with Allegra twice daily/benadryl  in the ER he was given Solu-Medrol as well as Pepcid/benadryl. He continues to have significant hives on his trunk area as well as swelling of his upper lip and on his arms hands have gone down. [place] any difficulty breathing or wheezing   Review Of Systems:  GEN- denies fatigue, fever, weight loss,weakness, recent illness HEENT- denies eye drainage, change in vision, nasal discharge, CVS- denies chest pain, palpitations RESP- denies SOB, cough, wheeze ABD- denies N/V, change in stools, abd pain GU- denies dysuria, hematuria, dribbling, incontinence MSK- denies joint pain, muscle aches, injury Neuro- denies headache, dizziness, syncope, seizure activity       Objective:    BP 128/68   Pulse 66   Temp 97.9 F (36.6 C) (Oral)   Resp 14   Ht 6\' 2"  (1.88 m)   Wt 241 lb (109.3 kg)   SpO2 98%   BMI 30.94 kg/m  GEN- NAD, alert and oriented x3 HEENT- PERRL, EOMI, non injected sclera, pink conjunctiva, MMM, oropharynx clear, uvula midline  Neck- Supple, no LAD  CVS- RRR, no murmur RESP-CTAB Skin - Hives around waist, left abd/chest wall, bilat arms scatterted,  swelling upper lip        Assessment & Plan:      Problem List Items Addressed This Visit    None    Visit Diagnoses    Urticaria    -  Primary   recurrent episdoes, I think he needs allergy testing, with regards to tick, possible but has had hives in past years before. Given Depo Medrol 80mg , start taper at 60 mg, Pepcid 20 mg twice a day along with the Allegra.   Relevant Medications   methylPREDNISolone acetate (DEPO-MEDROL) injection 80 mg (Completed)   Angioedema, subsequent encounter       Relevant Medications   methylPREDNISolone acetate (DEPO-MEDROL) injection 80 mg (Completed)      Note: This dictation was prepared with Dragon dictation along with smaller phrase technology. Any transcriptional errors that result from this process are unintentional.

## 2017-08-26 NOTE — Patient Instructions (Signed)
Continue allegra twice a day for 2 weeks  Take pepcid twice a day for 2 weeks Take the new prednisone taper Referral to allergist  F/U if not improved

## 2017-08-30 ENCOUNTER — Other Ambulatory Visit: Payer: Self-pay

## 2017-08-30 MED ORDER — PREDNISONE 20 MG PO TABS: ORAL_TABLET | ORAL | 0 refills | Status: DC

## 2017-08-30 MED ORDER — FAMOTIDINE 20 MG PO TABS: 20.0000 mg | ORAL_TABLET | Freq: Two times a day (BID) | ORAL | 0 refills | Status: DC

## 2017-09-01 ENCOUNTER — Encounter: Payer: Self-pay | Admitting: Allergy and Immunology

## 2017-09-01 ENCOUNTER — Ambulatory Visit: Payer: BC Managed Care – PPO | Admitting: Allergy and Immunology

## 2017-09-01 VITALS — BP 126/74 | HR 88 | Temp 97.9°F | Resp 16 | Ht 72.0 in | Wt 233.0 lb

## 2017-09-01 DIAGNOSIS — L500 Allergic urticaria: Secondary | ICD-10-CM | POA: Diagnosis not present

## 2017-09-01 DIAGNOSIS — J4541 Moderate persistent asthma with (acute) exacerbation: Secondary | ICD-10-CM

## 2017-09-01 DIAGNOSIS — T7840XA Allergy, unspecified, initial encounter: Secondary | ICD-10-CM | POA: Diagnosis not present

## 2017-09-01 DIAGNOSIS — T783XXA Angioneurotic edema, initial encounter: Secondary | ICD-10-CM | POA: Diagnosis not present

## 2017-09-01 DIAGNOSIS — J3089 Other allergic rhinitis: Secondary | ICD-10-CM | POA: Diagnosis not present

## 2017-09-01 MED ORDER — ALBUTEROL SULFATE HFA 108 (90 BASE) MCG/ACT IN AERS: 1.0000 | INHALATION_SPRAY | Freq: Four times a day (QID) | RESPIRATORY_TRACT | 1 refills | Status: DC | PRN

## 2017-09-01 MED ORDER — AZITHROMYCIN 500 MG PO TABS: 500.0000 mg | ORAL_TABLET | Freq: Every day | ORAL | 0 refills | Status: AC

## 2017-09-01 MED ORDER — MONTELUKAST SODIUM 10 MG PO TABS: 10.0000 mg | ORAL_TABLET | Freq: Every day | ORAL | 5 refills | Status: DC

## 2017-09-01 MED ORDER — BUDESONIDE-FORMOTEROL FUMARATE 160-4.5 MCG/ACT IN AERO: 2.0000 | INHALATION_SPRAY | Freq: Two times a day (BID) | RESPIRATORY_TRACT | 5 refills | Status: DC

## 2017-09-01 MED ORDER — RANITIDINE HCL 150 MG PO CAPS: 150.0000 mg | ORAL_CAPSULE | Freq: Two times a day (BID) | ORAL | 5 refills | Status: DC

## 2017-09-01 MED ORDER — EPINEPHRINE 0.3 MG/0.3ML IJ SOAJ: 0.3000 mg | Freq: Once | INTRAMUSCULAR | 1 refills | Status: AC

## 2017-09-01 MED ORDER — CETIRIZINE HCL 10 MG PO TABS: 10.0000 mg | ORAL_TABLET | Freq: Two times a day (BID) | ORAL | 5 refills | Status: DC

## 2017-09-01 NOTE — Patient Instructions (Addendum)
  1.  Allergen avoidance measures?  2.  Treat and prevent reactions:   A.  Cetirizine 10 mg twice a day  B.  Ranitidine 150 mg twice a day  C.  Montelukast 10 mg daily  D.  Flonase 1-2 sprays each nostril daily  E.  Complete prednisone taper  3.  Treat and prevent inflammation:   A.  Symbicort 160 -2 inhalations twice a day with spacer  4.  Treat infection:   A.  Azithromycin 500 mg once a day for 3 days  5.  If needed:   A.  Pro-air HFA or similar to in relation to every 4-6 hours  B.  OTC Benadryl  C.  Auvi-Q 0.3, Benadryl, MD/ER evaluation for allergic reaction  6.  Blood - CBC w/diff, CMP, TSH, T4, TP, alpha gal  7.  Return to clinic 1 week or earlier if problem

## 2017-09-01 NOTE — Progress Notes (Signed)
Dear Dr. Buelah Manis,  Thank you for referring Thomas Livingston to the [facility] on 09/01/2017.   Below is a summation of this patient's evaluation and recommendations.  Thank you for your referral. I will keep you informed about this patient's response to treatment.   If you have any questions please do not hesitate to contact me.   Sincerely,  Jiles Prows, MD Allergy / Immunology [facility]   ______________________________________________________________________    NEW PATIENT NOTE  Referring Provider: Alycia Rossetti, MD Primary Provider: Susy Frizzle, MD Date of office visit: 09/01/2017    Subjective:   Chief Complaint:  Thomas Livingston (DOB: 1950-11-15) is a 67 y.o. male who presents to the clinic on 09/01/2017 with a chief complaint of Urticaria (statin) .     HPI: Thomas Livingston presents to this clinic in evaluation of allergic reaction.  His history dates back to approximately 2 years ago at which point in time he apparently developed lip swelling believed to be secondary to his ACE inhibitor.  He had discontinuation of that medication at that point and did well but in 2018 he developed another episode of lip swelling and maybe hives that was relatively short-lived and was treated with what sounds like antihistamines and possibly steroids.  He did very well up until approximately 10 days ago.  While on a vacation in [place] he developed global urticaria and the sensation of tongue fullness and lip swelling requiring him to go to the emergency room and get treated with systemic steroids.  Everything resolved and he did well for 2 days but then once again developed lip swelling and hives requiring evaluation with his primary care doctor who treated him with a systemic steroid injection and prednisone.  His hives were gone within 1 day and his lip swelling remained for about 3 days.   There was no other associated systemic or constitutional symptoms associated with this reaction.  There was no obvious provoking factor giving rise to this reaction.  On further questioning it did appear as though he may have had some waistband hives and a hive on the back of his neck about 5 days prior to his reaction while on vacation that he treated with a Benadryl tablet.  In addition, within 3 days of his reaction that occurred approximately 10 days ago he developed coughing and wheezing and postnasal drip and just felt bad in general.  This has continued and in fact has progressed over the course of the past several days.  He apparently had one previous episode of respiratory tract inflammation back in 2017 or 2018 in which she was given an albuterol inhaler.  He does not have a history of chronic lower respiratory tract symptoms and apparently does not have a history of exercise-induced asthma or cold air induced asthma.  He was around a grandson several days prior to the onset of his recent respiratory tract event who apparently had a cold.  He also has some issues with upper airway congestion and sneezing that he treats with Flonase successfully.  Past Medical History:  Diagnosis Date  . Allergy   . Arthritis    left hip  . Hyperlipidemia   . Hypertension 2012   no bp meds since 2012  . Multiple lung nodules on CT 2007   no current follow up done  . Psoriasis    both legs  . Urticaria  Past Surgical History:  Procedure Laterality Date  . CHOLECYSTECTOMY  2007  . EUS N/A 06/28/2014   Procedure: LOWER ENDOSCOPIC ULTRASOUND (EUS);  Surgeon: Milus Banister, MD;  Location: [facility];  Service: Endoscopy;  Laterality: N/A;  . [place]  . PROCTOSCOPY N/A 08/27/2014   Procedure: PROCTOSCOPY;  Surgeon: Leighton Ruff, MD;  Location: [facility];  Service: General;  Laterality: N/A;  . TRANSANAL RECTAL RESECTION N/A 08/27/2014   Procedure: TRANSANAL RESECTION OF RECTAL TUMOR;   Surgeon: Leighton Ruff, MD;  Location: [facility];  Service: General;  Laterality: N/A;    Allergies as of 09/01/2017      Reactions   Lotrel [amlodipine Besy-benazepril Hcl] Hives, Swelling   Other Swelling   lycra and spandex=hives and swelling   Chalk Other (See Comments)   sneezing      Medication List      aspirin 81 MG tablet Take 81 mg by mouth daily.   famotidine 20 MG tablet Commonly known as:  PEPCID Take 1 tablet (20 mg total) by mouth 2 (two) times daily.   fexofenadine 180 MG tablet Commonly known as:  ALLEGRA TAKE 1 TABLET BY MOUTH EVERY EVENING   fluticasone 50 MCG/ACT nasal spray Commonly known as:  FLONASE Use 2 sprays in each  nostril daily   multivitamin tablet Take 1 tablet by mouth daily.   predniSONE 20 MG tablet Commonly known as:  DELTASONE Take 60mg  x 3 days,40mg  x 3 days, 20mg  x 3 days, 10mg  x 2 days   rosuvastatin 10 MG tablet Commonly known as:  CRESTOR TAKE 1 TABLET BY MOUTH EVERY DAY       Review of systems negative except as noted in HPI / PMHx or noted below:  Review of Systems  Constitutional: Negative.   HENT: Negative.   Eyes: Negative.   Respiratory: Negative.   Cardiovascular: Negative.   Gastrointestinal: Negative.   Genitourinary: Negative.   Musculoskeletal: Negative.   Skin: Negative.   Neurological: Negative.   Endo/Heme/Allergies: Negative.   Psychiatric/Behavioral: Negative.     Family History  Problem Relation Age of Onset  . Thyroid cancer Mother   . Lung cancer Father   . Colon cancer Neg Hx     Social History   Socioeconomic History  . Marital status: Married    Spouse name: Not on file  . Number of children: Not on file  . Years of education: Not on file  . Highest education level: Not on file  Occupational History  . Not on file  Social Needs  . Financial resource strain: Not on file  . Food insecurity:    Worry: Not on file    Inability: Not on file  . Transportation needs:    Medical:  Not on file    Non-medical: Not on file  Tobacco Use  . Smoking status: Never Smoker  . Smokeless tobacco: Never Used  Substance and Sexual Activity  . Alcohol use: No  . Drug use: No  . Sexual activity: Not on file  Lifestyle  . Physical activity:    Days per week: Not on file    Minutes per session: Not on file  . Stress: Not on file  Relationships  . Social connections:    Talks on phone: Not on file    Gets together: Not on file    Attends religious service: Not on file    Active member of club or organization: Not on file    Attends meetings  of clubs or organizations: Not on file    Relationship status: Not on file  . Intimate partner violence:    Fear of current or ex partner: Not on file    Emotionally abused: Not on file    Physically abused: Not on file    Forced sexual activity: Not on file  Other Topics Concern  . Not on file  Social History Narrative  . Not on file    Environmental and Social history  Lives in a house with a dry environment, a dog located inside the household, hardwood in the bedroom, no plastic on the bed, no plastic on the pillow, no smokers located inside the household, and employment as a Animal nutritionist at [employer].  Objective:   Vitals:   09/01/17 0903  BP: 126/74  Pulse: 88  Resp: 16  Temp: 97.9 F (36.6 C)  SpO2: 98%   Height: 6' (182.9 cm) Weight: 233 lb (105.7 kg)  Physical Exam  HENT:  Head: Normocephalic. Head is without right periorbital erythema and without left periorbital erythema.  Right Ear: Tympanic membrane, external ear and ear canal normal.  Left Ear: Tympanic membrane, external ear and ear canal normal.  Nose: Nose normal. No mucosal edema or rhinorrhea.  Mouth/Throat: Oropharynx is clear and moist and mucous membranes are normal. No oropharyngeal exudate.  Eyes: Pupils are equal, round, and reactive to light. Conjunctivae and lids are normal.  Neck: Trachea normal. No tracheal  deviation present. No thyromegaly present.  Cardiovascular: Normal rate, regular rhythm, S1 normal, S2 normal and normal heart sounds.  No murmur heard. Pulmonary/Chest: Effort normal. No stridor. No respiratory distress. He has wheezes (Bilateral inspiratory and expiratory wheezing all lung fields). He has no rales. He exhibits no tenderness.  Abdominal: Soft. He exhibits no distension and no mass. There is no hepatosplenomegaly. There is no tenderness. There is no rebound and no guarding.  Musculoskeletal: He exhibits no edema or tenderness.  Lymphadenopathy:       Head (right side): No tonsillar adenopathy present.       Head (left side): No tonsillar adenopathy present.    He has no cervical adenopathy.    He has no axillary adenopathy.  Neurological: He is alert.  Skin: No rash noted. He is not diaphoretic. No erythema. No pallor. Nails show no clubbing.    Diagnostics: Allergy skin tests were performed.  He demonstrated hypersensitivity to house dust mite, cat, dog, and mold.  He did not demonstrate any hypersensitivity to his screening panel of foods.  Spirometry was performed and demonstrated an FEV1 of 1.80 @ 47 % of predicted. FEV1/FVC = 0.68.  Following administration of nebulized albuterol his FEV1 rose to 2.14 which was 56% of predicted and calculated out to increase in the FEV1 of 19%.  Results of blood tests obtained 18 March 2017 identified normal hepatic and renal function, WBC 7.6, absolute eosinophil 486, absolute lymphocyte 889, hemoglobin 15.3, platelet 276    Assessment and Plan:    1. Allergic reaction, initial encounter   2. Angioedema, initial encounter   3. Allergic urticaria   4. Asthma, not well controlled, moderate persistent, with acute exacerbation   5. Other allergic rhinitis     1.  Allergen avoidance measures?  2.  Treat and prevent reactions:   A.  Cetirizine 10 mg twice a day  B.  Ranitidine 150 mg twice a day  C.  Montelukast 10 mg  daily  D.  Flonase 1-2 sprays each nostril  daily  E.  Complete prednisone taper  3.  Treat and prevent inflammation:   A.  Symbicort 160 -2 inhalations twice a day with spacer  4.  Treat infection:   A.  Azithromycin 500 mg once a day for 3 days  5.  If needed:   A.  Pro-air HFA or similar to in relation to every 4-6 hours  B.  OTC Benadryl  C.  Auvi-Q 0.3, Benadryl, MD/ER evaluation for allergic reaction  6.  Blood - CBC w/diff, CMP, TSH, T4, TP, alpha gal  7.  Return to clinic 1 week or earlier if problem  Thomas Livingston has a very significant immunological hyperreactivity.  Part of this issue is related to exposure to specific aeroallergens and we will get him to avoid these aeroallergens as best as possible.  There are probably other etiologic factors contributing to his immunological hyperactivity although they are not particularly obvious at this point and we will have him undergo further evaluation with blood tests looking for a systemic condition contributing to this issue.  I have given him azithromycin to cover the possibility of mycoplasma infection contributing to some of his respiratory tract symptoms and he will use a large collection of medications directed against immunological hyperreactivity and respiratory tract inflammation.  I will regroup with him in 1 week or earlier if there is a problem.  Jiles Prows, MD Allergy / Immunology [facility]

## 2017-09-02 ENCOUNTER — Encounter: Payer: Self-pay | Admitting: Allergy and Immunology

## 2017-09-02 LAB — CBC WITH DIFFERENTIAL/PLATELET
Basophils Absolute: 0 10*3/uL (ref 0.0–0.2)
Basos: 0 %
EOS (ABSOLUTE): 0.4 10*3/uL (ref 0.0–0.4)
Eos: 3 %
Hematocrit: 45.6 % (ref 37.5–51.0)
Hemoglobin: 15.5 g/dL (ref 13.0–17.7)
Immature Grans (Abs): 0.1 10*3/uL (ref 0.0–0.1)
Immature Granulocytes: 1 %
Lymphocytes Absolute: 1.5 10*3/uL (ref 0.7–3.1)
Lymphs: 14 %
MCH: 30.9 pg (ref 26.6–33.0)
MCHC: 34 g/dL (ref 31.5–35.7)
MCV: 91 fL (ref 79–97)
Monocytes Absolute: 1.9 10*3/uL — ABNORMAL HIGH (ref 0.1–0.9)
Monocytes: 18 %
Neutrophils Absolute: 6.7 10*3/uL (ref 1.4–7.0)
Neutrophils: 64 %
Platelets: 252 10*3/uL (ref 150–379)
RBC: 5.02 x10E6/uL (ref 4.14–5.80)
RDW: 13.9 % (ref 12.3–15.4)
WBC: 10.6 10*3/uL (ref 3.4–10.8)

## 2017-09-02 LAB — THYROID PEROXIDASE ANTIBODY: Thyroperoxidase Ab SerPl-aCnc: <6 IU/mL (ref 0–34)

## 2017-09-02 LAB — T4, FREE: Free T4: 1.36 ng/dL (ref 0.82–1.77)

## 2017-09-07 ENCOUNTER — Ambulatory Visit: Payer: BC Managed Care – PPO | Admitting: Allergy and Immunology

## 2017-09-07 ENCOUNTER — Encounter: Payer: Self-pay | Admitting: Allergy and Immunology

## 2017-09-07 VITALS — BP 118/68 | HR 92 | Resp 16

## 2017-09-07 DIAGNOSIS — J3089 Other allergic rhinitis: Secondary | ICD-10-CM | POA: Diagnosis not present

## 2017-09-07 DIAGNOSIS — J4540 Moderate persistent asthma, uncomplicated: Secondary | ICD-10-CM | POA: Diagnosis not present

## 2017-09-07 DIAGNOSIS — D721 Eosinophilia, unspecified: Secondary | ICD-10-CM

## 2017-09-07 DIAGNOSIS — D72821 Monocytosis (symptomatic): Secondary | ICD-10-CM

## 2017-09-07 DIAGNOSIS — T783XXD Angioneurotic edema, subsequent encounter: Secondary | ICD-10-CM

## 2017-09-07 DIAGNOSIS — T7840XD Allergy, unspecified, subsequent encounter: Secondary | ICD-10-CM

## 2017-09-07 MED ORDER — FLUCONAZOLE 150 MG PO TABS: ORAL_TABLET | ORAL | 0 refills | Status: DC

## 2017-09-07 NOTE — Progress Notes (Signed)
Follow-up Note  Referring Provider: Alycia Rossetti, MD Primary Provider: Susy Frizzle, MD Date of Office Visit: 09/07/2017  Subjective:   Thomas Livingston (DOB: 1950/09/18) is a 67 y.o. male who returns to the [facility] on 09/07/2017 in re-evaluation of the following:  HPI: Thomas Livingston presents to this clinic in reevaluation of his immunological hyperreactivity manifested as recurrent episodes of allergic reaction with angioedema and his significant lung inflammation addressed during his initial evaluation of 01 September 2017.  He has not had any allergic reactions or angioedema.  His breathing is better.  He has not had a significant problem with shortness of breath but he still has a little bit of cough and still make some sputum production but overall is feeling much better.  He does not need to use a short acting bronchodilator.  He has performed some allergen avoidance measures and the dog is now outside of the bedroom and there has been some work regarding control of dust.  He has had no issues with his upper airway.  Allergies as of 09/07/2017      Reactions   Lotrel [amlodipine Besy-benazepril Hcl] Hives, Swelling   Other Swelling   lycra and spandex=hives and swelling   Chalk Other (See Comments)   sneezing      Medication List      albuterol 108 (90 Base) MCG/ACT inhaler Commonly known as:  PROVENTIL HFA;VENTOLIN HFA Inhale 1-2 puffs into the lungs every 6 (six) hours as needed for wheezing or shortness of breath.   aspirin 81 MG tablet Take 81 mg by mouth daily.   budesonide-formoterol 160-4.5 MCG/ACT inhaler Commonly known as:  SYMBICORT Inhale 2 puffs into the lungs 2 (two) times daily.   cetirizine 10 MG tablet Commonly known as:  ZYRTEC Take 1 tablet (10 mg total) by mouth 2 (two) times daily.   famotidine 20 MG tablet Commonly known as:  PEPCID Take 1 tablet (20 mg total) by mouth 2 (two) times daily.   fexofenadine 180 MG  tablet Commonly known as:  ALLEGRA TAKE 1 TABLET BY MOUTH EVERY EVENING   fluticasone 50 MCG/ACT nasal spray Commonly known as:  FLONASE Use 2 sprays in each  nostril daily   montelukast 10 MG tablet Commonly known as:  SINGULAIR Take 1 tablet (10 mg total) by mouth at bedtime.   multivitamin tablet Take 1 tablet by mouth daily.   predniSONE 20 MG tablet Commonly known as:  DELTASONE Take 60mg  x 3 days,40mg  x 3 days, 20mg  x 3 days, 10mg  x 2 days   ranitidine 150 MG capsule Commonly known as:  ZANTAC Take 1 capsule (150 mg total) by mouth 2 (two) times daily.   rosuvastatin 10 MG tablet Commonly known as:  CRESTOR TAKE 1 TABLET BY MOUTH EVERY DAY       Past Medical History:  Diagnosis Date  . Allergy   . Arthritis    left hip  . Hyperlipidemia   . Hypertension 2012   no bp meds since 2012  . Multiple lung nodules on CT 2007   no current follow up done  . Psoriasis    both legs  . Urticaria     Past Surgical History:  Procedure Laterality Date  . CHOLECYSTECTOMY  2007  . EUS N/A 06/28/2014   Procedure: LOWER ENDOSCOPIC ULTRASOUND (EUS);  Surgeon: Milus Banister, MD;  Location: [facility];  Service: Endoscopy;  Laterality: N/A;  . [place]  .  PROCTOSCOPY N/A 08/27/2014   Procedure: PROCTOSCOPY;  Surgeon: Leighton Ruff, MD;  Location: [facility];  Service: General;  Laterality: N/A;  . TRANSANAL RECTAL RESECTION N/A 08/27/2014   Procedure: TRANSANAL RESECTION OF RECTAL TUMOR;  Surgeon: Leighton Ruff, MD;  Location: [facility];  Service: General;  Laterality: N/A;    Review of systems negative except as noted in HPI / PMHx or noted below:  Review of Systems  Constitutional: Negative.   HENT: Negative.   Eyes: Negative.   Respiratory: Negative.   Cardiovascular: Negative.   Gastrointestinal: Negative.   Genitourinary: Negative.   Musculoskeletal: Negative.   Skin: Negative.   Neurological: Negative.   Endo/Heme/Allergies: Negative.    Psychiatric/Behavioral: Negative.      Objective:   Vitals:   09/07/17 1046  BP: 118/68  Pulse: 92  Resp: 16          Physical Exam  HENT:  Head: Normocephalic.  Right Ear: Tympanic membrane, external ear and ear canal normal.  Left Ear: Tympanic membrane, external ear and ear canal normal.  Nose: Nose normal. No mucosal edema or rhinorrhea.  Mouth/Throat: Uvula is midline and mucous membranes are normal. Oropharyngeal exudate (Thrush) present.  Eyes: Conjunctivae are normal.  Neck: Trachea normal. No tracheal tenderness present. No tracheal deviation present. No thyromegaly present.  Cardiovascular: Normal rate, regular rhythm, S1 normal, S2 normal and normal heart sounds.  No murmur heard. Pulmonary/Chest: Breath sounds normal. No stridor. No respiratory distress. He has no wheezes. He has no rales.  Musculoskeletal: He exhibits no edema.  Lymphadenopathy:       Head (right side): No tonsillar adenopathy present.       Head (left side): No tonsillar adenopathy present.    He has no cervical adenopathy.  Neurological: He is alert.  Skin: No rash noted. He is not diaphoretic. No erythema. Nails show no clubbing.    Diagnostics:    Spirometry was performed and demonstrated an FEV1 of 2.73 at 73 % of predicted.   Results of blood tests obtained 01 September 2017 identified WBC 10.6, monocytes 1900, eosinophils 400, lymphocytes 1500, hemoglobin 15.5, platelet 252, T4 1.36 NG/DL, thyroid peroxidase antibody less than 6 IU/mL.  Assessment and Plan:   1. Not well controlled moderate persistent asthma   2. Allergic reaction, subsequent encounter   3. Angioedema, subsequent encounter   4. Other allergic rhinitis   5. Monocytosis   6. Eosinophilia     1.  Continue to perform Allergen avoidance measures   2.  Continue to Treat and prevent reactions:   A.  Cetirizine 10 mg twice a day  B.  Ranitidine 150 mg twice a day  C.  Montelukast 10 mg daily  3.  Continue to Treat  and prevent inflammation:   A.  Symbicort 160 -2 inhalations twice a day with spacer  B.  Flonase 1-2 sprays each nostril daily  4.  Treat Fungal infection:   A.  Diflucan 150 mg tablet today and repeat in 5 days.  5.  If needed:   A.  Pro-air HFA or similar to in relation to every 4-6 hours  B.  OTC Benadryl  C.  Auvi-Q 0.3, Benadryl, MD/ER evaluation for allergic reaction  6. Return to clinic in 4 weeks or earlier if problem  Thomas Livingston appears to be improving regarding his respiratory tract inflammation and I am going to have him continue to use the therapy noted above and as well I have given him fluconazole to address what appears  to be fungal overgrowth of his oral cavity.  He will continue on a large collection of therapy and we will see him back in this clinic in 4 weeks at which point in time if he is doing well we will make an attempt to consolidate his treatment.  He does appear to have monocytosis which apparently was also present a year ago and we may need to have him undergo further evaluation for that issue at some point in the future.  Allena Katz, MD Allergy / [facility] [place]

## 2017-09-07 NOTE — Patient Instructions (Addendum)
  1.  Continue to perform Allergen avoidance measures   2.  Continue to Treat and prevent reactions:   A.  Cetirizine 10 mg twice a day  B.  Ranitidine 150 mg twice a day  C.  Montelukast 10 mg daily  3.  Continue to Treat and prevent inflammation:   A.  Symbicort 160 -2 inhalations twice a day with spacer  B.  Flonase 1-2 sprays each nostril daily  4.  Treat Fungal infection:   A.  Diflucan 150 mg tablet today and repeat in 5 days.  5.  If needed:   A.  Pro-air HFA or similar to in relation to every 4-6 hours  B.  OTC Benadryl  C.  Auvi-Q 0.3, Benadryl, MD/ER evaluation for allergic reaction  6. Return to clinic in 4 weeks or earlier if problem

## 2017-09-08 ENCOUNTER — Encounter: Payer: Self-pay | Admitting: Allergy and Immunology

## 2017-09-24 ENCOUNTER — Telehealth: Payer: Self-pay

## 2017-09-24 MED ORDER — NYSTATIN 100000 UNIT/ML MT SUSP: 5.0000 mL | Freq: Four times a day (QID) | OROMUCOSAL | 0 refills | Status: DC

## 2017-09-24 NOTE — Telephone Encounter (Signed)
I agree with washing his mouth after using his inhaler.  We can send in nystatin suspension 5 mL swish and spit up to 4 times daily until thrush clears.  Typically this takes about 1 week.  Salvatore Marvel, MD [facility]

## 2017-09-24 NOTE — Telephone Encounter (Signed)
Patient is calling due to him getting thrush on his tounge from his inhaler. I informed him that washing his mouth out with each use can help prevent this. Is there anything else he can do after each use. Also he is wondering can a mouth wash or diflucan be sent in to help get rid of the thrush.   Please Advise  [facility]

## 2017-09-24 NOTE — Telephone Encounter (Signed)
Please advise 

## 2017-09-24 NOTE — Telephone Encounter (Signed)
Prescription has been sent in. I called and left a detailed message for the patient advising him of Dr. Gillermina Hu recommendations.

## 2017-10-05 ENCOUNTER — Encounter: Payer: Self-pay | Admitting: Allergy and Immunology

## 2017-10-05 ENCOUNTER — Ambulatory Visit: Payer: BC Managed Care – PPO | Admitting: Allergy and Immunology

## 2017-10-05 VITALS — BP 134/86 | HR 84 | Resp 16

## 2017-10-05 DIAGNOSIS — T783XXD Angioneurotic edema, subsequent encounter: Secondary | ICD-10-CM | POA: Diagnosis not present

## 2017-10-05 DIAGNOSIS — T7840XD Allergy, unspecified, subsequent encounter: Secondary | ICD-10-CM | POA: Diagnosis not present

## 2017-10-05 DIAGNOSIS — J3089 Other allergic rhinitis: Secondary | ICD-10-CM

## 2017-10-05 DIAGNOSIS — J4540 Moderate persistent asthma, uncomplicated: Secondary | ICD-10-CM

## 2017-10-05 DIAGNOSIS — L500 Allergic urticaria: Secondary | ICD-10-CM

## 2017-10-05 NOTE — Patient Instructions (Signed)
  1.  Continue to perform Allergen avoidance measures   2.  Continue to Treat and prevent reactions:   A.  Cetirizine 10 mg twice a day  B.  Ranitidine 150 mg twice a day  C.  Montelukast 10 mg daily  3.  Continue to Treat and prevent inflammation:   A.  Symbicort 160 -2 inhalations twice a day with spacer  B.  Flonase 1-2 sprays each nostril daily  4.  If needed:   A.  Pro-air HFA or similar to in relation to every 4-6 hours  B.  OTC Benadryl  C.  Auvi-Q 0.3, Benadryl, MD/ER evaluation for allergic reaction  6. Return to clinic in 8 weeks or earlier if problem. Taper?

## 2017-10-05 NOTE — Progress Notes (Signed)
Follow-up Note  Referring Provider: Alycia Rossetti, MD Primary Provider: Susy Frizzle, MD Date of Office Visit: 10/05/2017  Subjective:   Thomas Livingston (DOB: 1950/11/10) is a 67 y.o. male who returns to the [facility] on 10/05/2017 in re-evaluation of the following:  HPI: Thomas Livingston returns to this clinic in reevaluation of his recurrent allergic reactions with angioedema and urticaria and asthma.  His last visit to this clinic was 07 September 2017.  His asthma is once again under excellent control and he does not need to use a short acting bronchodilator and he can exert himself without any problem.  He continues to use Symbicort on a regular basis.  He is using a spacer and he is rinsing his mouth after using Symbicort given his previous episode of thrush.  He has had 2 episodes of urticaria since I have seen him in his clinic both responding to a single Benadryl tablet within several hours.  Otherwise, he has not had any signs of immunological hyperreactivity while continuing on a collection of medical therapy including an H1 and H2 receptor blocker and a leukotriene modifier.  Allergies as of 10/05/2017      Reactions   Lotrel [amlodipine Besy-benazepril Hcl] Hives, Swelling   Other Swelling   lycra and spandex=hives and swelling   Chalk Other (See Comments)   sneezing      Medication List      albuterol 108 (90 Base) MCG/ACT inhaler Commonly known as:  PROVENTIL HFA;VENTOLIN HFA Inhale 1-2 puffs into the lungs every 6 (six) hours as needed for wheezing or shortness of breath.   aspirin 81 MG tablet Take 81 mg by mouth daily.   budesonide-formoterol 160-4.5 MCG/ACT inhaler Commonly known as:  SYMBICORT Inhale 2 puffs into the lungs 2 (two) times daily.   cetirizine 10 MG tablet Commonly known as:  ZYRTEC Take 1 tablet (10 mg total) by mouth 2 (two) times daily.   famotidine 20 MG tablet Commonly known as:  PEPCID Take 1 tablet (20 mg total) by  mouth 2 (two) times daily.   fexofenadine 180 MG tablet Commonly known as:  ALLEGRA TAKE 1 TABLET BY MOUTH EVERY EVENING   fluconazole 150 MG tablet Commonly known as:  DIFLUCAN Take one tablet today then repeat in 5 days.   fluticasone 50 MCG/ACT nasal spray Commonly known as:  FLONASE Use 2 sprays in each  nostril daily   montelukast 10 MG tablet Commonly known as:  SINGULAIR Take 1 tablet (10 mg total) by mouth at bedtime.   multivitamin tablet Take 1 tablet by mouth daily.   nystatin 100000 UNIT/ML suspension Commonly known as:  MYCOSTATIN Take 5 mLs (500,000 Units total) by mouth 4 (four) times daily.   predniSONE 20 MG tablet Commonly known as:  DELTASONE Take 60mg  x 3 days,40mg  x 3 days, 20mg  x 3 days, 10mg  x 2 days   ranitidine 150 MG capsule Commonly known as:  ZANTAC Take 1 capsule (150 mg total) by mouth 2 (two) times daily.   rosuvastatin 10 MG tablet Commonly known as:  CRESTOR TAKE 1 TABLET BY MOUTH EVERY DAY       Past Medical History:  Diagnosis Date  . Allergy   . Arthritis    left hip  . Hyperlipidemia   . Hypertension 2012   no bp meds since 2012  . Multiple lung nodules on CT 2007   no current follow up done  . Psoriasis  both legs  . Urticaria     Past Surgical History:  Procedure Laterality Date  . CHOLECYSTECTOMY  2007  . EUS N/A 06/28/2014   Procedure: LOWER ENDOSCOPIC ULTRASOUND (EUS);  Surgeon: Milus Banister, MD;  Location: [facility];  Service: Endoscopy;  Laterality: N/A;  . [place]  . PROCTOSCOPY N/A 08/27/2014   Procedure: PROCTOSCOPY;  Surgeon: Leighton Ruff, MD;  Location: [facility];  Service: General;  Laterality: N/A;  . TRANSANAL RECTAL RESECTION N/A 08/27/2014   Procedure: TRANSANAL RESECTION OF RECTAL TUMOR;  Surgeon: Leighton Ruff, MD;  Location: [facility];  Service: General;  Laterality: N/A;    Review of systems negative except as noted in HPI / PMHx or noted below:  Review of Systems    Constitutional: Negative.   HENT: Negative.   Eyes: Negative.   Respiratory: Negative.   Cardiovascular: Negative.   Gastrointestinal: Negative.   Genitourinary: Negative.   Musculoskeletal: Negative.   Skin: Negative.   Neurological: Negative.   Endo/Heme/Allergies: Negative.   Psychiatric/Behavioral: Negative.      Objective:   Vitals:   10/05/17 1044  BP: 134/86  Pulse: 84  Resp: 16          Physical Exam  HENT:  Head: Normocephalic.  Right Ear: Tympanic membrane, external ear and ear canal normal.  Left Ear: Tympanic membrane, external ear and ear canal normal.  Nose: Nose normal. No mucosal edema or rhinorrhea.  Mouth/Throat: Uvula is midline, oropharynx is clear and moist and mucous membranes are normal. No oropharyngeal exudate.  Eyes: Conjunctivae are normal.  Neck: Trachea normal. No tracheal tenderness present. No tracheal deviation present. No thyromegaly present.  Cardiovascular: Normal rate, regular rhythm, S1 normal, S2 normal and normal heart sounds.  No murmur heard. Pulmonary/Chest: Breath sounds normal. No stridor. No respiratory distress. He has no wheezes. He has no rales.  Musculoskeletal: He exhibits no edema.  Lymphadenopathy:       Head (right side): No tonsillar adenopathy present.       Head (left side): No tonsillar adenopathy present.    He has no cervical adenopathy.  Neurological: He is alert.  Skin: No rash noted. He is not diaphoretic. No erythema. Nails show no clubbing.    Diagnostics:    Spirometry was performed and demonstrated an FEV1 of 2.41 at 64 % of predicted.  Assessment and Plan:   1. Asthma, moderate persistent, well-controlled   2. Other allergic rhinitis   3. Allergic reaction, subsequent encounter   4. Angioedema, subsequent encounter   5. Allergic urticaria      1.  Continue to perform Allergen avoidance measures   2.  Continue to Treat and prevent reactions:   A.  Cetirizine 10 mg twice a day  B.   Ranitidine 150 mg twice a day  C.  Montelukast 10 mg daily  3.  Continue to Treat and prevent inflammation:   A.  Symbicort 160 -2 inhalations twice a day with spacer  B.  Flonase 1-2 sprays each nostril daily  4.  If needed:   A.  Pro-air HFA or similar to in relation to every 4-6 hours  B.  OTC Benadryl  C.  Auvi-Q 0.3, Benadryl, MD/ER evaluation for allergic reaction  6. Return to clinic in 8 weeks or earlier if problem. Taper?   Thomas Livingston continues to do relatively well regarding his pulmonary inflammatory state and his overactive immune system manifested as recurrent allergic reactions with urticaria and angioedema on his current plan.  He will continue to use anti-inflammatory agents for his respiratory track and a large collection of therapy directed against immunological hyperactivity and I will see him back in this clinic in 8 weeks.  Hopefully the process giving rise to this activity will start to burn out and there may be an opportunity to consolidate some of his treatment in the near future.  Allena Katz, MD Allergy / Immunology [place]

## 2017-10-06 ENCOUNTER — Encounter: Payer: Self-pay | Admitting: Allergy and Immunology

## 2017-10-06 ENCOUNTER — Telehealth: Payer: Self-pay | Admitting: *Deleted

## 2017-10-06 NOTE — Telephone Encounter (Signed)
Patient called back advised as written per Dr Neldon Mc patient verbalized understanding

## 2017-10-06 NOTE — Telephone Encounter (Signed)
-----   Message from Jiles Prows, MD sent at 10/06/2017  6:41 AM EDT ----- Please let patient know that I reviewed his blood tests and his thyroid function appears to be normal.

## 2017-10-06 NOTE — Telephone Encounter (Signed)
Left message to return call 

## 2017-10-07 NOTE — Addendum Note (Signed)
Addended by: Lucrezia Starch I on: 10/07/2017 06:43 PM   Modules accepted: Orders

## 2017-11-30 ENCOUNTER — Encounter: Payer: Self-pay | Admitting: Allergy and Immunology

## 2017-11-30 ENCOUNTER — Ambulatory Visit: Payer: BC Managed Care – PPO | Admitting: Allergy and Immunology

## 2017-11-30 VITALS — BP 130/80 | HR 60 | Temp 97.5°F | Resp 20 | Ht 73.2 in | Wt 236.0 lb

## 2017-11-30 DIAGNOSIS — T783XXD Angioneurotic edema, subsequent encounter: Secondary | ICD-10-CM

## 2017-11-30 DIAGNOSIS — L500 Allergic urticaria: Secondary | ICD-10-CM

## 2017-11-30 DIAGNOSIS — J3089 Other allergic rhinitis: Secondary | ICD-10-CM | POA: Diagnosis not present

## 2017-11-30 DIAGNOSIS — T7840XD Allergy, unspecified, subsequent encounter: Secondary | ICD-10-CM | POA: Diagnosis not present

## 2017-11-30 DIAGNOSIS — J4540 Moderate persistent asthma, uncomplicated: Secondary | ICD-10-CM | POA: Diagnosis not present

## 2017-11-30 NOTE — Patient Instructions (Signed)
  1.  Continue to perform Allergen avoidance measures   2.  Continue to Treat and prevent reactions:   A.  DECREASE Cetirizine 10 mg one time per day  B.  DECREASE Ranitidine 150 mg one time per day  C.  Montelukast 10 mg daily  3.  Continue to Treat and prevent inflammation:   A.  Symbicort 160 -2 inhalations twice a day with spacer  B.  Flonase 1-2 sprays each nostril daily  4.  If needed:   A.  Pro-air HFA or similar to in relation to every 4-6 hours  B.  OTC Benadryl  C.  Auvi-Q 0.3, Benadryl, MD/ER evaluation for allergic reaction  6. Return to clinic in 12 weeks or earlier if problem. Taper?   7. Obtain fall flu vaccine

## 2017-11-30 NOTE — Progress Notes (Signed)
Follow-up Note  Referring Provider: Susy Frizzle, MD Primary Provider: Susy Frizzle, MD Date of Office Visit: 11/30/2017  Subjective:   Thomas Livingston (DOB: [birth year]) is a 67 y.o. male who returns to the [facility] on 11/30/2017 in re-evaluation of the following:  HPI: [name] returns to this clinic in evaluation of recurrent allergic reactions with angioedema and urticaria and asthma.  His last visit to this clinic was 05 Oct 2017.  Concerning his asthma he has excellent control of this issue and no longer needs to use a short acting bronchodilator and has minimal respiratory tract symptoms.  He has had very little problems with his upper airways.  He has been consistently using Symbicort and Flonase.  Concerning his urticaria and angioedema and recurrent allergic reactions these have been completely inactive and he has not had any issues requiring him to use additional antihistamines and has not required him to use a injectable epinephrine device.  He has been consistently using cetirizine and ranitidine and montelukast.  Allergies as of 11/30/2017      Reactions   Lotrel [amlodipine Besy-benazepril Hcl] Hives, Swelling   Other Swelling   lycra and spandex=hives and swelling   Chalk Other (See Comments)   sneezing      Medication List      albuterol 108 (90 Base) MCG/ACT inhaler Commonly known as:  PROVENTIL HFA;VENTOLIN HFA Inhale 1-2 puffs into the lungs every 6 (six) hours as needed for wheezing or shortness of breath.   aspirin 81 MG tablet Take 81 mg by mouth daily.   budesonide-formoterol 160-4.5 MCG/ACT inhaler Commonly known as:  SYMBICORT Inhale 2 puffs into the lungs 2 (two) times daily.   cetirizine 10 MG tablet Commonly known as:  ZYRTEC Take 1 tablet (10 mg total) by mouth 2 (two) times daily.   famotidine 20 MG tablet Commonly known as:  PEPCID Take 1 tablet (20 mg total) by mouth 2 (two) times daily.   fexofenadine 180  MG tablet Commonly known as:  ALLEGRA TAKE 1 TABLET BY MOUTH EVERY EVENING   fluconazole 150 MG tablet Commonly known as:  DIFLUCAN Take one tablet today then repeat in 5 days.   fluticasone 50 MCG/ACT nasal spray Commonly known as:  FLONASE Use 2 sprays in each  nostril daily   montelukast 10 MG tablet Commonly known as:  SINGULAIR Take 1 tablet (10 mg total) by mouth at bedtime.   multivitamin tablet Take 1 tablet by mouth daily.   ranitidine 150 MG capsule Commonly known as:  ZANTAC Take 1 capsule (150 mg total) by mouth 2 (two) times daily.   rosuvastatin 10 MG tablet Commonly known as:  CRESTOR TAKE 1 TABLET BY MOUTH EVERY DAY       Past Medical History:  Diagnosis Date  . Allergy   . Arthritis    left hip  . Hyperlipidemia   . Hypertension 2012   no bp meds since 2012  . Multiple lung nodules on CT 2007   no current follow up done  . Psoriasis    both legs  . Urticaria     Past Surgical History:  Procedure Laterality Date  . CHOLECYSTECTOMY  2007  . EUS N/A 06/28/2014   Procedure: LOWER ENDOSCOPIC ULTRASOUND (EUS);  Surgeon: Milus Banister, MD;  Location: [facility];  Service: Endoscopy;  Laterality: N/A;  . [place]  . PROCTOSCOPY N/A 08/27/2014   Procedure: PROCTOSCOPY;  Surgeon: Leighton Ruff, MD;  Location: [facility];  Service: General;  Laterality: N/A;  . TRANSANAL RECTAL RESECTION N/A 08/27/2014   Procedure: TRANSANAL RESECTION OF RECTAL TUMOR;  Surgeon: Leighton Ruff, MD;  Location: [facility];  Service: General;  Laterality: N/A;    Review of systems negative except as noted in HPI / PMHx or noted below:  Review of Systems  Constitutional: Negative.   HENT: Negative.   Eyes: Negative.   Respiratory: Negative.   Cardiovascular: Negative.   Gastrointestinal: Negative.   Genitourinary: Negative.   Musculoskeletal: Negative.   Skin: Negative.   Neurological: Negative.   Endo/Heme/Allergies: Negative.   Psychiatric/Behavioral:  Negative.      Objective:   Vitals:   11/30/17 0921  BP: 130/80  Pulse: 60  Resp: 20  Temp: (!) 97.5 F (36.4 C)  SpO2: 95%   Height: 6' 1.2" (185.9 cm)  Weight: 236 lb (107 kg)   Physical Exam  HENT:  Head: Normocephalic.  Right Ear: Tympanic membrane, external ear and ear canal normal.  Left Ear: Tympanic membrane, external ear and ear canal normal.  Nose: Nose normal. No mucosal edema or rhinorrhea.  Mouth/Throat: Uvula is midline, oropharynx is clear and moist and mucous membranes are normal. No oropharyngeal exudate.  Eyes: Conjunctivae are normal.  Neck: Trachea normal. No tracheal tenderness present. No tracheal deviation present. No thyromegaly present.  Cardiovascular: Normal rate, regular rhythm, S1 normal, S2 normal and normal heart sounds.  No murmur heard. Pulmonary/Chest: Breath sounds normal. No stridor. No respiratory distress. He has no wheezes. He has no rales.  Musculoskeletal: He exhibits no edema.  Lymphadenopathy:       Head (right side): No tonsillar adenopathy present.       Head (left side): No tonsillar adenopathy present.    He has no cervical adenopathy.  Neurological: He is alert.  Skin: No rash noted. He is not diaphoretic. No erythema. Nails show no clubbing.    Diagnostics:    Spirometry was performed and demonstrated an FEV1 of 2.94 at 77 % of predicted.  His previous FEV1 was 2.41  The patient had an Asthma Control Test with the following results: ACT Total Score: 25.    Assessment and Plan:   1. Asthma, moderate persistent, well-controlled   2. Other allergic rhinitis   3. Allergic reaction, subsequent encounter   4. Angioedema, subsequent encounter   5. Allergic urticaria      1.  Continue to perform Allergen avoidance measures   2.  Continue to Treat and prevent reactions:   A.  DECREASE Cetirizine 10 mg one time per day  B.  DECREASE Ranitidine 150 mg one time per day  C.  Montelukast 10 mg daily  3.  Continue to  Treat and prevent inflammation:   A.  Symbicort 160 -2 inhalations twice a day with spacer  B.  Flonase 1-2 sprays each nostril daily  4.  If needed:   A.  Pro-air HFA or similar to in relation to every 4-6 hours  B.  OTC Benadryl  C.  Auvi-Q 0.3, Benadryl, MD/ER evaluation for allergic reaction  6. Return to clinic in 12 weeks or earlier if problem. Taper?   7. Obtain fall flu vaccine  [name] is doing much better regarding his asthma on his current therapy and we will continue to have him use anti-inflammatory agents in the form of Symbicort for this condition and he will also continue to use a nasal steroid for his allergic rhinitis.  His immunological hyperreactivity is also  significantly diminished.  I will make an attempt to consolidate his antihistamine use as noted above.  I will see him back in this clinic in 12 weeks or earlier if there is a problem.  Allena Katz, MD Allergy / Immunology [place]

## 2017-12-01 ENCOUNTER — Encounter: Payer: Self-pay | Admitting: Allergy and Immunology

## 2018-01-04 ENCOUNTER — Other Ambulatory Visit: Payer: Self-pay | Admitting: Family Medicine

## 2018-01-13 ENCOUNTER — Encounter: Payer: Self-pay | Admitting: Family Medicine

## 2018-01-13 ENCOUNTER — Ambulatory Visit: Payer: BC Managed Care – PPO | Admitting: Family Medicine

## 2018-01-13 VITALS — BP 110/72 | HR 72 | Temp 98.1°F | Resp 16 | Ht 74.0 in | Wt 232.0 lb

## 2018-01-13 DIAGNOSIS — J0191 Acute recurrent sinusitis, unspecified: Secondary | ICD-10-CM | POA: Diagnosis not present

## 2018-01-13 MED ORDER — BENZONATATE 100 MG PO CAPS: 200.0000 mg | ORAL_CAPSULE | Freq: Three times a day (TID) | ORAL | 0 refills | Status: DC | PRN

## 2018-01-13 NOTE — Progress Notes (Signed)
Subjective:    Patient ID: Thomas Livingston, male    DOB: 04-26-1951, 67 y.o.   MRN: [id number]  HPI Patient is a 67 year old male who presents today with a 10-day history of cough productive of yellow mucus.  He is currently under the care of an allergist and is taking Zyrtec, Flonase, Singulair all for allergies.  He is on Symbicort and albuterol for asthma.  He states that he has been doing well up until the last 10 days.  Since that time, he reports head congestion, rhinorrhea, postnasal drip, hoarseness, and a cough productive of yellow sputum.  He denies any sinus pain or pressure.  He denies any shortness of breath or chest pain or fevers or chills.  He is primarily here because he is concerned that he may have an infection in his chest. Past Medical History:  Diagnosis Date  . Allergy   . Arthritis    left hip  . Hyperlipidemia   . Hypertension 2012   no bp meds since 2012  . Multiple lung nodules on CT 2007   no current follow up done  . Psoriasis    both legs  . Urticaria    Past Surgical History:  Procedure Laterality Date  . CHOLECYSTECTOMY  2007  . EUS N/A 06/28/2014   Procedure: LOWER ENDOSCOPIC ULTRASOUND (EUS);  Surgeon: Milus Banister, MD;  Location: [facility];  Service: Endoscopy;  Laterality: N/A;  . [place]  . PROCTOSCOPY N/A 08/27/2014   Procedure: PROCTOSCOPY;  Surgeon: Leighton Ruff, MD;  Location: [facility];  Service: General;  Laterality: N/A;  . TRANSANAL RECTAL RESECTION N/A 08/27/2014   Procedure: TRANSANAL RESECTION OF RECTAL TUMOR;  Surgeon: Leighton Ruff, MD;  Location: [facility];  Service: General;  Laterality: N/A;   Current Outpatient Medications on File Prior to Visit  Medication Sig Dispense Refill  . albuterol (PROVENTIL HFA;VENTOLIN HFA) 108 (90 Base) MCG/ACT inhaler Inhale 1-2 puffs into the lungs every 6 (six) hours as needed for wheezing or shortness of breath. 18 g 1  . aspirin 81 MG tablet Take 81 mg by mouth daily.    .  budesonide-formoterol (SYMBICORT) 160-4.5 MCG/ACT inhaler Inhale 2 puffs into the lungs 2 (two) times daily. 10.2 g 5  . cetirizine (ZYRTEC) 10 MG tablet Take 1 tablet (10 mg total) by mouth 2 (two) times daily. (Patient taking differently: Take 10 mg by mouth daily. ) 60 tablet 5  . famotidine (PEPCID) 20 MG tablet Take 1 tablet (20 mg total) by mouth 2 (two) times daily. 30 tablet 0  . fluticasone (FLONASE) 50 MCG/ACT nasal spray Use 2 sprays in each  nostril daily 48 g 3  . montelukast (SINGULAIR) 10 MG tablet Take 1 tablet (10 mg total) by mouth at bedtime. 30 tablet 5  . Multiple Vitamin (MULTIVITAMIN) tablet Take 1 tablet by mouth daily.    . ranitidine (ZANTAC) 150 MG capsule Take 1 capsule (150 mg total) by mouth 2 (two) times daily. (Patient taking differently: Take 150 mg by mouth daily. ) 60 capsule 5  . rosuvastatin (CRESTOR) 10 MG tablet TAKE 1 TABLET BY MOUTH EVERY DAY 90 tablet 3   No current facility-administered medications on file prior to visit.    Allergies  Allergen Reactions  . Lotrel [Amlodipine Besy-Benazepril Hcl] Hives and Swelling  . Other Swelling    lycra and spandex=hives and swelling  . Chalk Other (See Comments)    sneezing   Social History   Socioeconomic  History  . Marital status: Married    Spouse name: Not on file  . Number of children: Not on file  . Years of education: Not on file  . Highest education level: Not on file  Occupational History  . Not on file  Social Needs  . Financial resource strain: Not on file  . Food insecurity:    Worry: Not on file    Inability: Not on file  . Transportation needs:    Medical: Not on file    Non-medical: Not on file  Tobacco Use  . Smoking status: Never Smoker  . Smokeless tobacco: Never Used  Substance and Sexual Activity  . Alcohol use: No  . Drug use: No  . Sexual activity: Not on file  Lifestyle  . Physical activity:    Days per week: Not on file    Minutes per session: Not on file  .  Stress: Not on file  Relationships  . Social connections:    Talks on phone: Not on file    Gets together: Not on file    Attends religious service: Not on file    Active member of club or organization: Not on file    Attends meetings of clubs or organizations: Not on file    Relationship status: Not on file  . Intimate partner violence:    Fear of current or ex partner: Not on file    Emotionally abused: Not on file    Physically abused: Not on file    Forced sexual activity: Not on file  Other Topics Concern  . Not on file  Social History Narrative  . Not on file      Review of Systems  Musculoskeletal: Positive for back pain.  All other systems reviewed and are negative.      Objective:   Physical Exam  Constitutional: He appears well-developed and well-nourished.  HENT:  Right Ear: External ear normal.  Left Ear: External ear normal.  Nose: Mucosal edema and rhinorrhea present. Left sinus exhibits no maxillary sinus tenderness and no frontal sinus tenderness.  Mouth/Throat: Oropharynx is clear and moist. No oropharyngeal exudate.  Neck: Neck supple.  Cardiovascular: Normal rate, regular rhythm and normal heart sounds.  Pulmonary/Chest: Effort normal and breath sounds normal. No respiratory distress. He has no wheezes. He has no rales.  Vitals reviewed.         Assessment & Plan:  Acute recurrent sinusitis, unspecified location  Patient's exam today is unremarkable.  I do not believe the patient has a serious bacterial illness.  I believe the patient is likely dealing with sinusitis either from a virus or possibly from allergies.  I recommended symptomatic relief with Tessalon Perles 200 mg every 8 hours as needed for coughing.  If symptoms persist greater than 1 week, consider treatment for possible sinus infection.  If the patient's cough worsens or he develops shortness of breath, proceed with a chest x-ray.

## 2018-02-01 ENCOUNTER — Telehealth: Payer: Self-pay | Admitting: Allergy and Immunology

## 2018-02-01 ENCOUNTER — Other Ambulatory Visit: Payer: Self-pay | Admitting: *Deleted

## 2018-02-01 MED ORDER — FAMOTIDINE 40 MG PO TABS: 40.0000 mg | ORAL_TABLET | Freq: Every day | ORAL | 1 refills | Status: DC

## 2018-02-01 NOTE — Telephone Encounter (Signed)
Patient would like to quit taking ranitidine and start famotidine would like a [facility] did send in rx

## 2018-02-01 NOTE — Telephone Encounter (Signed)
Ok

## 2018-02-01 NOTE — Telephone Encounter (Signed)
Please inform patient that FDA does not recommend patients discontinuing ranitidine at this point in time.  However, if he feels uncomfortable using ranitidine he can use famotidine 40 mg in place of ranitidine.

## 2018-02-01 NOTE — Telephone Encounter (Signed)
Patient called and said he was on ranitidine. He saw a news report on this and how it has carcinogens in it. He does not want to take it anymore and would like to know if there is an alternative. He would also like to speak to someone on this subject.

## 2018-02-01 NOTE — Telephone Encounter (Signed)
Dr Kozlow please advise 

## 2018-02-19 ENCOUNTER — Other Ambulatory Visit: Payer: Self-pay | Admitting: Allergy and Immunology

## 2018-02-25 ENCOUNTER — Other Ambulatory Visit: Payer: Self-pay | Admitting: Allergy and Immunology

## 2018-02-26 ENCOUNTER — Other Ambulatory Visit: Payer: Self-pay | Admitting: Allergy and Immunology

## 2018-02-28 ENCOUNTER — Telehealth: Payer: Self-pay | Admitting: Family Medicine

## 2018-02-28 NOTE — Telephone Encounter (Signed)
Pt calling to see if you will call in the antibx for the rash he gets on his penis?

## 2018-02-28 NOTE — Telephone Encounter (Signed)
Cipro 500 mg p.o. twice daily for 10 days

## 2018-03-01 ENCOUNTER — Encounter: Payer: Self-pay | Admitting: Allergy and Immunology

## 2018-03-01 ENCOUNTER — Ambulatory Visit: Payer: BC Managed Care – PPO | Admitting: Allergy and Immunology

## 2018-03-01 VITALS — BP 128/74 | HR 63 | Resp 16

## 2018-03-01 DIAGNOSIS — J4540 Moderate persistent asthma, uncomplicated: Secondary | ICD-10-CM | POA: Diagnosis not present

## 2018-03-01 DIAGNOSIS — T7840XD Allergy, unspecified, subsequent encounter: Secondary | ICD-10-CM | POA: Diagnosis not present

## 2018-03-01 DIAGNOSIS — J3089 Other allergic rhinitis: Secondary | ICD-10-CM

## 2018-03-01 MED ORDER — CIPROFLOXACIN HCL 500 MG PO TABS: 500.0000 mg | ORAL_TABLET | Freq: Two times a day (BID) | ORAL | 0 refills | Status: AC

## 2018-03-01 NOTE — Telephone Encounter (Signed)
Pt aware and med sent to pharm 

## 2018-03-01 NOTE — Patient Instructions (Addendum)
  1.  Continue to perform Allergen avoidance measures   2.  Continue to Treat and prevent inflammation:   A.  Symbicort 160 -2 inhalations twice a day with spacer  B.  Flonase 1-2 sprays each nostril daily  C.  Montelukast 10 mg daily  3.  If needed:   A.  Pro-air HFA or similar to in relation to every 4-6 hours  B.  OTC Benadryl  C.  Cetirizine 10 mg daily  D.  Famotidine 40mg  daily  D.  Auvi-Q 0.3, Benadryl, MD/ER evaluation for allergic reaction  4. Return to clinic in February 2020 or earlier if problem

## 2018-03-01 NOTE — Progress Notes (Signed)
Follow-up Note  Referring Provider: Susy Frizzle, MD Primary Provider: Susy Frizzle, MD Date of Office Visit: 03/01/2018  Subjective:   Thomas Livingston (DOB: 12-09-1950) is a 67 y.o. male who returns to the [facility] [facility] on 03/01/2018 in re-evaluation of the following:  HPI: Thomas Livingston returns to this clinic in reevaluation of asthma, allergic rhinitis, and history of recurrent allergic reactions with urticaria and angioedema.  His last visit to this clinic was 30 November 2017 at which point in time we attempted to consolidate some of his treatment by decreasing his cetirizine and ranitidine 50%.  He has done relatively well with his respiratory track.  He did contract a viral upper respiratory tract infection that did not require any antibiotics or systemic steroids for resolution.  Otherwise, his breathing has been very good and he can exercise without any problem and does not use a short acting bronchodilator.  He rarely has any problems with his upper airways.  He continues to use Symbicort and Flonase.  He has not had any allergic reactions.  He continues on a combination of cetirizine and famotidine and montelukast 1 time per day  Allergies as of 03/01/2018      Reactions   Lotrel [amlodipine Besy-benazepril Hcl] Hives, Swelling   Other Swelling   lycra and spandex=hives and swelling   Chalk Other (See Comments)   sneezing      Medication List      albuterol 108 (90 Base) MCG/ACT inhaler Commonly known as:  PROVENTIL HFA;VENTOLIN HFA Inhale 1-2 puffs into the lungs every 6 (six) hours as needed for wheezing or shortness of breath.   aspirin 81 MG tablet Take 81 mg by mouth daily.   benzonatate 100 MG capsule Commonly known as:  TESSALON Take 2 capsules (200 mg total) by mouth 3 (three) times daily as needed for cough.   cetirizine 10 MG tablet Commonly known as:  ZYRTEC Take 1 tablet (10 mg total) by mouth 2 (two) times daily.   famotidine 40 MG  tablet Commonly known as:  PEPCID TAKE 1 TABLET BY MOUTH EVERY DAY   fluticasone 50 MCG/ACT nasal spray Commonly known as:  FLONASE Use 2 sprays in each  nostril daily   montelukast 10 MG tablet Commonly known as:  SINGULAIR TAKE 1 TABLET BY MOUTH EVERYDAY AT BEDTIME   multivitamin tablet Take 1 tablet by mouth daily.   rosuvastatin 10 MG tablet Commonly known as:  CRESTOR TAKE 1 TABLET BY MOUTH EVERY DAY   SYMBICORT 160-4.5 MCG/ACT inhaler Generic drug:  budesonide-formoterol TAKE 2 PUFFS BY MOUTH TWICE A DAY       Past Medical History:  Diagnosis Date  . Allergy   . Arthritis    left hip  . Asthma   . Hyperlipidemia   . Hypertension 2012   no bp meds since 2012  . Multiple lung nodules on CT 2007   no current follow up done  . Psoriasis    both legs  . Urticaria     Past Surgical History:  Procedure Laterality Date  . CHOLECYSTECTOMY  2007  . EUS N/A 06/28/2014   Procedure: LOWER ENDOSCOPIC ULTRASOUND (EUS);  Surgeon: Milus Banister, MD;  Location: [facility];  Service: Endoscopy;  Laterality: N/A;  . [place]  . PROCTOSCOPY N/A 08/27/2014   Procedure: PROCTOSCOPY;  Surgeon: Leighton Ruff, MD;  Location: [facility];  Service: General;  Laterality: N/A;  . TRANSANAL RECTAL RESECTION N/A 08/27/2014  Procedure: TRANSANAL RESECTION OF RECTAL TUMOR;  Surgeon: Leighton Ruff, MD;  Location: [facility];  Service: General;  Laterality: N/A;    Review of systems negative except as noted in HPI / PMHx or noted below:  Review of Systems  Constitutional: Negative.   HENT: Negative.   Eyes: Negative.   Respiratory: Negative.   Cardiovascular: Negative.   Gastrointestinal: Negative.   Genitourinary: Negative.   Musculoskeletal: Negative.   Skin: Negative.   Neurological: Negative.   Endo/Heme/Allergies: Negative.   Psychiatric/Behavioral: Negative.      Objective:   Vitals:   03/01/18 0933  BP: 128/74  Pulse: 63  Resp: 16  SpO2: 95%           Physical Exam  HENT:  Head: Normocephalic.  Right Ear: Tympanic membrane, external ear and ear canal normal.  Left Ear: Tympanic membrane, external ear and ear canal normal.  Nose: Nose normal. No mucosal edema or rhinorrhea.  Mouth/Throat: Uvula is midline, oropharynx is clear and moist and mucous membranes are normal. No oropharyngeal exudate.  Eyes: Conjunctivae are normal.  Neck: Trachea normal. No tracheal tenderness present. No tracheal deviation present. No thyromegaly present.  Cardiovascular: Normal rate, regular rhythm, S1 normal, S2 normal and normal heart sounds.  No murmur heard. Pulmonary/Chest: Breath sounds normal. No stridor. No respiratory distress. He has no wheezes. He has no rales.  Musculoskeletal: He exhibits no edema.  Lymphadenopathy:       Head (right side): No tonsillar adenopathy present.       Head (left side): No tonsillar adenopathy present.    He has no cervical adenopathy.  Neurological: He is alert.  Skin: No rash noted. He is not diaphoretic. No erythema. Nails show no clubbing.    Diagnostics:    Spirometry was performed and demonstrated an FEV1 of 3.06 at 81 % of predicted.  The patient had an Asthma Control Test with the following results: ACT Total Score: 24.    Assessment and Plan:   1. Asthma, moderate persistent, well-controlled   2. Other allergic rhinitis   3. Allergic reaction, subsequent encounter      1.  Continue to perform Allergen avoidance measures   2.  Continue to Treat and prevent inflammation:   A.  Symbicort 160 -2 inhalations twice a day with spacer  B.  Flonase 1-2 sprays each nostril daily  C.  Montelukast 10 mg daily  3.  If needed:   A.  Pro-air HFA or similar to in relation to every 4-6 hours  B.  OTC Benadryl  C.  Cetirizine 10 mg daily  D.  Famotidine 40mg  daily  D.  Auvi-Q 0.3, Benadryl, MD/ER evaluation for allergic reaction  4. Return to clinic in February 2020 or earlier if problem  Thomas Livingston is  really doing very well on his current therapy and we will now see if we can further consolidate his treatment by eliminating consistent use of an H1 and H2 receptor blocker.  He can now utilize these agents as needed.  He will continue to use anti-inflammatory agents for his upper and lower airway as specified above.  I will see him back in this clinic in approximately 6 months or earlier if there is a problem.  Allena Katz, MD Allergy / Immunology [place]

## 2018-03-02 ENCOUNTER — Encounter: Payer: Self-pay | Admitting: Allergy and Immunology

## 2018-03-17 ENCOUNTER — Encounter: Payer: Self-pay | Admitting: Family Medicine

## 2018-03-17 ENCOUNTER — Ambulatory Visit (INDEPENDENT_AMBULATORY_CARE_PROVIDER_SITE_OTHER): Payer: BC Managed Care – PPO | Admitting: Family Medicine

## 2018-03-17 VITALS — BP 122/70 | HR 54 | Temp 97.7°F | Resp 16 | Ht 74.0 in | Wt 231.0 lb

## 2018-03-17 DIAGNOSIS — E7800 Pure hypercholesterolemia, unspecified: Secondary | ICD-10-CM | POA: Diagnosis not present

## 2018-03-17 DIAGNOSIS — Z0000 Encounter for general adult medical examination without abnormal findings: Secondary | ICD-10-CM | POA: Diagnosis not present

## 2018-03-17 DIAGNOSIS — Z125 Encounter for screening for malignant neoplasm of prostate: Secondary | ICD-10-CM

## 2018-03-17 DIAGNOSIS — Z114 Encounter for screening for human immunodeficiency virus [HIV]: Secondary | ICD-10-CM | POA: Diagnosis not present

## 2018-03-17 DIAGNOSIS — Z23 Encounter for immunization: Secondary | ICD-10-CM | POA: Diagnosis not present

## 2018-03-17 NOTE — Addendum Note (Signed)
Addended by: Shary Decamp B on: 03/17/2018 03:11 PM   Modules accepted: Orders

## 2018-03-17 NOTE — Progress Notes (Signed)
Subjective:    Patient ID: Thomas Livingston, male    DOB: 10/21/1950, 67 y.o.   MRN: [id number]  HPI Patient is a very pleasant 67 year old white male who presents today for complete physical exam.  He has no concerns.  Immunizations are up-to-date aside from Shingrix. colonoscopy was in 2016.  He is due for a prostate exam today.  Hepatitis C screening is up-to-date.  He consents to HIV screening.  Otherwise he is doing well with no concerns.  Denies falls or depression or memory loss.   Past Medical History:  Diagnosis Date  . Allergy   . Arthritis    left hip  . Asthma   . Hyperlipidemia   . Hypertension 2012   no bp meds since 2012  . Multiple lung nodules on CT 2007   no current follow up done  . Psoriasis    both legs  . Urticaria    Past Surgical History:  Procedure Laterality Date  . CHOLECYSTECTOMY  2007  . EUS N/A 06/28/2014   Procedure: LOWER ENDOSCOPIC ULTRASOUND (EUS);  Surgeon: Milus Banister, MD;  Location: [facility];  Service: Endoscopy;  Laterality: N/A;  . [place]  . PROCTOSCOPY N/A 08/27/2014   Procedure: PROCTOSCOPY;  Surgeon: Leighton Ruff, MD;  Location: [facility];  Service: General;  Laterality: N/A;  . TRANSANAL RECTAL RESECTION N/A 08/27/2014   Procedure: TRANSANAL RESECTION OF RECTAL TUMOR;  Surgeon: Leighton Ruff, MD;  Location: [facility];  Service: General;  Laterality: N/A;   Current Outpatient Medications on File Prior to Visit  Medication Sig Dispense Refill  . albuterol (PROVENTIL HFA;VENTOLIN HFA) 108 (90 Base) MCG/ACT inhaler Inhale 1-2 puffs into the lungs every 6 (six) hours as needed for wheezing or shortness of breath. 18 g 1  . aspirin 81 MG tablet Take 81 mg by mouth daily.    . benzonatate (TESSALON PERLES) 100 MG capsule Take 2 capsules (200 mg total) by mouth 3 (three) times daily as needed for cough. 30 capsule 0  . fluticasone (FLONASE) 50 MCG/ACT nasal spray Use 2 sprays in each  nostril daily 48 g 3  . montelukast  (SINGULAIR) 10 MG tablet TAKE 1 TABLET BY MOUTH EVERYDAY AT BEDTIME 90 tablet 0  . Multiple Vitamin (MULTIVITAMIN) tablet Take 1 tablet by mouth daily.    . rosuvastatin (CRESTOR) 10 MG tablet TAKE 1 TABLET BY MOUTH EVERY DAY 90 tablet 3  . SYMBICORT 160-4.5 MCG/ACT inhaler TAKE 2 PUFFS BY MOUTH TWICE A DAY 30.6 Inhaler 0   No current facility-administered medications on file prior to visit.    Allergies  Allergen Reactions  . Lotrel [Amlodipine Besy-Benazepril Hcl] Hives and Swelling  . Other Swelling    lycra and spandex=hives and swelling  . Chalk Other (See Comments)    sneezing   Social History   Socioeconomic History  . Marital status: Married    Spouse name: Not on file  . Number of children: Not on file  . Years of education: Not on file  . Highest education level: Not on file  Occupational History  . Not on file  Social Needs  . Financial resource strain: Not on file  . Food insecurity:    Worry: Not on file    Inability: Not on file  . Transportation needs:    Medical: Not on file    Non-medical: Not on file  Tobacco Use  . Smoking status: Never Smoker  . Smokeless tobacco: Never Used  Substance and Sexual Activity  . Alcohol use: No  . Drug use: No  . Sexual activity: Not on file  Lifestyle  . Physical activity:    Days per week: Not on file    Minutes per session: Not on file  . Stress: Not on file  Relationships  . Social connections:    Talks on phone: Not on file    Gets together: Not on file    Attends religious service: Not on file    Active member of club or organization: Not on file    Attends meetings of clubs or organizations: Not on file    Relationship status: Not on file  . Intimate partner violence:    Fear of current or ex partner: Not on file    Emotionally abused: Not on file    Physically abused: Not on file    Forced sexual activity: Not on file  Other Topics Concern  . Not on file  Social History Narrative  . Not on file    Family History  Problem Relation Age of Onset  . Thyroid cancer Mother   . Lung cancer Father   . Colon cancer Neg Hx       Review of Systems  All other systems reviewed and are negative.      Objective:   Physical Exam  Constitutional: He is oriented to person, place, and time. He appears well-developed and well-nourished. No distress.  HENT:  Head: Normocephalic and atraumatic.  Right Ear: External ear normal.  Left Ear: External ear normal.  Nose: Nose normal.  Mouth/Throat: Oropharynx is clear and moist. No oropharyngeal exudate.  Eyes: Pupils are equal, round, and reactive to light. Conjunctivae and EOM are normal. Right eye exhibits no discharge. Left eye exhibits no discharge. No scleral icterus.  Neck: Normal range of motion. Neck supple. No JVD present. No tracheal deviation present. No thyromegaly present.  Cardiovascular: Normal rate, regular rhythm, normal heart sounds and intact distal pulses. Exam reveals no gallop and no friction rub.  No murmur heard. Pulmonary/Chest: Effort normal and breath sounds normal. No stridor. No respiratory distress. He has no wheezes. He has no rales. He exhibits no tenderness.  Abdominal: Soft. Bowel sounds are normal. He exhibits no distension and no mass. There is no tenderness. There is no rebound and no guarding.  Genitourinary: Rectum normal and prostate normal.  Musculoskeletal: Normal range of motion. He exhibits no edema, tenderness or deformity.  Lymphadenopathy:    He has no cervical adenopathy.  Neurological: He is alert and oriented to person, place, and time. He has normal reflexes. No cranial nerve deficit. He exhibits normal muscle tone. Coordination normal.  Skin: Skin is warm. No rash noted. He is not diaphoretic. No erythema. No pallor.  Psychiatric: He has a normal mood and affect. His behavior is normal. Judgment and thought content normal.  Vitals reviewed.         Assessment & Plan:  Pure  hypercholesterolemia - Plan: CBC with Differential/Platelet, COMPLETE METABOLIC PANEL WITH GFR, Lipid panel  Prostate cancer screening - Plan: PSA  General medical exam - Plan: CBC with Differential/Platelet, COMPLETE METABOLIC PANEL WITH GFR, Lipid panel, PSA  Physical exam today is completely normal.  I will screen the patient for prostate cancer with a PSA.  His blood pressure is excellent.  I will check a CBC, CMP, fasting lipid panel to screen for hyperlipidemia.  Colonoscopy is up-to-date.  Also exam is normal.  Immunizations are up-to-date.  I  did recommend the shingles vaccine.  Otherwise his preventative care is up-to-date.  He received prevnar 13 today. Will get pneumovax next year.  Screen for HIV.

## 2018-03-18 LAB — COMPLETE METABOLIC PANEL WITH GFR
AG Ratio: 1.7 (calc) (ref 1.0–2.5)
ALT: 22 U/L (ref 9–46)
AST: 24 U/L (ref 10–35)
Albumin: 4.1 g/dL (ref 3.6–5.1)
Alkaline phosphatase (APISO): 89 U/L (ref 40–115)
BUN: 11 mg/dL (ref 7–25)
CO2: 28 mmol/L (ref 20–32)
Calcium: 9.3 mg/dL (ref 8.6–10.3)
Chloride: 102 mmol/L (ref 98–110)
Creat: 0.81 mg/dL (ref 0.70–1.25)
GFR, Est African American: 108 mL/min/{1.73_m2} (ref >=60)
GFR, Est Non African American: 93 mL/min/{1.73_m2} (ref >=60)
Globulin: 2.4 g/dL (calc) (ref 1.9–3.7)
Glucose, Bld: 89 mg/dL (ref 65–99)
Potassium: 4.7 mmol/L (ref 3.5–5.3)
Sodium: 137 mmol/L (ref 135–146)
Total Bilirubin: 0.8 mg/dL (ref 0.2–1.2)
Total Protein: 6.5 g/dL (ref 6.1–8.1)

## 2018-03-18 LAB — CBC WITH DIFFERENTIAL/PLATELET
Basophils Absolute: 60 cells/uL (ref 0–200)
Basophils Relative: 0.7 %
Eosinophils Absolute: 421 cells/uL (ref 15–500)
Eosinophils Relative: 4.9 %
HCT: 45.2 % (ref 38.5–50.0)
Hemoglobin: 15.2 g/dL (ref 13.2–17.1)
Lymphs Abs: 955 cells/uL (ref 850–3900)
MCH: 30.7 pg (ref 27.0–33.0)
MCHC: 33.6 g/dL (ref 32.0–36.0)
MCV: 91.3 fL (ref 80.0–100.0)
MPV: 10.9 fL (ref 7.5–12.5)
Monocytes Relative: 10.1 %
Neutro Abs: 6295 cells/uL (ref 1500–7800)
Neutrophils Relative %: 73.2 %
Platelets: 281 10*3/uL (ref 140–400)
RBC: 4.95 10*6/uL (ref 4.20–5.80)
RDW: 12.4 % (ref 11.0–15.0)
Total Lymphocyte: 11.1 %
WBC mixed population: 869 cells/uL (ref 200–950)
WBC: 8.6 10*3/uL (ref 3.8–10.8)

## 2018-03-18 LAB — PSA: PSA: 0.7 ng/mL (ref <=4.0)

## 2018-03-18 LAB — HIV ANTIBODY (ROUTINE TESTING W REFLEX): HIV 1&2 Ab, 4th Generation: NONREACTIVE

## 2018-03-18 LAB — LIPID PANEL
Cholesterol: 139 mg/dL (ref <200)
HDL: 40 mg/dL — ABNORMAL LOW (ref >40)
LDL Cholesterol (Calc): 83 mg/dL (calc)
Non-HDL Cholesterol (Calc): 99 mg/dL (calc) (ref <130)
Total CHOL/HDL Ratio: 3.5 (calc) (ref <5.0)
Triglycerides: 79 mg/dL (ref <150)

## 2018-03-19 ENCOUNTER — Emergency Department (HOSPITAL_COMMUNITY): Payer: BC Managed Care – PPO

## 2018-03-19 ENCOUNTER — Emergency Department (HOSPITAL_COMMUNITY)
Admission: EM | Admit: 2018-03-19 | Discharge: 2018-03-19 | Disposition: A | Discharge status: Home / Self Care | Payer: BC Managed Care – PPO | Attending: Emergency Medicine | Admitting: Emergency Medicine

## 2018-03-19 ENCOUNTER — Encounter (HOSPITAL_COMMUNITY): Payer: Self-pay | Admitting: *Deleted

## 2018-03-19 DIAGNOSIS — I10 Essential (primary) hypertension: Secondary | ICD-10-CM | POA: Diagnosis not present

## 2018-03-19 DIAGNOSIS — N132 Hydronephrosis with renal and ureteral calculous obstruction: Secondary | ICD-10-CM | POA: Insufficient documentation

## 2018-03-19 DIAGNOSIS — R110 Nausea: Secondary | ICD-10-CM | POA: Diagnosis not present

## 2018-03-19 DIAGNOSIS — N201 Calculus of ureter: Secondary | ICD-10-CM | POA: Insufficient documentation

## 2018-03-19 DIAGNOSIS — Z7982 Long term (current) use of aspirin: Secondary | ICD-10-CM | POA: Insufficient documentation

## 2018-03-19 DIAGNOSIS — Z79899 Other long term (current) drug therapy: Secondary | ICD-10-CM | POA: Diagnosis not present

## 2018-03-19 DIAGNOSIS — R1084 Generalized abdominal pain: Secondary | ICD-10-CM | POA: Diagnosis present

## 2018-03-19 DIAGNOSIS — N200 Calculus of kidney: Secondary | ICD-10-CM

## 2018-03-19 LAB — CBC
HCT: 43.1 % (ref 39.0–52.0)
Hemoglobin: 14.3 g/dL (ref 13.0–17.0)
MCH: 31.1 pg (ref 26.0–34.0)
MCHC: 33.2 g/dL (ref 30.0–36.0)
MCV: 93.7 fL (ref 80.0–100.0)
Platelets: 260 10*3/uL (ref 150–400)
RBC: 4.6 MIL/uL (ref 4.22–5.81)
RDW: 12.7 % (ref 11.5–15.5)
WBC: 14.8 10*3/uL — ABNORMAL HIGH (ref 4.0–10.5)
nRBC: 0 % (ref 0.0–0.2)

## 2018-03-19 LAB — COMPREHENSIVE METABOLIC PANEL
ALT: 20 U/L (ref 0–44)
AST: 24 U/L (ref 15–41)
Albumin: 3.9 g/dL (ref 3.5–5.0)
Alkaline Phosphatase: 82 U/L (ref 38–126)
Anion gap: 8 (ref 5–15)
BUN: 12 mg/dL (ref 8–23)
CO2: 29 mmol/L (ref 22–32)
Calcium: 8.7 mg/dL — ABNORMAL LOW (ref 8.9–10.3)
Chloride: 95 mmol/L — ABNORMAL LOW (ref 98–111)
Creatinine, Ser: 1.22 mg/dL (ref 0.61–1.24)
GFR calc Af Amer: >60 mL/min (ref >60)
GFR calc non Af Amer: >60 mL/min (ref >60)
Glucose, Bld: 117 mg/dL — ABNORMAL HIGH (ref 70–99)
Potassium: 4.5 mmol/L (ref 3.5–5.1)
Sodium: 132 mmol/L — ABNORMAL LOW (ref 135–145)
Total Bilirubin: 1.2 mg/dL (ref 0.3–1.2)
Total Protein: 6.7 g/dL (ref 6.5–8.1)

## 2018-03-19 LAB — URINALYSIS, ROUTINE W REFLEX MICROSCOPIC
Bilirubin Urine: NEGATIVE
Glucose, UA: NEGATIVE mg/dL
Ketones, ur: 20 mg/dL — AB
Leukocytes, UA: NEGATIVE
Nitrite: NEGATIVE
Protein, ur: NEGATIVE mg/dL
Specific Gravity, Urine: 1.012 (ref 1.005–1.030)
pH: 6 (ref 5.0–8.0)

## 2018-03-19 LAB — LIPASE, BLOOD: Lipase: 37 U/L (ref 11–51)

## 2018-03-19 MED ORDER — SODIUM CHLORIDE (PF) 0.9 % IJ SOLN
INTRAMUSCULAR | Status: AC
  Filled 2018-03-19: qty 50

## 2018-03-19 MED ORDER — HYDROCODONE-ACETAMINOPHEN 5-325 MG PO TABS: 1.0000 | ORAL_TABLET | Freq: Four times a day (QID) | ORAL | 0 refills | Status: AC | PRN

## 2018-03-19 MED ORDER — IOPAMIDOL (ISOVUE-300) INJECTION 61%
INTRAVENOUS | Status: AC
  Filled 2018-03-19: qty 100

## 2018-03-19 MED ORDER — MORPHINE SULFATE (PF) 4 MG/ML IV SOLN
4.0000 mg | Freq: Once | INTRAVENOUS | Status: AC
  Administered 2018-03-19: 4 mg via INTRAVENOUS
  Filled 2018-03-19: qty 1

## 2018-03-19 MED ORDER — SODIUM CHLORIDE 0.9 % IV BOLUS
1000.0000 mL | Freq: Once | INTRAVENOUS | Status: AC
  Administered 2018-03-19: 1000 mL via INTRAVENOUS

## 2018-03-19 MED ORDER — KETOROLAC TROMETHAMINE 15 MG/ML IJ SOLN
15.0000 mg | Freq: Once | INTRAMUSCULAR | Status: AC
  Administered 2018-03-19: 15 mg via INTRAVENOUS
  Filled 2018-03-19: qty 1

## 2018-03-19 MED ORDER — IOPAMIDOL (ISOVUE-M 300) INJECTION 61%: 15.0000 mL | Freq: Once | INTRAMUSCULAR | Status: DC | PRN

## 2018-03-19 MED ORDER — TAMSULOSIN HCL 0.4 MG PO CAPS: 0.4000 mg | ORAL_CAPSULE | Freq: Every day | ORAL | 0 refills | Status: DC

## 2018-03-19 MED ORDER — ONDANSETRON HCL 4 MG/2ML IJ SOLN
4.0000 mg | Freq: Once | INTRAMUSCULAR | Status: AC
  Administered 2018-03-19: 4 mg via INTRAVENOUS
  Filled 2018-03-19: qty 2

## 2018-03-19 MED ORDER — IOPAMIDOL (ISOVUE-300) INJECTION 61%
100.0000 mL | Freq: Once | INTRAVENOUS | Status: AC | PRN
  Administered 2018-03-19: 100 mL via INTRAVENOUS

## 2018-03-19 NOTE — ED Provider Notes (Signed)
Medical screening examination/treatment/procedure(s) were conducted as a shared visit with non-physician practitioner(s) and myself.  I personally evaluated the patient during the encounter. Briefly, the patient is a 67 yo well appearing male here with abd pain, found to have 5 mm stone on right, possible additional recently passed stone. UA without UTI. Renal function at baseline. He is AF, well appearing without sigsn of superinfection. Pain is controlled. D/c with outpt Urology f/u, fluids, flomax, pain meds..   EKG Interpretation None          Duffy Bruce, MD 03/19/18 1800

## 2018-03-19 NOTE — Discharge Instructions (Addendum)
Were evaluated today for abdominal pain.  Your CT scan did show that you have a kidney stone.  I have prescribed you to medicines.  One medicine is called Flomax, this will help to dilate the ureters.  Please be careful when you go from laying down to standing as this may cause some low blood pressure.  I would recommend sitting on the edge of the bed and dangling her feet for couple minutes before he start to move.  I have also prescribed you a medicine called Norco.  This is a pain medicine.  Please take as prescribed.  I have referred you to urologist.  Please follow-up on Monday with them.  Return to the ED for any new or worsening symptoms

## 2018-03-19 NOTE — ED Provider Notes (Signed)
[facility] DEPT Provider Note   CSN: [id number] Arrival date & time: 03/19/18  1357     History   Chief Complaint Chief Complaint  Patient presents with  . Abdominal Pain    HPI Thomas Livingston is a 67 y.o. male with past medical history significant for hypertension, arthritis, asthma, hyperlipidemia who presents for evaluation of abdominal pain and constipation.  Pain started last night.  Pain is rated a 5/10, 7/10 at its worst. Pain is located to the generalized abdomen.  Denies radiation of pain.  Describes his pain as an aching sensation.  Patient has tried milk of magnesia which he states helped last night.  Denies history of bowel obstructions, diverticulitis, kidney stones.  Admits to prior abdominal surgery for gangrenous cholecystitis in 2016.  Admits to nausea without emesis.  Denies fever, chills, chest pain, shortness of breath, radiation of abdominal pain to his back, dysuria, diarrhea.  Patient states his last bowel movement was yesterday evening at approximately 11 PM.  Denies melena or bright red blood per rectum.  Patient states "I do not feel like my bowels are regulated at this time."  History obtained from patient and significant other.  No interpreter was used.  HPI  Past Medical History:  Diagnosis Date  . Allergy   . Arthritis    left hip  . Asthma   . Hyperlipidemia   . Hypertension 2012   no bp meds since 2012  . Multiple lung nodules on CT 2007   no current follow up done  . Psoriasis    both legs  . Urticaria     Patient Active Problem List   Diagnosis Date Noted  . Rectal mass 08/27/2014  . Hypertension   . Hyperlipidemia     Past Surgical History:  Procedure Laterality Date  . CHOLECYSTECTOMY  2007  . EUS N/A 06/28/2014   Procedure: LOWER ENDOSCOPIC ULTRASOUND (EUS);  Surgeon: Milus Banister, MD;  Location: [facility];  Service: Endoscopy;  Laterality: N/A;  . [place]  . PROCTOSCOPY N/A  08/27/2014   Procedure: PROCTOSCOPY;  Surgeon: Leighton Ruff, MD;  Location: [facility];  Service: General;  Laterality: N/A;  . TRANSANAL RECTAL RESECTION N/A 08/27/2014   Procedure: TRANSANAL RESECTION OF RECTAL TUMOR;  Surgeon: Leighton Ruff, MD;  Location: [facility];  Service: General;  Laterality: N/A;        Home Medications    Prior to Admission medications   Medication Sig Start Date End Date Taking? Authorizing Provider  albuterol (PROVENTIL HFA;VENTOLIN HFA) 108 (90 Base) MCG/ACT inhaler Inhale 1-2 puffs into the lungs every 6 (six) hours as needed for wheezing or shortness of breath. 09/01/17   Kozlow, Donnamarie Poag, MD  aspirin 81 MG tablet Take 81 mg by mouth daily.    [provider]  benzonatate (TESSALON PERLES) 100 MG capsule Take 2 capsules (200 mg total) by mouth 3 (three) times daily as needed for cough. 01/13/18   Susy Frizzle, MD  fluticasone Asencion Islam) 50 MCG/ACT nasal spray Use 2 sprays in each  nostril daily 01/10/14   Susy Frizzle, MD  HYDROcodone-acetaminophen (NORCO/VICODIN) 5-325 MG tablet Take 1-2 tablets by mouth every 6 (six) hours as needed for up to 3 days. 03/19/18 03/22/18  Laportia Carley A, PA-C  montelukast (SINGULAIR) 10 MG tablet TAKE 1 TABLET BY MOUTH EVERYDAY AT BEDTIME 02/21/18   Kozlow, Donnamarie Poag, MD  Multiple Vitamin (MULTIVITAMIN) tablet Take 1 tablet by mouth daily.  [provider]  rosuvastatin (CRESTOR) 10 MG tablet TAKE 1 TABLET BY MOUTH EVERY DAY 01/04/18   Susy Frizzle, MD  SYMBICORT 160-4.5 MCG/ACT inhaler TAKE 2 PUFFS BY MOUTH TWICE A DAY 02/28/18   Kozlow, Donnamarie Poag, MD  tamsulosin (FLOMAX) 0.4 MG CAPS capsule Take 1 capsule (0.4 mg total) by mouth daily. 03/19/18   Calianna Kim A, PA-C    Family History Family History  Problem Relation Age of Onset  . Thyroid cancer Mother   . Lung cancer Father   . Colon cancer Neg Hx     Social History Social History   Tobacco Use  . Smoking status: Never Smoker  . Smokeless  tobacco: Never Used  Substance Use Topics  . Alcohol use: No  . Drug use: No     Allergies   Lotrel [amlodipine besy-benazepril hcl]; Other; and Chalk   Review of Systems Review of Systems  Constitutional: Negative.   HENT: Negative.   Respiratory: Negative.   Cardiovascular: Negative.   Gastrointestinal: Positive for abdominal pain and nausea. Negative for abdominal distention, anal bleeding, blood in stool, constipation, diarrhea, rectal pain and vomiting.  Genitourinary: Negative.   Musculoskeletal: Negative.   Skin: Negative.   Neurological: Negative.   All other systems reviewed and are negative.    Physical Exam Updated Vital Signs BP 132/77 (BP Location: Left Arm)   Pulse (!) 53   Temp 98.6 F (37 C) (Oral)   Resp 16   SpO2 96%   Physical Exam  Constitutional: Vital signs are normal. He appears well-developed and well-nourished.  Non-toxic appearance. He does not have a sickly appearance. He does not appear ill. No distress.  HENT:  Head: Atraumatic.  Mouth/Throat: Oropharynx is clear and moist.  Eyes: Pupils are equal, round, and reactive to light.  Neck: Normal range of motion. Neck supple.  Cardiovascular: Normal rate, regular rhythm, normal heart sounds and intact distal pulses. Exam reveals no friction rub.  No murmur heard. Pulmonary/Chest: Effort normal and breath sounds normal. No stridor. No respiratory distress. He has no wheezes. He has no rhonchi. He has no rales. He exhibits no tenderness.  Abdominal: Soft. Normal appearance. He exhibits no distension, no pulsatile liver, no fluid wave, no abdominal bruit, no ascites, no pulsatile midline mass and no mass. There is no hepatosplenomegaly. There is generalized tenderness. There is no rigidity, no rebound, no guarding, no CVA tenderness, no tenderness at McBurney's point and negative Murphy's sign.  Musculoskeletal: Normal range of motion.  Neurological: He is alert.  Skin: Skin is warm and dry. He is  not diaphoretic.  Psychiatric: He has a normal mood and affect.  Nursing note and vitals reviewed.    ED Treatments / Results  Labs (all labs ordered are listed, but only abnormal results are displayed) Labs Reviewed  COMPREHENSIVE METABOLIC PANEL - Abnormal; Notable for the following components:      Result Value   Sodium 132 (*)    Chloride 95 (*)    Glucose, Bld 117 (*)    Calcium 8.7 (*)    All other components within normal limits  CBC - Abnormal; Notable for the following components:   WBC 14.8 (*)    All other components within normal limits  URINALYSIS, ROUTINE W REFLEX MICROSCOPIC - Abnormal; Notable for the following components:   Hgb urine dipstick SMALL (*)    Ketones, ur 20 (*)    Bacteria, UA RARE (*)    All other components within normal limits  LIPASE, BLOOD    EKG None  Radiology Ct Abdomen Pelvis W Contrast  Result Date: 03/19/2018 CLINICAL DATA:  Abdominal pain and constipation since last night. EXAM: CT ABDOMEN AND PELVIS WITH CONTRAST TECHNIQUE: Multidetector CT imaging of the abdomen and pelvis was performed using the standard protocol following bolus administration of intravenous contrast. CONTRAST:  148mL ISOVUE-300 IOPAMIDOL (ISOVUE-300) INJECTION 61% COMPARISON:  CT abdomen dated 03/12/2010. FINDINGS: Lower chest: No acute findings. Numerous subcentimeter pleural based nodules at both lung bases, LEFT greater than RIGHT, incompletely imaged, not appreciably changed compared to the earlier CT of 03/12/2010 indicating benignity. Hepatobiliary: No focal liver abnormality is seen. Status post cholecystectomy. No biliary dilatation. Pancreas: Unremarkable. No pancreatic ductal dilatation or surrounding inflammatory changes. Spleen: Normal in size without focal abnormality. Adrenals/Urinary Tract: Adrenal glands appear normal. 5 mm obstructing stone within the proximal RIGHT ureter causing moderate RIGHT-sided hydronephrosis and perinephric edema. Additional  periureteral fluid stranding/inflammation to the level of the upper pelvis. No LEFT-sided renal or ureteral stone.  LEFT kidney appears normal. Stomach/Bowel: No dilated large or small bowel loops. No evidence of bowel wall inflammation. Appendix is normal. Mild colonic diverticulosis without evidence of acute diverticulitis. Stomach is unremarkable, partially decompressed. Vascular/Lymphatic: Aortic atherosclerosis. No enlarged abdominal or pelvic lymph nodes. Reproductive: Prostate is unremarkable. Other: No abscess collection.  No free intraperitoneal air. Musculoskeletal: Degenerative spondylitic changes of the scoliotic thoracolumbar spine, at least moderate in degree. DJD at the RIGHT hip joint, also at least moderate in degree with abutment of the RIGHT femoral head and superolateral acetabulum, and with associated osseous spurring. No acute or suspicious osseous finding. IMPRESSION: 1. Obstructing 5 mm stone within the proximal RIGHT ureter causing moderate RIGHT-sided hydronephrosis and perinephric inflammation. 2. Additional prominent RIGHT-sided periureteral inflammation/fluid stranding to the level of the upper pelvis, possibly reactive to the obstructing stone in the proximal RIGHT ureter, possibly indicating an additional recently passed ureteral stone. 3. Mild colonic diverticulosis without evidence of acute diverticulitis. 4.  Aortic Atherosclerosis (ICD10-I70.0). 5. Degenerative changes of the scoliotic thoracolumbar spine and RIGHT hip, as detailed above. Electronically Signed   By: Franki Cabot M.D.   On: 03/19/2018 17:07    Procedures Procedures (including critical care time)  Medications Ordered in ED Medications  iopamidol (ISOVUE-300) 61 % injection (has no administration in time range)  sodium chloride (PF) 0.9 % injection (has no administration in time range)  sodium chloride 0.9 % bolus 1,000 mL (0 mLs Intravenous Stopped 03/19/18 1709)  ondansetron (ZOFRAN) injection 4 mg (4 mg  Intravenous Given 03/19/18 1608)  iopamidol (ISOVUE-300) 61 % injection 100 mL (100 mLs Intravenous Contrast Given 03/19/18 1628)  morphine 4 MG/ML injection 4 mg (4 mg Intravenous Given 03/19/18 1705)  ketorolac (TORADOL) 15 MG/ML injection 15 mg (15 mg Intravenous Given 03/19/18 1740)     Initial Impression / Assessment and Plan / ED Course  I have reviewed the triage vital signs and the nursing notes.  Pertinent labs & imaging results that were available during my care of the patient were reviewed by me and considered in my medical decision making (see chart for details).  67 year old male who appears otherwise well presents for evaluation of generalized abdominal pain.  Pain started approximately 6 PM yesterday evening.  Generalized abdominal tenderness on exam, without rebound or guarding.  Pain does not radiate to his back.  Afebrile, nonseptic, non-ill-appearing.  Has had intermittent episodes of nausea without emesis.  Patient states his bowels are "not normal."  Last bowel movement  approximately 11 PM yesterday evening.  Denies melena or bright red blood per rectum.  Will obtain labs, urine and CT scan and reevaluate.  Patient does not want anything for pain at this time.  Labs with leukocytosis at 14.8 lipase 37, glucose mildly elevated at 117, sodium 132., Urinalysis negative for infection, small hgb, CT abd and pelvis with Obstructing 5 mm stone within the proximal RIGHT ureter causing moderate RIGHT-sided hydronephrosis and perinephric inflammation. 2. Additional prominent RIGHT-sided periureteral inflammation/fluid stranding to the level of the upper pelvis, possibly reactive to the obstructing stone in the proximal RIGHT ureter, possibly indicating an additional recently passed ureteral stone.    Patient does not meet the SIRS or Sepsis criteria.  On repeat exam patient does not have a surgical abdomin and there are no peritoneal signs.  No indication of appendicitis, bowel obstruction,  bowel perforation, cholecystitis, diverticulitis.   Discussed with patient treatment and plan with Flomax and pain medicine.  Patient is hemodynamically stable.  He is urinating without difficulty.  Discussed with patient follow-up with urology on Monday.  Discussed strict return precautions with patient.  Patient voiced understanding is agreeable for follow-up.  Patient able to tolerate p.o. intake in department.  Stable for DC home at this time  Patient was evaluated by my attending, Dr. Ellender Hose.  He agrees with the above treatment, plan disposition patient.     Final Clinical Impressions(s) / ED Diagnoses   Final diagnoses:  Nephrolithiasis    ED Discharge Orders         Ordered    tamsulosin (FLOMAX) 0.4 MG CAPS capsule  Daily     03/19/18 1742    HYDROcodone-acetaminophen (NORCO/VICODIN) 5-325 MG tablet  Every 6 hours PRN     03/19/18 1742           Jocelynn Gioffre A, PA-C 03/19/18 1800    Duffy Bruce, MD 03/20/18 1155

## 2018-03-19 NOTE — ED Triage Notes (Addendum)
Pt complains of abdominal pain and constipation since last night. Pt denies emesis or nausea. Pt does not feel like bowels are moving. Pt tried milk of magnesia, which he states helped him sleep.

## 2018-03-21 ENCOUNTER — Encounter (HOSPITAL_COMMUNITY): Payer: Self-pay | Admitting: Anesthesiology

## 2018-03-21 ENCOUNTER — Ambulatory Visit (HOSPITAL_COMMUNITY)
Admission: RE | Admit: 2018-03-21 | Discharge: 2018-03-21 | Disposition: A | Discharge status: Home / Self Care | Payer: BC Managed Care – PPO | Source: Ambulatory Visit | Attending: Urology | Admitting: Urology

## 2018-03-21 ENCOUNTER — Encounter (HOSPITAL_COMMUNITY)
Admission: RE | Disposition: A | Discharge status: Home / Self Care | Payer: Self-pay | Source: Ambulatory Visit | Attending: Urology

## 2018-03-21 ENCOUNTER — Encounter (HOSPITAL_COMMUNITY): Payer: Self-pay | Admitting: General Practice

## 2018-03-21 ENCOUNTER — Other Ambulatory Visit: Payer: Self-pay | Admitting: Urology

## 2018-03-21 DIAGNOSIS — N201 Calculus of ureter: Secondary | ICD-10-CM | POA: Insufficient documentation

## 2018-03-21 DIAGNOSIS — Z79899 Other long term (current) drug therapy: Secondary | ICD-10-CM | POA: Insufficient documentation

## 2018-03-21 DIAGNOSIS — Z841 Family history of disorders of kidney and ureter: Secondary | ICD-10-CM | POA: Insufficient documentation

## 2018-03-21 DIAGNOSIS — Z79891 Long term (current) use of opiate analgesic: Secondary | ICD-10-CM | POA: Insufficient documentation

## 2018-03-21 DIAGNOSIS — Z7951 Long term (current) use of inhaled steroids: Secondary | ICD-10-CM | POA: Insufficient documentation

## 2018-03-21 DIAGNOSIS — I10 Essential (primary) hypertension: Secondary | ICD-10-CM | POA: Insufficient documentation

## 2018-03-21 HISTORY — DX: Personal history of urinary calculi: Z87.442

## 2018-03-21 HISTORY — PX: EXTRACORPOREAL SHOCK WAVE LITHOTRIPSY: SHX1557

## 2018-03-21 SURGERY — LITHOTRIPSY, ESWL
Anesthesia: Regional

## 2018-03-21 MED ORDER — SODIUM CHLORIDE 0.9 % IV SOLN
INTRAVENOUS | Status: DC
  Administered 2018-03-21: 15:00:00 via INTRAVENOUS

## 2018-03-21 MED ORDER — LEVOFLOXACIN 500 MG PO TABS
500.0000 mg | ORAL_TABLET | ORAL | Status: AC
  Administered 2018-03-21: 500 mg via ORAL
  Filled 2018-03-21: qty 1

## 2018-03-21 MED ORDER — DIPHENHYDRAMINE HCL 25 MG PO CAPS
25.0000 mg | ORAL_CAPSULE | ORAL | Status: AC
  Administered 2018-03-21: 25 mg via ORAL
  Filled 2018-03-21: qty 1

## 2018-03-21 MED ORDER — DIAZEPAM 5 MG PO TABS
10.0000 mg | ORAL_TABLET | ORAL | Status: AC
  Administered 2018-03-21: 10 mg via ORAL
  Filled 2018-03-21: qty 2

## 2018-03-21 NOTE — H&P (Signed)
CC: I have ureteral stone.  HPI: Thomas Livingston is a 67 year-old male patient who is here for ureteral stone.  The problem is on the right side. He first stated noticing pain on 03/19/2018. This is his first kidney stone. He is currently having flank pain and nausea. He denies having back pain, groin pain, vomiting, fever, and chills. Pain is occuring on the right side. He has not caught a stone in his urine strainer since his symptoms began.   He has never had surgical treatment for calculi in the past.   He has a 45m right proximal stone on CT.      ALLERGIES: blood pressure med - Hives    MEDICATIONS: Tamsulosin Hcl 0.4 mg capsule 1 capsule PO Daily  Albuterol Sulfate 1 PO Daily  Fluticasone Propionate 1 PO Daily  Hydrocodone-Acetaminophen 1 PO Daily  Multivitamin  Rosuvastatin Calcium 1 PO Daily  Singulair 1 PO Daily  Symbicort 1 PO Daily     GU PSH: None     PSH Notes: oral surgery  colon surgery    NON-GU PSH: Cholecystectomy (laparoscopic)    GU PMH: Renal and ureteral calculus      PMH Notes:  1898-05-11 00:00:00 - Note: Normal Routine History And Physical Adult   NON-GU PMH: Arthritis    FAMILY HISTORY: Diabetes - Brother Kidney Cancer - Mother Kidney Stones - Brother, Runs in Family    Notes: Mother died at age 1515from cancer  Father died at age 7491from lung cancer  has 1 daughter    SOCIAL HISTORY: Marital Status: Married Current Smoking Status: Patient has never smoked.   Tobacco Use Assessment Completed: Used Tobacco in last 30 days? Drinks 2 caffeinated drinks per day. 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  Gastrointestinal (Upper):    Patient denies nausea, vomiting, and indigestion/ heartburn.  Gastrointestinal (Lower):   Patient reports constipation. Patient denies diarrhea.  Constitutional:   Patient denies fever, night sweats, weight loss, and fatigue.  Skin:   Patient denies skin rash/ lesion and itching.  Eyes:   Patient denies double vision and blurred vision.  Ears/ Nose/ Throat:   Patient denies sore throat and sinus problems.  Hematologic/Lymphatic:   Patient denies swollen glands and easy bruising.  Cardiovascular:   Patient denies leg swelling and chest pains.  Respiratory:   Patient denies cough and shortness of breath.  Endocrine:   Patient denies excessive thirst.  Musculoskeletal:   Patient denies back pain and joint pain.  Neurological:   Patient reports headaches. Patient denies dizziness.  Psychologic:   Patient denies depression and anxiety.   VITAL SIGNS:      03/21/2018 09:34 AM  Weight 237 lb / 107.5 kg  Height 74 in / 187.96 cm  BP 159/92 mmHg  Pulse 64 /min  Temperature 98.2 F / 36.7 C  BMI 30.4 kg/m   MULTI-SYSTEM PHYSICAL EXAMINATION:    Constitutional: Well-nourished. No physical deformities. Normally developed. Good grooming.  Neck: Neck symmetrical, not swollen. Normal tracheal position.  Respiratory: Normal breath sounds. No labored breathing, no use of accessory muscles.   Cardiovascular: Regular rate and rhythm. No murmur, no gallop. .Marland Kitchen  Lymphatic: No  enlargement, no tenderness of axillae, supraclavicular, neck lymph nodes.  Skin: No paleness, no jaundice, no cyanosis. No lesion, no ulcer, no rash.  Neurologic / Psychiatric: Oriented to time, oriented to place, oriented to person. No depression, no anxiety, no agitation.  Gastrointestinal: Abdominal tenderness in the RLQ and flank. No mass, no rigidity, non obese abdomen.   Musculoskeletal: Normal gait and station of head and neck.     PAST DATA REVIEWED:  Source Of History:  Patient  Lab Test Review:   BMP, CBC with Diff   Records Review:   Previous Doctor Records, Previous Patient Records  X-Ray Review: [facility]: Reviewed Films. Discussed With Patient.  C.T. Abdomen/Pelvis: Reviewed Films. Reviewed Report. Discussed With Patient.     04/01/05  PSA  Total PSA 0.52    Notes:                     ER records reviewed. Prior office notes reviewed. He had a 54m RLP on CT in 2007.   PROCEDURES:         [facility] - [id number] A single view of the abdomen is obtained. There is a 540mstone on the right between L3 and L4. He has pelvic phleboliths. He has some lumbar DDD and a left THA. No gas or soft tissue abnormalities are noted.                Urinalysis w/Scope Dipstick Dipstick Cont'd Micro  Color: Yellow Bilirubin: Neg mg/dL WBC/hpf: NS (Not Seen)  Appearance: Clear Ketones: Neg mg/dL RBC/hpf: 0 - 2/hpf  Specific Gravity: <=1.005 Blood: 1+ ery/uL Bacteria: NS (Not Seen)  pH: 5.5 Protein: Neg mg/dL Cystals: NS (Not Seen)  Glucose: Neg mg/dL Urobilinogen: 0.2 mg/dL Casts: NS (Not Seen)    Nitrites: Neg Trichomonas: Not Present    Leukocyte Esterase: Neg leu/uL Mucous: Not Present      Epithelial Cells: NS (Not Seen)      Yeast: NS (Not Seen)      Sperm: Not Present    ASSESSMENT:      ICD-10 Details  1 GU:   Ureteral calculus - N20.1 Right, His stone has not progressed. I discussed continued MET, ESWL and URS. He would like to proceed with ESWL today. I will get that set up. I reviewed the risks of ESWL including bleeding, infection, injury to the kidney or adjacent structures, failure to fragment the stone, need for ancillary procedures, thrombotic events, cardiac arrhythmias and sedation complications.    PLAN:           Orders X-Rays: KUB          Schedule Return Visit/Planned Activity: ASAP - Schedule Surgery          Document Letter(s):  Created for Patient: Clinical Summary

## 2018-03-22 ENCOUNTER — Encounter (HOSPITAL_COMMUNITY): Payer: Self-pay | Admitting: Urology

## 2018-03-31 ENCOUNTER — Encounter: Payer: Self-pay | Admitting: Allergy

## 2018-03-31 ENCOUNTER — Ambulatory Visit: Payer: BC Managed Care – PPO | Admitting: Allergy

## 2018-03-31 VITALS — BP 126/82 | HR 68 | Resp 16

## 2018-03-31 DIAGNOSIS — J3089 Other allergic rhinitis: Secondary | ICD-10-CM | POA: Diagnosis not present

## 2018-03-31 DIAGNOSIS — J4540 Moderate persistent asthma, uncomplicated: Secondary | ICD-10-CM | POA: Diagnosis not present

## 2018-03-31 DIAGNOSIS — L509 Urticaria, unspecified: Secondary | ICD-10-CM | POA: Diagnosis not present

## 2018-03-31 DIAGNOSIS — J45909 Unspecified asthma, uncomplicated: Secondary | ICD-10-CM | POA: Insufficient documentation

## 2018-03-31 HISTORY — DX: Urticaria, unspecified: L50.9

## 2018-03-31 MED ORDER — CETIRIZINE HCL 10 MG PO TABS: 10.0000 mg | ORAL_TABLET | Freq: Two times a day (BID) | ORAL | 3 refills | Status: DC

## 2018-03-31 MED ORDER — FAMOTIDINE 20 MG PO TABS: 20.0000 mg | ORAL_TABLET | Freq: Two times a day (BID) | ORAL | 3 refills | Status: DC

## 2018-03-31 NOTE — Assessment & Plan Note (Signed)
Well-controlled with below regimen.  Today's spirometry was normal.  Continue Symbicort 160 2 puffs twice a day.  Continue Singulair daily.  May use albuterol rescue inhaler 2 puffs or nebulizer every 4 to 6 hours as needed for shortness of breath, chest tightness, coughing, and wheezing. May use albuterol rescue inhaler 2 puffs 5 to 15 minutes prior to strenuous physical activities.

## 2018-03-31 NOTE — Assessment & Plan Note (Addendum)
Past history - 2019 skin testing was positive to mold, cat, dog and dust mite.  Continue Singulair 10 mg daily.  Continue Flonase 1-2 sprays daily.

## 2018-03-31 NOTE — Progress Notes (Signed)
Follow Up Note  RE: ULYSESS WITZ MRN: [id number] DOB: 03-Mar-1951 Date of Office Visit: 03/31/2018  Referring provider: Susy Frizzle, MD Primary care provider: Susy Frizzle, MD  Chief Complaint: Urticaria and Asthma  History of Present Illness: I had the pleasure of seeing Thomas Livingston for a follow up visit at the [facility] on 03/31/2018. He is a 67 y.o. male, who is being followed for asthma and allergic rhinitis. Today he is here for new complaint of hives. His previous allergy office visit was on 03/01/2018 with Dr. Neldon Mc.   Patient started to have a break out in the groin, armpit and arms. He also noticed some slight tongue angioedema. He took benadryl at [facility] and 7AM which helped. Patient had ground beef and meatloaf for dinner which he usually does not have. He hasn't had red meat for awhile as he usually eats chicken. However, he did have a hamburger 2 weeks ago with no issues.   He had kidney stones last week which required lithotripsy and was not taking some of his medications as prescribed. He also took a few Vicodins for the pain but none the last week.   Patient stopped zyrtec and Pepcid in October and did not have issues with hives before this episode.   AR: Still taking Flonase with good benefit.  Asthma:  Currently on Singulair daily and Symbicort 160 2 puffs BID and doing well.   Assessment and Plan: [name] is a 67 y.o. male with: Urticaria Hives since April 2019 and was doing well up until this morning. Patient woke up at 5AM covered in hives with slight tongue angioedema. Took benadryl x 2 and now better but still has hives on the torso. Only new thing he ate last night was meatloaf. He did have a hamburger 2 weeks ago with no issues. Patient recently had lithotripsy as well. Patient has weaned off antihistamines.   Based on clinical history there are several factors that may have triggered this episode including recent stress  from lithotripsy, new foods and no antihistamines on board.  Get bloodwork as below.   Start zyrtec 10mg  twice a day  Start Pepcid 20mg  twice a day  For mild symptoms you can take over the counter antihistamines such as Benadryl and monitor symptoms closely. If symptoms worsen or if you have severe symptoms including breathing issues, throat closure, significant swelling, whole body hives, severe diarrhea and vomiting, lightheadedness then seek immediate medical care.  Avoid red meat for now.    Other allergic rhinitis Past history - 2019 skin testing was positive to mold, cat, dog and dust mite.  Continue Singulair 10 mg daily.  Continue Flonase 1-2 sprays daily.  Asthma, well controlled Well-controlled with below regimen.  Today's spirometry was normal.  Continue Symbicort 160 2 puffs twice a day.  Continue Singulair daily.  May use albuterol rescue inhaler 2 puffs or nebulizer every 4 to 6 hours as needed for shortness of breath, chest tightness, coughing, and wheezing. May use albuterol rescue inhaler 2 puffs 5 to 15 minutes prior to strenuous physical activities.  Return in about 4 weeks (around 04/28/2018).  Meds ordered this encounter  Medications  . cetirizine (ZYRTEC ALLERGY) 10 MG tablet    Sig: Take 1 tablet (10 mg total) by mouth 2 (two) times daily.    Dispense:  60 tablet    Refill:  3  . famotidine (PEPCID) 20 MG tablet    Sig: Take 1 tablet (  20 mg total) by mouth 2 (two) times daily.    Dispense:  60 tablet    Refill:  3    Lab Orders     Alpha-Gal Panel  Diagnostics: Spirometry:  Tracings reviewed. His effort: Good reproducible efforts. FVC: 4.16L FEV1: 3.03L, 80% predicted FEV1/FVC ratio: 73% Interpretation: Spirometry consistent with normal pattern.  Please see scanned spirometry results for details.  Medication List:  Current Outpatient Medications  Medication Sig Dispense Refill  . albuterol (PROVENTIL HFA;VENTOLIN HFA) 108 (90 Base)  MCG/ACT inhaler Inhale 1-2 puffs into the lungs every 6 (six) hours as needed for wheezing or shortness of breath. 18 g 1  . aspirin 81 MG tablet Take 81 mg by mouth daily.    . fluticasone (FLONASE) 50 MCG/ACT nasal spray Use 2 sprays in each  nostril daily 48 g 3  . montelukast (SINGULAIR) 10 MG tablet TAKE 1 TABLET BY MOUTH EVERYDAY AT BEDTIME 90 tablet 0  . Multiple Vitamin (MULTIVITAMIN) tablet Take 1 tablet by mouth daily.    . rosuvastatin (CRESTOR) 10 MG tablet TAKE 1 TABLET BY MOUTH EVERY DAY 90 tablet 3  . SYMBICORT 160-4.5 MCG/ACT inhaler TAKE 2 PUFFS BY MOUTH TWICE A DAY 30.6 Inhaler 0  . cetirizine (ZYRTEC ALLERGY) 10 MG tablet Take 1 tablet (10 mg total) by mouth 2 (two) times daily. 60 tablet 3  . famotidine (PEPCID) 20 MG tablet Take 1 tablet (20 mg total) by mouth 2 (two) times daily. 60 tablet 3   No current facility-administered medications for this visit.    Allergies: Allergies  Allergen Reactions  . Lotrel [Amlodipine Besy-Benazepril Hcl] Hives and Swelling  . Other Swelling    lycra   . Chalk Other (See Comments)    sneezing   I reviewed his past medical history, social history, family history, and environmental history and no significant changes have been reported from previous visit on 03/01/2018.  Review of Systems  Constitutional: Negative for appetite change, chills, fever and unexpected weight change.  HENT: Negative for congestion and rhinorrhea.   Eyes: Negative for itching.  Respiratory: Negative for cough, chest tightness, shortness of breath and wheezing.   Gastrointestinal: Negative for abdominal pain.  Skin: Positive for rash.  Neurological: Negative for headaches.   Objective: BP 126/82 (BP Location: Left Arm, Patient Position: Sitting, Cuff Size: Normal)   Pulse 68   Resp 16   SpO2 96%  There is no height or weight on file to calculate BMI. Physical Exam  Constitutional: He is oriented to person, place, and time. He appears well-developed  and well-nourished.  HENT:  Head: Normocephalic and atraumatic.  Right Ear: External ear normal.  Left Ear: External ear normal.  Nose: Nose normal.  Mouth/Throat: Oropharynx is clear and moist.  Eyes: Conjunctivae and EOM are normal.  Neck: Neck supple.  Cardiovascular: Normal rate, regular rhythm and normal heart sounds. Exam reveals no gallop and no friction rub.  No murmur heard. Pulmonary/Chest: Effort normal and breath sounds normal. He has no wheezes. He has no rales.  Lymphadenopathy:    He has no cervical adenopathy.  Neurological: He is alert and oriented to person, place, and time.  Skin: Skin is warm. Rash noted.  Diffuse urticarial rash on anterior torso area, few circular hives on the posterior scalp area.   Psychiatric: He has a normal mood and affect. His behavior is normal.  Nursing note and vitals reviewed.  Previous notes and tests were reviewed. The plan was reviewed with the patient/family,  and all questions/concerned were addressed.  It was my pleasure to see Rasheen today and participate in his care. Please feel free to contact me with any questions or concerns.  Sincerely,  Rexene Alberts, DO Allergy & Immunology  [facility] office: [phone] [place]

## 2018-03-31 NOTE — Patient Instructions (Addendum)
Start zyrtec 10mg  twice a day Start Pepcid 20mg  twice a day  For mild symptoms you can take over the counter antihistamines such as Benadryl and monitor symptoms closely. If symptoms worsen or if you have severe symptoms including breathing issues, throat closure, significant swelling, whole body hives, severe diarrhea and vomiting, lightheadedness then seek immediate medical care.  Get bloodwork  Avoid red meat for now.  Continue Singulair daily Continue Symbicort 160 2 puffs twice a day May use albuterol rescue inhaler 2 puffs or nebulizer every 4 to 6 hours as needed for shortness of breath, chest tightness, coughing, and wheezing. May use albuterol rescue inhaler 2 puffs 5 to 15 minutes prior to strenuous physical activities.  Continue Flonase 1-2 sprays daily  Take prednisone if hives not improving.  Follow up in 4 weeks.

## 2018-03-31 NOTE — Assessment & Plan Note (Addendum)
Hives since April 2019 and was doing well up until this morning. Patient woke up at 5AM covered in hives with slight tongue angioedema. Took benadryl x 2 and now better but still has hives on the torso. Only new thing he ate last night was meatloaf. He did have a hamburger 2 weeks ago with no issues. Patient recently had lithotripsy as well. Patient has weaned off antihistamines.   Based on clinical history there are several factors that may have triggered this episode including recent stress from lithotripsy, new foods and no antihistamines on board.  Get bloodwork as below.   Start zyrtec 10mg  twice a day  Start Pepcid 20mg  twice a day  If not improving then start prednisone pak   For mild symptoms you can take over the counter antihistamines such as Benadryl and monitor symptoms closely. If symptoms worsen or if you have severe symptoms including breathing issues, throat closure, significant swelling, whole body hives, severe diarrhea and vomiting, lightheadedness then seek immediate medical care.  Avoid red meat for now.

## 2018-04-05 LAB — ALPHA-GAL PANEL
Alpha Gal IgE*: 8.54 kU/L — ABNORMAL HIGH (ref <0.10)
Beef (Bos spp) IgE: 6.05 kU/L — ABNORMAL HIGH (ref <0.35)
Class Interpretation: 2
Class Interpretation: 2
Class Interpretation: 3
Lamb/Mutton (Ovis spp) IgE: 1.25 kU/L — ABNORMAL HIGH (ref <0.35)
Pork (Sus spp) IgE: 3.03 kU/L — ABNORMAL HIGH (ref <0.35)

## 2018-04-18 ENCOUNTER — Telehealth: Payer: Self-pay | Admitting: Allergy and Immunology

## 2018-04-18 NOTE — Telephone Encounter (Signed)
Pt called and would like to talk with a nurse about his meds. Because he was tested and found out that he has a allergy to red meat. [phone].

## 2018-04-18 NOTE — Telephone Encounter (Signed)
Patient called states he is going to ween himself off ranitidine cause he is symptom free and staying away from meat advised I would let Dr Maudie Mercury know. States he will follow up Feb 2020 with Dr Neldon Mc

## 2018-04-22 ENCOUNTER — Other Ambulatory Visit: Payer: Self-pay | Admitting: Allergy and Immunology

## 2018-04-22 ENCOUNTER — Other Ambulatory Visit: Payer: Self-pay

## 2018-05-13 ENCOUNTER — Ambulatory Visit: Payer: BC Managed Care – PPO | Admitting: Family Medicine

## 2018-05-13 VITALS — BP 122/80 | HR 80 | Resp 16 | Wt 232.8 lb

## 2018-05-13 DIAGNOSIS — Z91018 Allergy to other foods: Secondary | ICD-10-CM | POA: Diagnosis not present

## 2018-05-13 DIAGNOSIS — M67431 Ganglion, right wrist: Secondary | ICD-10-CM

## 2018-05-13 NOTE — Progress Notes (Signed)
Patient ID: Thomas Livingston, male    DOB: [birth year], 68 y.o.   MRN: [id number]  PCP: Thomas Frizzle, MD  Chief Complaint  Patient presents with  . right forearm nodule    Subjective:   Thomas HAEN is a 68 y.o. male, presents to clinic with CC of several months of bump to right forearm, dorsal aspect - started very defined like a marble, it has gottern a little less defined and sore with radiation up and down his arm, hurts more with movement or palpation.  No injury, no redness.      Patient Active Problem List   Diagnosis Date Noted  . Allergy to alpha-gal 05/18/2018  . Asthma, well controlled 03/31/2018  . Other allergic rhinitis 03/31/2018  . Rectal mass 08/27/2014  . Hypertension   . Hyperlipidemia      Prior to Admission medications   Medication Sig Start Date End Date Taking? Authorizing Provider  albuterol (PROVENTIL HFA;VENTOLIN HFA) 108 (90 Base) MCG/ACT inhaler Inhale 1-2 puffs into the lungs every 6 (six) hours as needed for wheezing or shortness of breath. 09/01/17   Kozlow, Donnamarie Poag, MD  aspirin 81 MG tablet Take 81 mg by mouth daily.    [provider]  cetirizine (ZYRTEC ALLERGY) 10 MG tablet Take 1 tablet (10 mg total) by mouth 2 (two) times daily. 03/31/18   Garnet Sierras, DO  famotidine (PEPCID) 20 MG tablet Take 1 tablet (20 mg total) by mouth 2 (two) times daily. 03/31/18   Garnet Sierras, DO  famotidine (PEPCID) 40 MG tablet TAKE 1 TABLET BY MOUTH EVERY DAY 04/22/18   Garnet Sierras, DO  fluticasone [facility]) 50 MCG/ACT nasal spray Use 2 sprays in each  nostril daily 01/10/14   Thomas Frizzle, MD  montelukast (SINGULAIR) 10 MG tablet TAKE 1 TABLET BY MOUTH EVERYDAY AT BEDTIME 02/21/18   Kozlow, Donnamarie Poag, MD  Multiple Vitamin (MULTIVITAMIN) tablet Take 1 tablet by mouth daily.    [provider]  rosuvastatin (CRESTOR) 10 MG tablet TAKE 1 TABLET BY MOUTH EVERY DAY 01/04/18   Thomas Frizzle, MD  SYMBICORT 160-4.5 MCG/ACT inhaler TAKE 2 PUFFS  BY MOUTH TWICE A DAY 02/28/18   Kozlow, Donnamarie Poag, MD     Allergies  Allergen Reactions  . Lotrel [Amlodipine Besy-Benazepril Hcl] Hives and Swelling  . Meat [Alpha-Gal]   . Other Swelling    lycra   . Chalk Other (See Comments)    sneezing     Family History  Problem Relation Age of Onset  . Thyroid cancer Mother   . Lung cancer Father   . Colon cancer Neg Hx      Social History   Socioeconomic History  . Marital status: Married    Spouse name: Not on file  . Number of children: Not on file  . Years of education: Not on file  . Highest education level: Not on file  Occupational History  . Not on file  Social Needs  . Financial resource strain: Not on file  . Food insecurity:    Worry: Not on file    Inability: Not on file  . Transportation needs:    Medical: Not on file    Non-medical: Not on file  Tobacco Use  . Smoking status: Never Smoker  . Smokeless tobacco: Never Used  Substance and Sexual Activity  . Alcohol use: No  . Drug use: No  . Sexual activity: Not on file  Lifestyle  . Physical activity:    Days per week: Not on file    Minutes per session: Not on file  . Stress: Not on file  Relationships  . Social connections:    Talks on phone: Not on file    Gets together: Not on file    Attends religious service: Not on file    Active member of club or organization: Not on file    Attends meetings of clubs or organizations: Not on file    Relationship status: Not on file  . Intimate partner violence:    Fear of current or ex partner: Not on file    Emotionally abused: Not on file    Physically abused: Not on file    Forced sexual activity: Not on file  Other Topics Concern  . Not on file  Social History Narrative  . Not on file     Review of Systems  Constitutional: Negative.   HENT: Negative.   Eyes: Negative.   Respiratory: Negative.   Cardiovascular: Negative.   Gastrointestinal: Negative.   Endocrine: Negative.   Genitourinary:  Negative.   Musculoskeletal: Negative.   Skin: Negative.   Allergic/Immunologic: Negative.   Neurological: Negative.   Hematological: Negative.   Psychiatric/Behavioral: Negative.   All other systems reviewed and are negative.      Objective:    Vitals:   05/13/18 1233  BP: 122/80  Pulse: 80  Resp: 16  SpO2: 98%  Weight: 232 lb 12.8 oz (105.6 kg)      Physical Exam Vitals signs and nursing note reviewed.  Constitutional:      General: He is not in acute distress.    Appearance: He is well-developed. He is not ill-appearing, toxic-appearing or diaphoretic.  HENT:     Head: Normocephalic and atraumatic.     Nose: Nose normal.  Eyes:     General:        Right eye: No discharge.        Left eye: No discharge.     Conjunctiva/sclera: Conjunctivae normal.  Neck:     Trachea: No tracheal deviation.  Cardiovascular:     Rate and Rhythm: Normal rate and regular rhythm.  Pulmonary:     Effort: Pulmonary effort is normal. No respiratory distress.     Breath sounds: No stridor.  Musculoskeletal: Normal range of motion.     Right wrist: He exhibits tenderness and swelling. He exhibits normal range of motion, no bony tenderness and no crepitus.     Right forearm: He exhibits tenderness and edema. He exhibits no bony tenderness and no swelling.     Comments: Right dorsal distal forearm, roughly 1.5 cm circular nodule, palpable with mild tenderness, 2 cm of circumferential mild edema of overlying skin, no erythema  Skin:    General: Skin is warm and dry.     Findings: No rash.  Neurological:     Mental Status: He is alert.     Motor: No abnormal muscle tone.     Coordination: Coordination normal.  Psychiatric:        Behavior: Behavior normal.           Assessment & Plan:      ICD-10-CM   1. Ganglion of right forearm M67.431 Ambulatory referral to Orthopedic Surgery  2. Allergy to alpha-gal Z91.018     Nodule to distal dorsal right forearm x several months, has  gradually enlarged and become tender, it is palpable with some mild edema overlying, no  erythema.  Suspect  ganglion cyst - would likely need US imaging.  Pt prefers specialist eval - refer to hand specialist/ortho, would have likely bedside US and be able to eval, dx and tx.    Pt was dx with Alpha GAL sensitivity - since being dx and avoiding red meat he has been able to stop most of his medicines including antihistamines, Pepcid, Singulair and inhalers he is no longer having reactivity in his lungs and shortness of breath and wheeze.  This has been updated in chart.   Delsa Grana, PA-C 05/18/18 9:35 PM

## 2018-05-13 NOTE — Patient Instructions (Signed)
Refer to orthopedic surgeon.  They can usually get you in pretty fast   Ganglion Cyst  A ganglion cyst is a non-cancerous, fluid-filled lump that occurs near a joint or tendon. The cyst grows out of a joint or the lining of a tendon. Ganglion cysts most often develop in the hand or wrist, but they can also develop in the shoulder, elbow, hip, knee, ankle, or foot. Ganglion cysts are ball-shaped or egg-shaped. Their size can range from the size of a pea to larger than a grape. Increased activity may cause the cyst to get bigger because more fluid starts to build up. What are the causes? The exact cause of this condition is not known, but it may be related to:  Inflammation or irritation around the joint.  An injury.  Repetitive movements or overuse.  Arthritis. What increases the risk? You are more likely to develop this condition if:  You are a woman.  You are 46-53 years old. What are the signs or symptoms? The main symptom of this condition is a lump. It most often appears on the hand or wrist. In many cases, there are no other symptoms, but a cyst can sometimes cause:  Tingling.  Pain.  Numbness.  Muscle weakness.  Weak grip.  Less range of motion in a joint. How is this diagnosed? Ganglion cysts are usually diagnosed based on a physical exam. Your health care provider will feel the lump and may shine a light next to it. If it is a ganglion cyst, the light will likely shine through it. Your health care provider may order an X-ray, ultrasound, or MRI to rule out other conditions. How is this treated? Ganglion cysts often go away on their own without treatment. If you have pain or other symptoms, treatment may be needed. Treatment is also needed if the ganglion cyst limits your movement or if it gets infected. Treatment may include:  Wearing a brace or splint on your wrist or finger.  Taking anti-inflammatory medicine.  Having fluid drained from the lump with a needle  (aspiration).  Getting a steroid injected into the joint.  Having surgery to remove the ganglion cyst.  Placing a pad on your shoe or wearing shoes that will not rub against the cyst if it is on your foot. Follow these instructions at home:  Do not press on the ganglion cyst, poke it with a needle, or hit it.  Take over-the-counter and prescription medicines only as told by your health care provider.  If you have a brace or splint: ? Wear it as told by your health care provider. ? Remove it as told by your health care provider. Ask if you need to remove it when you take a shower or a bath.  Watch your ganglion cyst for any changes.  Keep all follow-up visits as told by your health care provider. This is important. Contact a health care provider if:  Your ganglion cyst becomes larger or more painful.  You have pus coming from the lump.  You have weakness or numbness in the affected area.  You have a fever or chills. Get help right away if:  You have a fever and have any of these in the cyst area: ? Increased redness. ? Red streaks. ? Swelling. Summary  A ganglion cyst is a non-cancerous, fluid-filled lump that occurs near a joint or tendon.  Ganglion cysts most often develop in the hand or wrist, but they can also develop in the shoulder, elbow, hip,  knee, ankle, or foot.  Ganglion cysts often go away on their own without treatment. This information is not intended to replace advice given to you by your health care provider. Make sure you discuss any questions you have with your health care provider. Document Released: 04/24/2000 Document Revised: 12/25/2016 Document Reviewed: 12/25/2016 Elsevier Interactive Patient Education  2019 [employer].

## 2018-05-18 ENCOUNTER — Encounter: Payer: Self-pay | Admitting: Family Medicine

## 2018-05-18 DIAGNOSIS — Z91018 Allergy to other foods: Secondary | ICD-10-CM | POA: Insufficient documentation

## 2018-05-19 ENCOUNTER — Ambulatory Visit (INDEPENDENT_AMBULATORY_CARE_PROVIDER_SITE_OTHER): Payer: BC Managed Care – PPO | Admitting: Orthopaedic Surgery

## 2018-05-19 ENCOUNTER — Ambulatory Visit (INDEPENDENT_AMBULATORY_CARE_PROVIDER_SITE_OTHER): Payer: BC Managed Care – PPO

## 2018-05-19 ENCOUNTER — Encounter (INDEPENDENT_AMBULATORY_CARE_PROVIDER_SITE_OTHER): Payer: Self-pay | Admitting: Orthopaedic Surgery

## 2018-05-19 DIAGNOSIS — M79631 Pain in right forearm: Secondary | ICD-10-CM | POA: Diagnosis not present

## 2018-05-19 NOTE — Progress Notes (Signed)
Office Visit Note   Patient: Thomas Livingston           Date of Birth: March 26, 1951           MRN: [id number] Visit Date: 05/19/2018              Requested by: Delsa Grana, PA-C [address] [place] [address] PCP: Susy Frizzle, MD   Assessment & Plan: Visit Diagnoses:  1. Pain of right forearm     Plan: Impression is extensor tendon sheath cyst dorsum of the right forearm.  At this point, this does not appear to be bothersome.  Reassurance given.  If this becomes painful, he will follow-up with [place] to have it taken out.   Follow-Up Instructions: Return if symptoms worsen or fail to improve.   Orders:  Orders Placed This Encounter  Procedures  . XR Forearm Right   No orders of the defined types were placed in this encounter.     Procedures: No procedures performed   Clinical Data: No additional findings.   Subjective: Chief Complaint  Patient presents with  . Right Forearm - Pain    HPI patient is a pleasant 68 year old gentleman who presents to our clinic today with concerns about a cyst to the dorsum of his forearm.  He noticed this approximately 3 to 4 months ago.  No injury or change in activity.  It progressively increased in size and then ruptured a few weeks ago.  He had diffuse swelling and tenderness to the area around that time.  The swelling and tenderness has since resolved.  The cyst has somewhat decreased in size.  He denies any pain or limitations from this.  No numbness, tingling or burning.  No fevers or chills.  Review of Systems as detailed in HPI.  All others reviewed and are negative.   Objective: Vital Signs: There were no vitals taken for this visit.  Physical Exam well-developed well-nourished gentleman in no acute distress.  Alert and oriented x3.  Ortho Exam examination of his right forearm reveals a 1 x 1 cm mobile nodule along the extensor tendon to the dorsum of the forearm.  This is nontender.  No skin changes.  He has  full range of motion of his wrist, forearm and all 5 fingers.  Full strength.  He is neurovascularly intact distally.  Specialty Comments:  No specialty comments available.  Imaging: Xr Forearm Right  Result Date: 05/19/2018 Slight soft tissue swelling dorsum of the forearm.  Otherwise no acute findings    PMFS History: Patient Active Problem List   Diagnosis Date Noted  . Pain of right forearm 05/19/2018  . Allergy to alpha-gal 05/18/2018  . Asthma, well controlled 03/31/2018  . Other allergic rhinitis 03/31/2018  . Rectal mass 08/27/2014  . Hypertension   . Hyperlipidemia    Past Medical History:  Diagnosis Date  . Allergy   . Arthritis    left hip  . Asthma   . History of kidney stones   . Hyperlipidemia   . Hypertension 2012   no bp meds since 2012  . Multiple lung nodules on CT 2007   no current follow up done  . Psoriasis    both legs  . Urticaria   . Urticaria 03/31/2018    Family History  Problem Relation Age of Onset  . Thyroid cancer Mother   . Lung cancer Father   . Colon cancer Neg Hx     Past Surgical  History:  Procedure Laterality Date  . CHOLECYSTECTOMY  2007  . EUS N/A 06/28/2014   Procedure: LOWER ENDOSCOPIC ULTRASOUND (EUS);  Surgeon: Milus Banister, MD;  Location: [facility];  Service: Endoscopy;  Laterality: N/A;  . EXTRACORPOREAL SHOCK WAVE LITHOTRIPSY N/A 03/21/2018   Procedure: EXTRACORPOREAL SHOCK WAVE LITHOTRIPSY (ESWL);  Surgeon: Irine Seal, MD;  Location: [facility];  Service: Urology;  Laterality: N/A;  . HIP ARTHROPLASTY Left 2018  . [place]  . PROCTOSCOPY N/A 08/27/2014   Procedure: PROCTOSCOPY;  Surgeon: Leighton Ruff, MD;  Location: [facility];  Service: General;  Laterality: N/A;  . TRANSANAL RECTAL RESECTION N/A 08/27/2014   Procedure: TRANSANAL RESECTION OF RECTAL TUMOR;  Surgeon: Leighton Ruff, MD;  Location: [facility];  Service: General;  Laterality: N/A;   Social History   Occupational History  . Not on file    Tobacco Use  . Smoking status: Never Smoker  . Smokeless tobacco: Never Used  Substance and Sexual Activity  . Alcohol use: No  . Drug use: No  . Sexual activity: Not on file

## 2018-05-23 ENCOUNTER — Other Ambulatory Visit: Payer: Self-pay | Admitting: Allergy and Immunology

## 2018-06-28 ENCOUNTER — Ambulatory Visit: Payer: BC Managed Care – PPO | Admitting: Allergy and Immunology

## 2018-07-19 ENCOUNTER — Ambulatory Visit: Payer: BC Managed Care – PPO | Admitting: Allergy and Immunology

## 2018-11-18 ENCOUNTER — Other Ambulatory Visit: Payer: Self-pay | Admitting: *Deleted

## 2018-11-18 MED ORDER — EPINEPHRINE 0.3 MG/0.3ML IJ SOAJ: 0.3000 mg | INTRAMUSCULAR | 1 refills | Status: DC | PRN

## 2018-11-29 ENCOUNTER — Encounter: Payer: Self-pay | Admitting: Family Medicine

## 2018-11-29 ENCOUNTER — Ambulatory Visit: Payer: BC Managed Care – PPO | Admitting: Family Medicine

## 2018-11-29 ENCOUNTER — Other Ambulatory Visit: Payer: Self-pay

## 2018-11-29 VITALS — BP 130/80 | HR 57 | Temp 97.9°F | Resp 15 | Ht 74.0 in | Wt 228.5 lb

## 2018-11-29 DIAGNOSIS — L03115 Cellulitis of right lower limb: Secondary | ICD-10-CM | POA: Diagnosis not present

## 2018-11-29 MED ORDER — PREDNISONE 20 MG PO TABS: ORAL_TABLET | ORAL | 0 refills | Status: DC

## 2018-11-29 NOTE — Patient Instructions (Signed)
Use benadryl 25-50 mg every 4-6 hours as needed for itching, swelling and rash  Start the prednisone if the itching or swelling is spreading.  Come back if any other changes.   Tick Bite Information, Adult  Ticks are insects that can bite. Most ticks live in shrubs and grassy areas. They climb onto people and animals that go by. Then they bite. Some ticks carry germs that can make you sick. How can I prevent tick bites?  Use an insect repellent that has 20% or higher of the ingredients DEET, picaridin, or IR3535. Put this insect repellent on: ? Bare skin. ? The tops of your boots. ? Your pant legs. ? The ends of your sleeves.  If you use an insect repellent that has the ingredient permethrin, make sure to follow the instructions on the bottle. Treat the following: ? Clothing. ? Supplies. ? Boots. ? Tents.  Wear long sleeves, long pants, and light colors.  Tuck your pant legs into your socks.  Stay in the middle of the trail.  Try not to walk through long grass.  Before going inside your house, check your clothes, hair, and skin for ticks. Make sure to check your head, neck, armpits, waist, groin, and joint areas.  Check for ticks every day.  When you come indoors: ? Wash your clothes right away. ? Shower right away. ? Dry your clothes in a dryer on high heat for 60 minutes or more. What is the right way to remove a tick? Remove a tick from your skin as soon as possible.  To remove a tick that is crawling on your skin: ? Go outdoors and brush the tick off. ? Use tape or a lint roller.  To remove a tick that is biting: ? Wash your hands. ? If you have latex gloves, put them on. ? Use tweezers, curved forceps, or a tick-removal tool to grasp the tick. Grasp the tick as close to your skin and as close to the tick's head as possible. ? Gently pull up until the tick lets go.  Try to keep the tick's head attached to its body.  Do not twist or jerk the tick.  Do not  squeeze or crush the tick. Do not try to remove a tick with heat, alcohol, petroleum jelly, or fingernail polish. How should I get rid of a tick? Here are some ways to get rid of a tick that is alive:  Place the tick in rubbing alcohol.  Place the tick in a bag or container you can close tightly.  Wrap the tick tightly in tape.  Flush the tick down the toilet. Contact a doctor if:  You have symptoms of a disease, such as: ? Pain in a muscle, joint, or bone. ? Trouble walking or moving your legs. ? Numbness in your legs. ? Inability to move (paralysis). ? A red rash that makes a circle (bull's-eye rash). ? Redness and swelling where the tick bit you. ? A fever. ? Throwing up (vomiting) over and over. ? Diarrhea. ? Weight loss. ? Tender and swollen lymph glands. ? Shortness of breath. ? Cough. ? Belly pain (abdominal pain). ? Headache. ? Being more tired than normal. ? A change in how alert (conscious) you are. ? Confusion. Get help right away if:  You cannot remove a tick.  A part of a tick breaks off and gets stuck in your skin.  You are feeling worse. Summary  Ticks may carry germs that can make you  sick.  To prevent tick bites, wear long sleeves, long pants, and light colors. Use insect repellent. Follow the instructions on the bottle.  If the tick is biting, do not try to remove it with heat, alcohol, petroleum jelly, or fingernail polish.  Use tweezers, curved forceps, or a tick-removal tool to grasp the tick. Gently pull up until the tick lets go. Do not twist or jerk the tick. Do not squeeze or crush the tick.  If you have symptoms, contact a doctor. This information is not intended to replace advice given to you by your health care provider. Make sure you discuss any questions you have with your health care provider. Document Released: 07/22/2009 Document Revised: 04/11/2018 Document Reviewed: 08/07/2016 Elsevier Patient Education  2020 [employer].

## 2018-11-29 NOTE — Progress Notes (Signed)
Patient ID: Thomas Livingston, male    DOB: 05-02-1951, 68 y.o.   MRN: [id number]  PCP: Susy Frizzle, MD  Chief Complaint  Patient presents with  . Tick Removal    Patient in with tick bite to right upper thigh    Subjective:   Thomas Livingston is a 68 y.o. male, presents to clinic with CC of tick bite with swelling to right upper thigh.  Tick was on him and removed on Sunday, two days ago. He frequently gets ticks and has some local skin reaction to them, he can feel them when they bite him, often the area itches.  This tick was removed intact, skin cleaned, he developed a large area of swelling and mild redness to the area with associated itching.  He has been taking benadryl and applying topical steroids.  It has not changed much in size, itching is mild right now, he has come here to make sure it does not develop into an abscess (at the urging of his wife). No spreading rash, swelling or redness, no nearby swollen lymph nodes. No fever, myalgias, joint pain/swelling, Ha, sore throat.     Patient Active Problem List   Diagnosis Date Noted  . Pain of right forearm 05/19/2018  . Allergy to alpha-gal 05/18/2018  . Asthma, well controlled 03/31/2018  . Other allergic rhinitis 03/31/2018  . Rectal mass 08/27/2014  . Hypertension   . Hyperlipidemia      Prior to Admission medications   Medication Sig Start Date End Date Taking? Authorizing Provider  aspirin 81 MG tablet Take 81 mg by mouth daily.   Yes [provider]  cetirizine (ZYRTEC ALLERGY) 10 MG tablet Take 1 tablet (10 mg total) by mouth 2 (two) times daily. 03/31/18  Yes Garnet Sierras, DO  montelukast (SINGULAIR) 10 MG tablet TAKE 1 TABLET BY MOUTH EVERYDAY AT BEDTIME 05/23/18  Yes Kozlow, Donnamarie Poag, MD  Multiple Vitamin (MULTIVITAMIN) tablet Take 1 tablet by mouth daily.   Yes [provider]  rosuvastatin (CRESTOR) 10 MG tablet TAKE 1 TABLET BY MOUTH EVERY DAY 01/04/18  Yes Pickard, Cammie Mcgee, MD   SYMBICORT 160-4.5 MCG/ACT inhaler TAKE 2 PUFFS BY MOUTH TWICE A DAY 02/28/18  Yes Kozlow, Donnamarie Poag, MD  albuterol (PROVENTIL HFA;VENTOLIN HFA) 108 (90 Base) MCG/ACT inhaler Inhale 1-2 puffs into the lungs every 6 (six) hours as needed for wheezing or shortness of breath. Patient not taking: Reported on 11/29/2018 09/01/17   Jiles Prows, MD  EPINEPHrine 0.3 mg/0.3 mL IJ SOAJ injection Inject 0.3 mLs (0.3 mg total) into the muscle as needed for anaphylaxis. Patient not taking: Reported on 11/29/2018 11/18/18   Susy Frizzle, MD  famotidine (PEPCID) 20 MG tablet Take 1 tablet (20 mg total) by mouth 2 (two) times daily. Patient not taking: Reported on 11/29/2018 03/31/18   Garnet Sierras, DO  famotidine (PEPCID) 40 MG tablet TAKE 1 TABLET BY MOUTH EVERY DAY Patient not taking: Reported on 11/29/2018 04/22/18   Garnet Sierras, DO  fluticasone [facility]) 50 MCG/ACT nasal spray Use 2 sprays in each  nostril daily Patient not taking: Reported on 11/29/2018 01/10/14   Susy Frizzle, MD     Allergies  Allergen Reactions  . Lotrel [Amlodipine Besy-Benazepril Hcl] Hives and Swelling  . Meat [Alpha-Gal]   . Other Swelling    lycra   . Chalk Other (See Comments)    sneezing     Family History  Problem Relation  Age of Onset  . Thyroid cancer Mother   . Lung cancer Father   . Colon cancer Neg Hx      Social History   Socioeconomic History  . Marital status: Married    Spouse name: Not on file  . Number of children: Not on file  . Years of education: Not on file  . Highest education level: Not on file  Occupational History  . Not on file  Social Needs  . Financial resource strain: Not on file  . Food insecurity    Worry: Not on file    Inability: Not on file  . Transportation needs    Medical: Not on file    Non-medical: Not on file  Tobacco Use  . Smoking status: Never Smoker  . Smokeless tobacco: Never Used  Substance and Sexual Activity  . Alcohol use: No  . Drug use: No  .  Sexual activity: Not on file  Lifestyle  . Physical activity    Days per week: Not on file    Minutes per session: Not on file  . Stress: Not on file  Relationships  . Social Herbalist on phone: Not on file    Gets together: Not on file    Attends religious service: Not on file    Active member of club or organization: Not on file    Attends meetings of clubs or organizations: Not on file    Relationship status: Not on file  . Intimate partner violence    Fear of current or ex partner: Not on file    Emotionally abused: Not on file    Physically abused: Not on file    Forced sexual activity: Not on file  Other Topics Concern  . Not on file  Social History Narrative  . Not on file     Review of Systems  Constitutional: Negative.   HENT: Negative.   Eyes: Negative.   Respiratory: Negative.   Cardiovascular: Negative.   Gastrointestinal: Negative.   Endocrine: Negative.   Genitourinary: Negative.   Musculoskeletal: Negative.   Skin: Negative.   Allergic/Immunologic: Negative.   Neurological: Negative.   Hematological: Negative.   Psychiatric/Behavioral: Negative.   All other systems reviewed and are negative.      Objective:    Vitals:   11/29/18 1220  BP: 130/80  Pulse: (!) 57  Resp: 15  Temp: 97.9 F (36.6 C)  TempSrc: Oral  SpO2: 97%  Weight: 228 lb 8 oz (103.6 kg)  Height: 6\' 2"  (1.88 m)      Physical Exam Vitals signs and nursing note reviewed.  Constitutional:      Appearance: He is well-developed.  HENT:     Head: Normocephalic and atraumatic.     Nose: Nose normal.  Eyes:     General:        Right eye: No discharge.        Left eye: No discharge.     Conjunctiva/sclera: Conjunctivae normal.  Neck:     Trachea: No tracheal deviation.  Cardiovascular:     Rate and Rhythm: Normal rate and regular rhythm.  Pulmonary:     Effort: Pulmonary effort is normal. No respiratory distress.     Breath sounds: No stridor.   Musculoskeletal: Normal range of motion.  Skin:    General: Skin is warm and dry.     Findings: Erythema present. No abscess, bruising, ecchymosis or rash.     Comments: Right proximal inner thigh  with 8x 4 cm area of edema with erythema, slightly indurated, no tenderness, no fluctuance, surrounding skin with about 2 cm of erythema w/o edema No warmth  Neurological:     Mental Status: He is alert.     Motor: No abnormal muscle tone.     Coordination: Coordination normal.  Psychiatric:        Behavior: Behavior normal.           Assessment & Plan:      ICD-10-CM   1. Cellulitis of right lower extremity  L03.115    right proximal thigh, suspect local allergic rxn secondary to tick bite 2 days ago, no signs of bacterial infection or abscess, monitor, tx with antihistamines PRN, prednisone rx for any spreading of redness and itching.  Encouraged him to f/up if any signs of infection such as pain, firmness, fluctuance.  Today no constitutional sx, tick removed quickly so highly unlikely to have tick-born illness from this tick bite.       Delsa Grana, PA-C 11/29/18 12:24 PM

## 2019-01-19 DIAGNOSIS — M6740 Ganglion, unspecified site: Secondary | ICD-10-CM | POA: Diagnosis not present

## 2019-01-19 DIAGNOSIS — L565 Disseminated superficial actinic porokeratosis (DSAP): Secondary | ICD-10-CM | POA: Diagnosis not present

## 2019-01-19 DIAGNOSIS — D225 Melanocytic nevi of trunk: Secondary | ICD-10-CM | POA: Diagnosis not present

## 2019-01-19 DIAGNOSIS — L821 Other seborrheic keratosis: Secondary | ICD-10-CM | POA: Diagnosis not present

## 2019-01-24 ENCOUNTER — Ambulatory Visit: Payer: BC Managed Care – PPO | Admitting: Orthopaedic Surgery

## 2019-01-26 ENCOUNTER — Encounter: Payer: Self-pay | Admitting: Orthopaedic Surgery

## 2019-01-26 ENCOUNTER — Ambulatory Visit (INDEPENDENT_AMBULATORY_CARE_PROVIDER_SITE_OTHER): Payer: Medicare Other | Admitting: Orthopaedic Surgery

## 2019-01-26 DIAGNOSIS — M85431 Solitary bone cyst, right ulna and radius: Secondary | ICD-10-CM | POA: Diagnosis not present

## 2019-01-26 DIAGNOSIS — M67442 Ganglion, left hand: Secondary | ICD-10-CM | POA: Insufficient documentation

## 2019-01-26 NOTE — Progress Notes (Signed)
Office Visit Note   Patient: Thomas Livingston           Date of Birth: September 05, 1950           MRN: [id number] Visit Date: 01/26/2019              Requested by: Susy Frizzle, MD [address] [facility] [place],  [place] [address] PCP: Susy Frizzle, MD   Assessment & Plan: Visit Diagnoses:  1. Solitary bone cyst of right forearm   2. Ganglion cyst of flexor tendon sheath of finger of left hand     Plan: Impression is right forearm mass and left ring finger ganglion cyst of flexor tendon sheath.  We will obtain MRI of the right forearm to better evaluate the mass as I was not able to aspirate any fluid from this.  Therefore I want to rule out malignancy.  I will be in touch with the patient regarding the results of the MRI.  We will schedule surgery for excision of both masses after the MRI  Follow-Up Instructions: Return if symptoms worsen or fail to improve.   Orders:  Orders Placed This Encounter  Procedures  . MR FOREARM RIGHT W CONTRAST   No orders of the defined types were placed in this encounter.     Procedures: No procedures performed   Clinical Data: No additional findings.   Subjective: Chief Complaint  Patient presents with  . Left Hand - Cyst  . Arm Problem    ganglion cyst    [name] returns today for evaluation of his right forearm cyst as well as a newly developed left ring finger cyst.  He states that both cysts are very bothersome and he is having difficulty with use of his left hand and his right forearm especially when they are bumped.  He is interested in having these removed.   Review of Systems  Constitutional: Negative.   All other systems reviewed and are negative.    Objective: Vital Signs: Ht 6\' 2"  (1.88 m)   Wt 228 lb (103.4 kg)   BMI 29.27 kg/m   Physical Exam Vitals signs and nursing note reviewed.  Constitutional:      Appearance: He is well-developed.  Pulmonary:     Effort: Pulmonary effort is normal.   Abdominal:     Palpations: Abdomen is soft.  Skin:    General: Skin is warm.  Neurological:     Mental Status: He is alert and oriented to person, place, and time.  Psychiatric:        Behavior: Behavior normal.        Thought Content: Thought content normal.        Judgment: Judgment normal.     Ortho Exam Right forearm shows a firm mass on the dorsum.  There are no overlying skin changes.  No neurovascular compromise. Left ring finger shows a palpable cystic lesion and the finger palmar flexion crease.  This is tender to palpation. Specialty Comments:  No specialty comments available.  Imaging: No results found.   PMFS History: Patient Active Problem List   Diagnosis Date Noted  . Solitary bone cyst of right forearm 01/26/2019  . Ganglion cyst of flexor tendon sheath of finger of left hand 01/26/2019  . Pain of right forearm 05/19/2018  . Allergy to alpha-gal 05/18/2018  . Asthma, well controlled 03/31/2018  . Other allergic rhinitis 03/31/2018  . Rectal mass 08/27/2014  . Hypertension   . Hyperlipidemia  Past Medical History:  Diagnosis Date  . Allergy   . Arthritis    left hip  . Asthma   . History of kidney stones   . Hyperlipidemia   . Hypertension 2012   no bp meds since 2012  . Multiple lung nodules on CT 2007   no current follow up done  . Psoriasis    both legs  . Urticaria   . Urticaria 03/31/2018    Family History  Problem Relation Age of Onset  . Thyroid cancer Mother   . Lung cancer Father   . Colon cancer Neg Hx     Past Surgical History:  Procedure Laterality Date  . CHOLECYSTECTOMY  2007  . EUS N/A 06/28/2014   Procedure: LOWER ENDOSCOPIC ULTRASOUND (EUS);  Surgeon: Milus Banister, MD;  Location: [facility];  Service: Endoscopy;  Laterality: N/A;  . EXTRACORPOREAL SHOCK WAVE LITHOTRIPSY N/A 03/21/2018   Procedure: EXTRACORPOREAL SHOCK WAVE LITHOTRIPSY (ESWL);  Surgeon: Irine Seal, MD;  Location: [facility];  Service: Urology;   Laterality: N/A;  . HIP ARTHROPLASTY Left 2018  . [place]  . PROCTOSCOPY N/A 08/27/2014   Procedure: PROCTOSCOPY;  Surgeon: Leighton Ruff, MD;  Location: [facility];  Service: General;  Laterality: N/A;  . TRANSANAL RECTAL RESECTION N/A 08/27/2014   Procedure: TRANSANAL RESECTION OF RECTAL TUMOR;  Surgeon: Leighton Ruff, MD;  Location: [facility];  Service: General;  Laterality: N/A;   Social History   Occupational History  . Not on file  Tobacco Use  . Smoking status: Never Smoker  . Smokeless tobacco: Never Used  Substance and Sexual Activity  . Alcohol use: No  . Drug use: No  . Sexual activity: Not on file

## 2019-02-22 ENCOUNTER — Ambulatory Visit
Admission: RE | Admit: 2019-02-22 | Discharge: 2019-02-22 | Disposition: A | Discharge status: Home / Self Care | Payer: Medicare Other | Source: Ambulatory Visit | Attending: Orthopaedic Surgery | Admitting: Orthopaedic Surgery

## 2019-02-22 DIAGNOSIS — R2231 Localized swelling, mass and lump, right upper limb: Secondary | ICD-10-CM | POA: Diagnosis not present

## 2019-02-22 DIAGNOSIS — M85431 Solitary bone cyst, right ulna and radius: Secondary | ICD-10-CM

## 2019-02-22 MED ORDER — GADOBENATE DIMEGLUMINE 529 MG/ML IV SOLN
20.0000 mL | Freq: Once | INTRAVENOUS | Status: AC | PRN
  Administered 2019-02-22: 20 mL via INTRAVENOUS

## 2019-02-23 ENCOUNTER — Other Ambulatory Visit: Payer: Self-pay

## 2019-02-23 DIAGNOSIS — M25532 Pain in left wrist: Secondary | ICD-10-CM

## 2019-02-23 NOTE — Progress Notes (Signed)
Needs referral to Dr. Magda Bernheim at [facility] ASAP for neoplasm of left wrist.  I will call patient later today.

## 2019-02-28 ENCOUNTER — Ambulatory Visit: Payer: Medicare Other | Admitting: Orthopaedic Surgery

## 2019-02-28 DIAGNOSIS — D2111 Benign neoplasm of connective and other soft tissue of right upper limb, including shoulder: Secondary | ICD-10-CM | POA: Diagnosis not present

## 2019-03-01 DIAGNOSIS — Z0120 Encounter for dental examination and cleaning without abnormal findings: Secondary | ICD-10-CM | POA: Diagnosis not present

## 2019-03-20 ENCOUNTER — Other Ambulatory Visit: Payer: Self-pay | Admitting: Family Medicine

## 2019-03-27 DIAGNOSIS — Z20828 Contact with and (suspected) exposure to other viral communicable diseases: Secondary | ICD-10-CM | POA: Diagnosis not present

## 2019-03-27 DIAGNOSIS — Z01812 Encounter for preprocedural laboratory examination: Secondary | ICD-10-CM | POA: Diagnosis not present

## 2019-03-31 DIAGNOSIS — L720 Epidermal cyst: Secondary | ICD-10-CM | POA: Diagnosis not present

## 2019-03-31 DIAGNOSIS — D2111 Benign neoplasm of connective and other soft tissue of right upper limb, including shoulder: Secondary | ICD-10-CM | POA: Diagnosis not present

## 2019-04-12 DIAGNOSIS — R3911 Hesitancy of micturition: Secondary | ICD-10-CM | POA: Diagnosis not present

## 2019-04-12 DIAGNOSIS — R109 Unspecified abdominal pain: Secondary | ICD-10-CM | POA: Diagnosis not present

## 2019-04-12 DIAGNOSIS — N401 Enlarged prostate with lower urinary tract symptoms: Secondary | ICD-10-CM | POA: Diagnosis not present

## 2019-04-12 DIAGNOSIS — R351 Nocturia: Secondary | ICD-10-CM | POA: Diagnosis not present

## 2019-04-18 DIAGNOSIS — Z4889 Encounter for other specified surgical aftercare: Secondary | ICD-10-CM | POA: Diagnosis not present

## 2019-05-03 DIAGNOSIS — Z0120 Encounter for dental examination and cleaning without abnormal findings: Secondary | ICD-10-CM | POA: Diagnosis not present

## 2019-05-08 IMAGING — CR DG LUMBAR SPINE COMPLETE 4+V
5 series · 5 of 5 positions shown · non-contrast
Comparison: CT 03/12/2010.

CLINICAL DATA: Low back pain.  No injury.

EXAM:
LUMBAR SPINE - COMPLETE 4+ VIEW

[t l-spine a.p.]
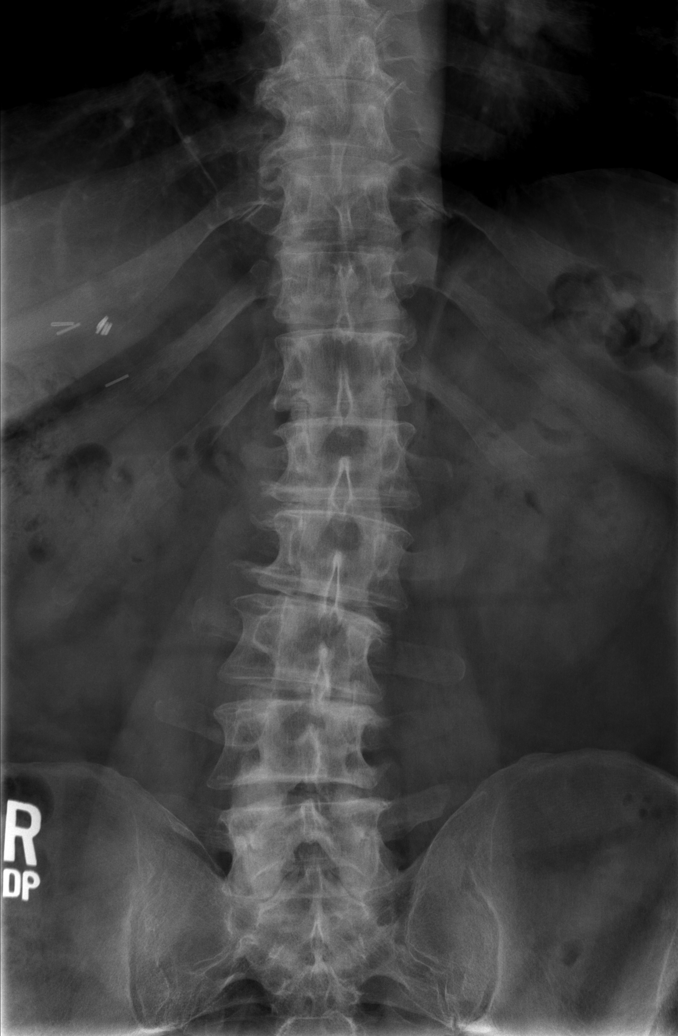

[t l-spine oblique exposure (1 of 2)]
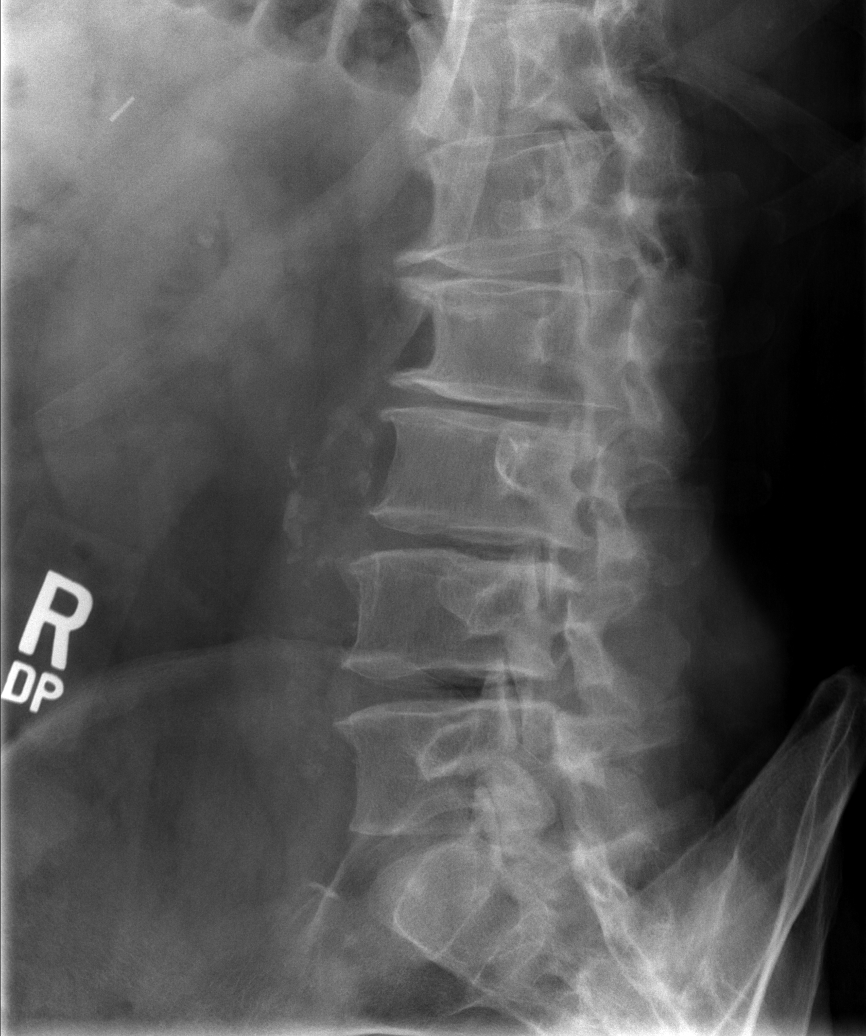

[t l-spine oblique exposure (2 of 2)]
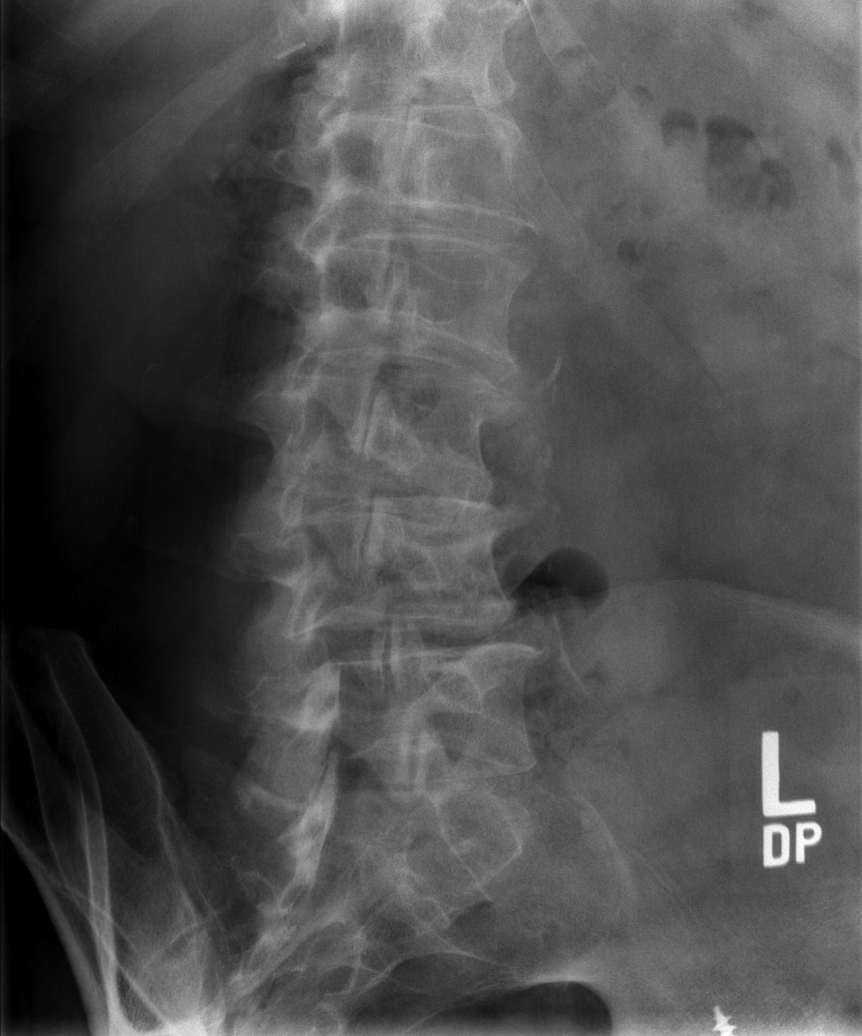

[t l-spine lat]
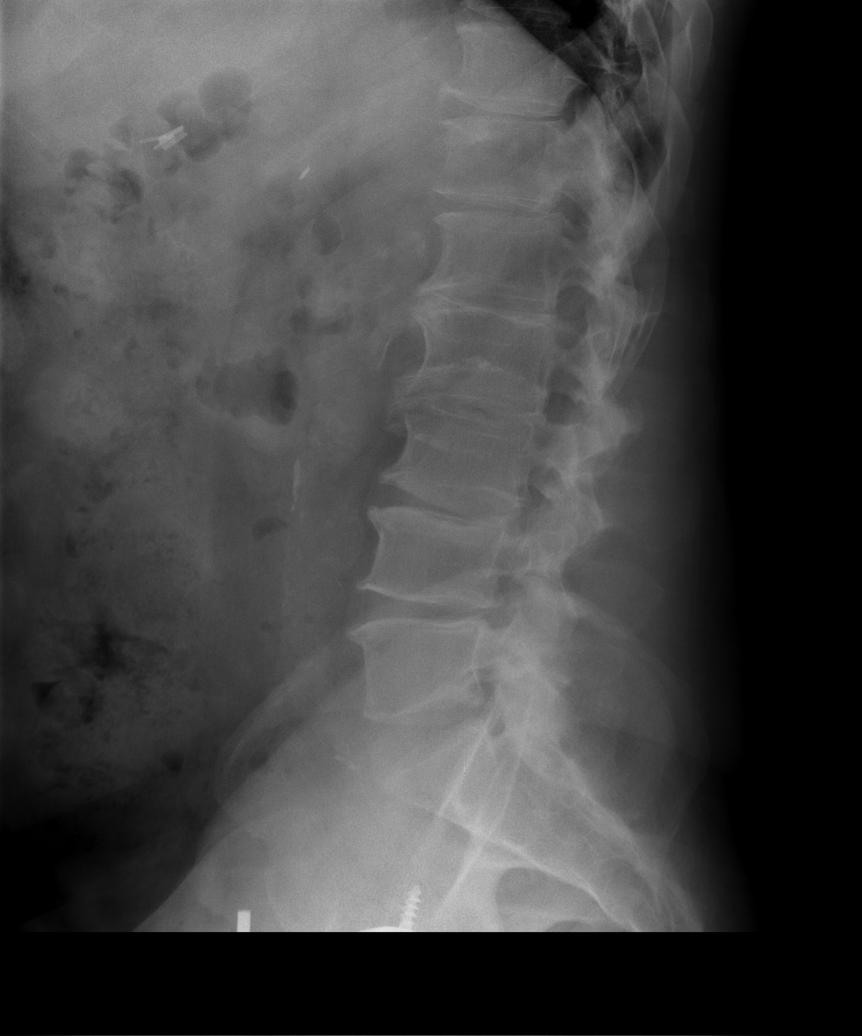

[t l-spine l5-s1 spot]
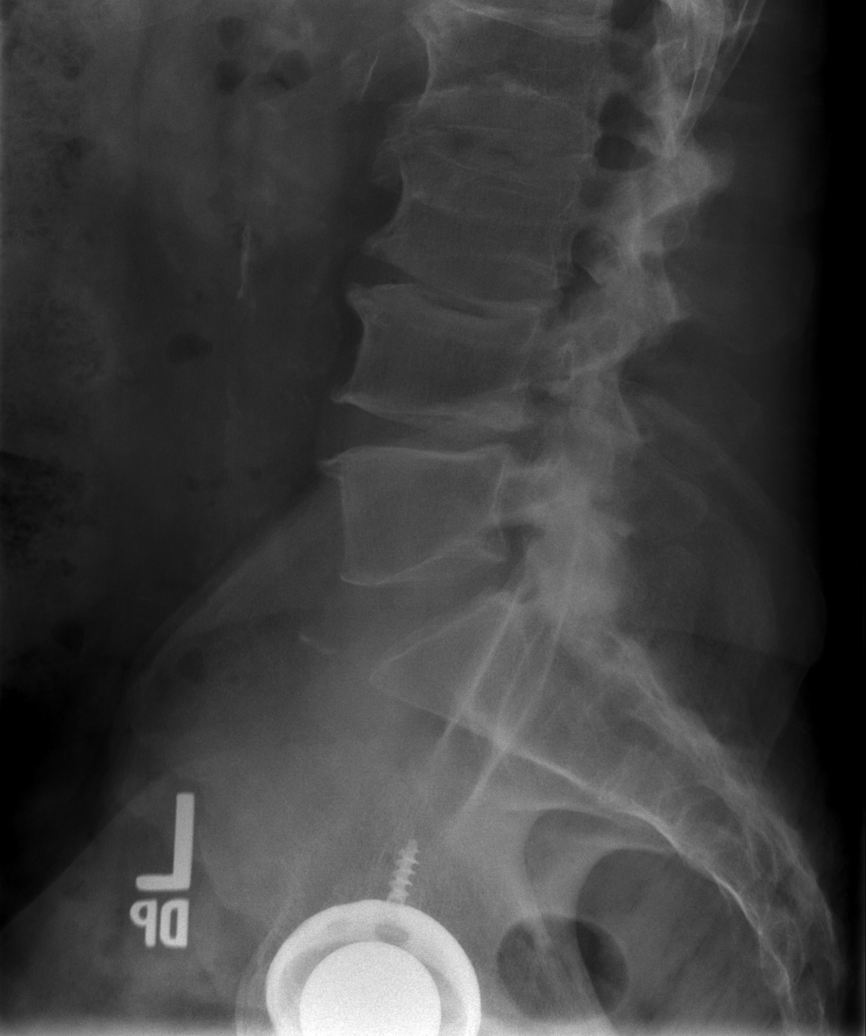

[5 of 5 positions shown; findings below may reference images not displayed]

FINDINGS: Thoracolumbar spine scoliosis. No acute bony abnormality identified.
No evidence of fracture. Aortic atherosclerotic vascular
calcification .
IMPRESSION: Thoracolumbar spine scoliosis. No acute abnormality . Aortic
atherosclerotic vascular disease.

## 2019-05-15 ENCOUNTER — Other Ambulatory Visit: Payer: Medicare Other

## 2019-05-15 ENCOUNTER — Other Ambulatory Visit: Payer: Self-pay

## 2019-05-15 DIAGNOSIS — Z0000 Encounter for general adult medical examination without abnormal findings: Secondary | ICD-10-CM | POA: Diagnosis not present

## 2019-05-15 DIAGNOSIS — Z0120 Encounter for dental examination and cleaning without abnormal findings: Secondary | ICD-10-CM | POA: Diagnosis not present

## 2019-05-16 LAB — COMPREHENSIVE METABOLIC PANEL
AG Ratio: 1.5 (calc) (ref 1.0–2.5)
ALT: 19 U/L (ref 9–46)
AST: 23 U/L (ref 10–35)
Albumin: 3.7 g/dL (ref 3.6–5.1)
Alkaline phosphatase (APISO): 67 U/L (ref 35–144)
BUN: 12 mg/dL (ref 7–25)
CO2: 30 mmol/L (ref 20–32)
Calcium: 9 mg/dL (ref 8.6–10.3)
Chloride: 105 mmol/L (ref 98–110)
Creat: 0.76 mg/dL (ref 0.70–1.25)
Globulin: 2.5 g/dL (calc) (ref 1.9–3.7)
Glucose, Bld: 88 mg/dL (ref 65–99)
Potassium: 4.8 mmol/L (ref 3.5–5.3)
Sodium: 140 mmol/L (ref 135–146)
Total Bilirubin: 0.6 mg/dL (ref 0.2–1.2)
Total Protein: 6.2 g/dL (ref 6.1–8.1)

## 2019-05-16 LAB — CBC WITH DIFFERENTIAL/PLATELET
Absolute Monocytes: 725 cells/uL (ref 200–950)
Basophils Absolute: 62 cells/uL (ref 0–200)
Basophils Relative: 0.9 %
Eosinophils Absolute: 794 cells/uL — ABNORMAL HIGH (ref 15–500)
Eosinophils Relative: 11.5 %
HCT: 43.5 % (ref 38.5–50.0)
Hemoglobin: 14.8 g/dL (ref 13.2–17.1)
Lymphs Abs: 835 cells/uL — ABNORMAL LOW (ref 850–3900)
MCH: 31.1 pg (ref 27.0–33.0)
MCHC: 34 g/dL (ref 32.0–36.0)
MCV: 91.4 fL (ref 80.0–100.0)
MPV: 11.3 fL (ref 7.5–12.5)
Monocytes Relative: 10.5 %
Neutro Abs: 4485 cells/uL (ref 1500–7800)
Neutrophils Relative %: 65 %
Platelets: 227 10*3/uL (ref 140–400)
RBC: 4.76 10*6/uL (ref 4.20–5.80)
RDW: 12.1 % (ref 11.0–15.0)
Total Lymphocyte: 12.1 %
WBC: 6.9 10*3/uL (ref 3.8–10.8)

## 2019-05-16 LAB — LIPID PANEL
Cholesterol: 134 mg/dL (ref <200)
HDL: 37 mg/dL — ABNORMAL LOW (ref >=40)
LDL Cholesterol (Calc): 83 mg/dL (calc)
Non-HDL Cholesterol (Calc): 97 mg/dL (calc) (ref <130)
Total CHOL/HDL Ratio: 3.6 (calc) (ref <5.0)
Triglycerides: 61 mg/dL (ref <150)

## 2019-05-16 LAB — PSA: PSA: 0.7 ng/mL (ref <=4.0)

## 2019-05-17 DIAGNOSIS — Z87442 Personal history of urinary calculi: Secondary | ICD-10-CM | POA: Diagnosis not present

## 2019-05-17 DIAGNOSIS — R351 Nocturia: Secondary | ICD-10-CM | POA: Diagnosis not present

## 2019-05-17 DIAGNOSIS — N401 Enlarged prostate with lower urinary tract symptoms: Secondary | ICD-10-CM | POA: Diagnosis not present

## 2019-05-18 ENCOUNTER — Ambulatory Visit (INDEPENDENT_AMBULATORY_CARE_PROVIDER_SITE_OTHER): Payer: Medicare Other | Admitting: Family Medicine

## 2019-05-18 ENCOUNTER — Encounter: Payer: Self-pay | Admitting: Family Medicine

## 2019-05-18 ENCOUNTER — Other Ambulatory Visit: Payer: Self-pay

## 2019-05-18 VITALS — BP 122/74 | HR 70 | Temp 97.7°F | Resp 16 | Ht 74.0 in | Wt 226.0 lb

## 2019-05-18 DIAGNOSIS — Z0000 Encounter for general adult medical examination without abnormal findings: Secondary | ICD-10-CM

## 2019-05-18 DIAGNOSIS — Z23 Encounter for immunization: Secondary | ICD-10-CM

## 2019-05-18 DIAGNOSIS — Z125 Encounter for screening for malignant neoplasm of prostate: Secondary | ICD-10-CM | POA: Diagnosis not present

## 2019-05-18 DIAGNOSIS — E7800 Pure hypercholesterolemia, unspecified: Secondary | ICD-10-CM | POA: Diagnosis not present

## 2019-05-18 NOTE — Progress Notes (Signed)
Subjective:    Patient ID: Thomas Livingston, male    DOB: 01-23-1951, 69 y.o.   MRN: [id number]  HPI Patient is a very pleasant 69 year old Caucasian male here today for complete physical exam.  He received Prevnar 13 last year.  He is due for his annual flu shot.  He is also due for Pneumovax 23.  He agrees to receive his flu shot but he prefers to defer Pneumovax 23 for now.  He has had Zostavax in the past.  He is also due for Shingrix.  Tetanus shot is up-to-date.  Most recent PSA on his lab work was 0.7 and is virtually undetectable.  Colonoscopy is up-to-date.  He denies any issues with falls, depression, or memory loss.  Overall he is doing well with no concerns. Past Medical History:  Diagnosis Date  . Allergy   . Arthritis    left hip  . Asthma   . History of kidney stones   . Hyperlipidemia   . Hypertension 2012   no bp meds since 2012  . Multiple lung nodules on CT 2007   no current follow up done  . Psoriasis    both legs  . Urticaria   . Urticaria 03/31/2018   Past Surgical History:  Procedure Laterality Date  . CHOLECYSTECTOMY  2007  . EUS N/A 06/28/2014   Procedure: LOWER ENDOSCOPIC ULTRASOUND (EUS);  Surgeon: Milus Banister, MD;  Location: [facility];  Service: Endoscopy;  Laterality: N/A;  . EXTRACORPOREAL SHOCK WAVE LITHOTRIPSY N/A 03/21/2018   Procedure: EXTRACORPOREAL SHOCK WAVE LITHOTRIPSY (ESWL);  Surgeon: Irine Seal, MD;  Location: [facility];  Service: Urology;  Laterality: N/A;  . HIP ARTHROPLASTY Left 2018  . [place]  . PROCTOSCOPY N/A 08/27/2014   Procedure: PROCTOSCOPY;  Surgeon: Leighton Ruff, MD;  Location: [facility];  Service: General;  Laterality: N/A;  . TRANSANAL RECTAL RESECTION N/A 08/27/2014   Procedure: TRANSANAL RESECTION OF RECTAL TUMOR;  Surgeon: Leighton Ruff, MD;  Location: [facility];  Service: General;  Laterality: N/A;   Current Outpatient Medications on File Prior to Visit  Medication Sig Dispense Refill  . albuterol (PROVENTIL  HFA;VENTOLIN HFA) 108 (90 Base) MCG/ACT inhaler Inhale 1-2 puffs into the lungs every 6 (six) hours as needed for wheezing or shortness of breath. 18 g 1  . aspirin 81 MG tablet Take 81 mg by mouth daily.    . cetirizine (ZYRTEC ALLERGY) 10 MG tablet Take 1 tablet (10 mg total) by mouth 2 (two) times daily. 60 tablet 3  . EPINEPHrine 0.3 mg/0.3 mL IJ SOAJ injection Inject 0.3 mLs (0.3 mg total) into the muscle as needed for anaphylaxis. 2 each 1  . famotidine (PEPCID) 40 MG tablet TAKE 1 TABLET BY MOUTH EVERY DAY 30 tablet 0  . fluticasone (FLONASE) 50 MCG/ACT nasal spray Use 2 sprays in each  nostril daily 48 g 3  . montelukast (SINGULAIR) 10 MG tablet TAKE 1 TABLET BY MOUTH EVERYDAY AT BEDTIME 90 tablet 0  . Multiple Vitamin (MULTIVITAMIN) tablet Take 1 tablet by mouth daily.    . rosuvastatin (CRESTOR) 10 MG tablet TAKE 1 TABLET BY MOUTH EVERY DAY 90 tablet 3  . SYMBICORT 160-4.5 MCG/ACT inhaler TAKE 2 PUFFS BY MOUTH TWICE A DAY 30.6 Inhaler 0  . tamsulosin (FLOMAX) 0.4 MG CAPS capsule Take 0.4 mg by mouth daily.     No current facility-administered medications on file prior to visit.   Allergies  Allergen Reactions  . Lotrel [Amlodipine  Besy-Benazepril Hcl] Hives and Swelling  . Meat [Alpha-Gal]   . Other Swelling    lycra   . Chalk Other (See Comments)    sneezing   Social History   Socioeconomic History  . Marital status: Married    Spouse name: Not on file  . Number of children: Not on file  . Years of education: Not on file  . Highest education level: Not on file  Occupational History  . Not on file  Tobacco Use  . Smoking status: Never Smoker  . Smokeless tobacco: Never Used  Substance and Sexual Activity  . Alcohol use: No  . Drug use: No  . Sexual activity: Not on file  Other Topics Concern  . Not on file  Social History Narrative  . Not on file   Social Determinants of Health   Financial Resource Strain:   . Difficulty of Paying Living Expenses: Not on file   Food Insecurity:   . Worried About Charity fundraiser in the Last Year: Not on file  . Ran Out of Food in the Last Year: Not on file  Transportation Needs:   . Lack of Transportation (Medical): Not on file  . Lack of Transportation (Non-Medical): Not on file  Physical Activity:   . Days of Exercise per Week: Not on file  . Minutes of Exercise per Session: Not on file  Stress:   . Feeling of Stress : Not on file  Social Connections:   . Frequency of Communication with Friends and Family: Not on file  . Frequency of Social Gatherings with Friends and Family: Not on file  . Attends Religious Services: Not on file  . Active Member of Clubs or Organizations: Not on file  . Attends Archivist Meetings: Not on file  . Marital Status: Not on file  Intimate Partner Violence:   . Fear of Current or Ex-Partner: Not on file  . Emotionally Abused: Not on file  . Physically Abused: Not on file  . Sexually Abused: Not on file   Family History  Problem Relation Age of Onset  . Thyroid cancer Mother   . Lung cancer Father   . Colon cancer Neg Hx       Review of Systems  All other systems reviewed and are negative.      Objective:   Physical Exam  Constitutional: He is oriented to person, place, and time. He appears well-developed and well-nourished. No distress.  HENT:  Head: Normocephalic and atraumatic.  Right Ear: External ear normal.  Left Ear: External ear normal.  Nose: Nose normal.  Mouth/Throat: Oropharynx is clear and moist. No oropharyngeal exudate.  Eyes: Pupils are equal, round, and reactive to light. Conjunctivae and EOM are normal. Right eye exhibits no discharge. Left eye exhibits no discharge. No scleral icterus.  Neck: No JVD present. No tracheal deviation present. No thyromegaly present.  Cardiovascular: Normal rate, regular rhythm, normal heart sounds and intact distal pulses. Exam reveals no gallop and no friction rub.  No murmur  heard. Pulmonary/Chest: Effort normal and breath sounds normal. No stridor. No respiratory distress. He has no wheezes. He has no rales. He exhibits no tenderness.  Abdominal: Soft. Bowel sounds are normal. He exhibits no distension and no mass. There is no abdominal tenderness. There is no rebound and no guarding.  Musculoskeletal:        General: No tenderness, deformity or edema. Normal range of motion.     Cervical back: Normal range of  motion and neck supple.  Lymphadenopathy:    He has no cervical adenopathy.  Neurological: He is alert and oriented to person, place, and time. He has normal reflexes. No cranial nerve deficit. He exhibits normal muscle tone. Coordination normal.  Skin: Skin is warm. No rash noted. He is not diaphoretic. No erythema. No pallor.  Psychiatric: He has a normal mood and affect. His behavior is normal. Judgment and thought content normal.  Vitals reviewed.         Assessment & Plan:  General medical exam  Prostate cancer screening  Pure hypercholesterolemia  I reviewed the patient's lab work with him.  Lab work is exceptional except for HDL cholesterol 37.  However his PSA is excellent.  His CMP is normal.  His CBC is normal.  His LDL cholesterol is outstanding.  Prostate cancer screening is therefore up-to-date.  Colon cancer screening is up-to-date.  Patient received his flu shot.  He deferred his Pneumovax 23 for the present time.  He denies any issues with falls, depression, or memory loss.

## 2019-05-18 NOTE — Addendum Note (Signed)
Addended by: Shary Decamp B on: 05/18/2019 11:11 AM   Modules accepted: Orders

## 2019-07-14 ENCOUNTER — Other Ambulatory Visit: Payer: Self-pay

## 2019-07-14 ENCOUNTER — Ambulatory Visit: Payer: Medicare Other | Attending: Internal Medicine

## 2019-07-14 DIAGNOSIS — Z23 Encounter for immunization: Secondary | ICD-10-CM

## 2019-07-14 NOTE — Progress Notes (Signed)
   Covid-19 Vaccination Clinic  Name:  Thomas Livingston    MRN: [id number] DOB: [birth year]  07/14/2019  [name] was observed post Covid-19 immunization for 15 minutes without incident. He was provided with Vaccine Information Sheet and instruction to access the V-Safe system.   [name] was instructed to call 911 with any severe reactions post vaccine: Marland Kitchen Difficulty breathing  . Swelling of face and throat  . A fast heartbeat  . A bad rash all over body  . Dizziness and weakness   Immunizations Administered    Name Date Dose VIS Date Route   Pfizer COVID-19 Vaccine 07/14/2019 10:16 AM 0.3 mL 04/21/2019 Intramuscular   Manufacturer: [place]   Lot: [id number]   [place]: [id number]

## 2019-08-09 ENCOUNTER — Ambulatory Visit: Payer: Medicare Other | Attending: Internal Medicine

## 2019-08-09 DIAGNOSIS — Z23 Encounter for immunization: Secondary | ICD-10-CM

## 2019-08-09 NOTE — Progress Notes (Signed)
   Covid-19 Vaccination Clinic  Name:  Thomas Livingston    MRN: [id number] DOB: 10-May-1951  08/09/2019  Mr. [name] was observed post Covid-19 immunization for 15 minutes without incident. He was provided with Vaccine Information Sheet and instruction to access the V-Safe system.   Mr. [name] was instructed to call 911 with any severe reactions post vaccine: Marland Kitchen Difficulty breathing  . Swelling of face and throat  . A fast heartbeat  . A bad rash all over body  . Dizziness and weakness   Immunizations Administered    Name Date Dose VIS Date Route   Pfizer COVID-19 Vaccine 08/09/2019  9:38 AM 0.3 mL 04/21/2019 Intramuscular   Manufacturer: [place]   Lot: [id number]   [place]: [id number]

## 2019-09-04 ENCOUNTER — Encounter: Payer: Self-pay | Admitting: Family Medicine

## 2019-09-04 ENCOUNTER — Ambulatory Visit (INDEPENDENT_AMBULATORY_CARE_PROVIDER_SITE_OTHER): Payer: Medicare Other | Admitting: Family Medicine

## 2019-09-04 ENCOUNTER — Other Ambulatory Visit: Payer: Self-pay

## 2019-09-04 VITALS — BP 140/76 | HR 70 | Temp 96.7°F | Resp 18 | Ht 74.0 in | Wt 232.0 lb

## 2019-09-04 DIAGNOSIS — K644 Residual hemorrhoidal skin tags: Secondary | ICD-10-CM

## 2019-09-04 MED ORDER — HYDROCORTISONE (PERIANAL) 2.5 % EX CREA: 1.0000 "application " | TOPICAL_CREAM | Freq: Two times a day (BID) | CUTANEOUS | 0 refills | Status: DC

## 2019-09-04 MED ORDER — HYDROCORTISONE 2.5 % EX CREA: TOPICAL_CREAM | Freq: Two times a day (BID) | CUTANEOUS | 0 refills | Status: DC

## 2019-09-04 NOTE — Progress Notes (Signed)
Subjective:    Patient ID: Thomas Livingston, male    DOB: 1950-10-12, 69 y.o.   MRN: [id number]  HPI Last week, the patient developed a hemorrhoid.  He states that it itches and burns and bleeds.  He has been using Preparation H without success.  He is here today for further evaluation. Past Medical History:  Diagnosis Date  . Allergy   . Arthritis    left hip  . Asthma   . History of kidney stones   . Hyperlipidemia   . Hypertension 2012   no bp meds since 2012  . Multiple lung nodules on CT 2007   no current follow up done  . Psoriasis    both legs  . Urticaria   . Urticaria 03/31/2018   Past Surgical History:  Procedure Laterality Date  . CHOLECYSTECTOMY  2007  . EUS N/A 06/28/2014   Procedure: LOWER ENDOSCOPIC ULTRASOUND (EUS);  Surgeon: Milus Banister, MD;  Location: [facility];  Service: Endoscopy;  Laterality: N/A;  . EXTRACORPOREAL SHOCK WAVE LITHOTRIPSY N/A 03/21/2018   Procedure: EXTRACORPOREAL SHOCK WAVE LITHOTRIPSY (ESWL);  Surgeon: Irine Seal, MD;  Location: [facility];  Service: Urology;  Laterality: N/A;  . HIP ARTHROPLASTY Left 2018  . [place]  . PROCTOSCOPY N/A 08/27/2014   Procedure: PROCTOSCOPY;  Surgeon: Leighton Ruff, MD;  Location: [facility];  Service: General;  Laterality: N/A;  . TRANSANAL RECTAL RESECTION N/A 08/27/2014   Procedure: TRANSANAL RESECTION OF RECTAL TUMOR;  Surgeon: Leighton Ruff, MD;  Location: [facility];  Service: General;  Laterality: N/A;   Current Outpatient Medications on File Prior to Visit  Medication Sig Dispense Refill  . albuterol (PROVENTIL HFA;VENTOLIN HFA) 108 (90 Base) MCG/ACT inhaler Inhale 1-2 puffs into the lungs every 6 (six) hours as needed for wheezing or shortness of breath. 18 g 1  . aspirin 81 MG tablet Take 81 mg by mouth daily.    . cetirizine (ZYRTEC ALLERGY) 10 MG tablet Take 1 tablet (10 mg total) by mouth 2 (two) times daily. 60 tablet 3  . EPINEPHrine 0.3 mg/0.3 mL IJ SOAJ injection Inject 0.3 mLs (0.3  mg total) into the muscle as needed for anaphylaxis. 2 each 1  . famotidine (PEPCID) 40 MG tablet TAKE 1 TABLET BY MOUTH EVERY DAY 30 tablet 0  . fluticasone (FLONASE) 50 MCG/ACT nasal spray Use 2 sprays in each  nostril daily 48 g 3  . montelukast (SINGULAIR) 10 MG tablet TAKE 1 TABLET BY MOUTH EVERYDAY AT BEDTIME 90 tablet 0  . Multiple Vitamin (MULTIVITAMIN) tablet Take 1 tablet by mouth daily.    . rosuvastatin (CRESTOR) 10 MG tablet TAKE 1 TABLET BY MOUTH EVERY DAY 90 tablet 3  . SYMBICORT 160-4.5 MCG/ACT inhaler TAKE 2 PUFFS BY MOUTH TWICE A DAY 30.6 Inhaler 0  . tamsulosin (FLOMAX) 0.4 MG CAPS capsule Take 0.4 mg by mouth daily.     No current facility-administered medications on file prior to visit.   Allergies  Allergen Reactions  . Lotrel [Amlodipine Besy-Benazepril Hcl] Hives and Swelling  . Meat [Alpha-Gal]   . Other Swelling    lycra   . Chalk Other (See Comments)    sneezing   Social History   Socioeconomic History  . Marital status: Married    Spouse name: Not on file  . Number of children: Not on file  . Years of education: Not on file  . Highest education level: Not on file  Occupational History  .  Not on file  Tobacco Use  . Smoking status: Never Smoker  . Smokeless tobacco: Never Used  Substance and Sexual Activity  . Alcohol use: No  . Drug use: No  . Sexual activity: Not on file  Other Topics Concern  . Not on file  Social History Narrative  . Not on file   Social Determinants of Health   Financial Resource Strain:   . Difficulty of Paying Living Expenses:   Food Insecurity:   . Worried About Charity fundraiser in the Last Year:   . Arboriculturist in the Last Year:   Transportation Needs:   . Film/video editor (Medical):   Marland Kitchen Lack of Transportation (Non-Medical):   Physical Activity:   . Days of Exercise per Week:   . Minutes of Exercise per Session:   Stress:   . Feeling of Stress :   Social Connections:   . Frequency of  Communication with Friends and Family:   . Frequency of Social Gatherings with Friends and Family:   . Attends Religious Services:   . Active Member of Clubs or Organizations:   . Attends Archivist Meetings:   Marland Kitchen Marital Status:   Intimate Partner Violence:   . Fear of Current or Ex-Partner:   . Emotionally Abused:   Marland Kitchen Physically Abused:   . Sexually Abused:      Review of Systems  All other systems reviewed and are negative.      Objective:   Physical Exam Vitals reviewed.  Constitutional:      Appearance: Normal appearance. He is normal weight.  Cardiovascular:     Rate and Rhythm: Normal rate and regular rhythm.  Pulmonary:     Effort: Pulmonary effort is normal.     Breath sounds: Normal breath sounds.  Genitourinary:    Rectum: Tenderness and external hemorrhoid present.    Neurological:     Mental Status: He is alert.           Assessment & Plan:  External hemorrhoid  3 cm external hemorrhoid.  Use Anusol HC 2.5% cream twice daily for 7 to 10 days.

## 2019-09-25 DIAGNOSIS — M1611 Unilateral primary osteoarthritis, right hip: Secondary | ICD-10-CM | POA: Diagnosis not present

## 2019-10-04 DIAGNOSIS — M1611 Unilateral primary osteoarthritis, right hip: Secondary | ICD-10-CM | POA: Diagnosis not present

## 2019-11-02 ENCOUNTER — Other Ambulatory Visit: Payer: Self-pay

## 2019-11-02 ENCOUNTER — Encounter: Payer: Self-pay | Admitting: Nurse Practitioner

## 2019-11-02 ENCOUNTER — Ambulatory Visit (INDEPENDENT_AMBULATORY_CARE_PROVIDER_SITE_OTHER): Payer: Medicare Other | Admitting: Nurse Practitioner

## 2019-11-02 VITALS — BP 120/82 | HR 54 | Temp 97.6°F | Resp 18 | Wt 229.4 lb

## 2019-11-02 DIAGNOSIS — H6692 Otitis media, unspecified, left ear: Secondary | ICD-10-CM

## 2019-11-02 MED ORDER — AMOXICILLIN-POT CLAVULANATE 875-125 MG PO TABS: 1.0000 | ORAL_TABLET | Freq: Two times a day (BID) | ORAL | 0 refills | Status: DC

## 2019-11-02 NOTE — Progress Notes (Signed)
Established Patient Office Visit  Subjective:  Patient ID: DESMOND SZABO, male    DOB: 03/25/1951  Age: 69 y.o. MRN: [id number]  CC:  Chief Complaint  Patient presents with  . Otitis Media    post nasal drip, ear pain, started 06/23  . Tick Removal    tick bite under L breast had a target around bite,got bitten on 06/21     HPI Thomas Livingston is a 69 year old male presenting for bilateral ear pressure and pain that started about a week ago and has worsened over the past 2-3 days. He has a h/o ear infections over his lifetime and his present sxs are similar to the past sxs of ear infections. He reports music and sound vibrate in his ear and are distorted. He has tried his usual over the counter pain relief but the sxs have persisted. The sxs did start just after nasal congestion and drainage sxs a week prior that did resolve.   No fever, chills, headache, ear drainage, other with similar sxs. No sore throat, loss of taste, smell, appetite.   Past Medical History:  Diagnosis Date  . Allergy   . Arthritis    left hip  . Asthma   . History of kidney stones   . Hyperlipidemia   . Hypertension 2012   no bp meds since 2012  . Multiple lung nodules on CT 2007   no current follow up done  . Psoriasis    both legs  . Urticaria   . Urticaria 03/31/2018    Past Surgical History:  Procedure Laterality Date  . CHOLECYSTECTOMY  2007  . EUS N/A 06/28/2014   Procedure: LOWER ENDOSCOPIC ULTRASOUND (EUS);  Surgeon: Milus Banister, MD;  Location: [facility];  Service: Endoscopy;  Laterality: N/A;  . EXTRACORPOREAL SHOCK WAVE LITHOTRIPSY N/A 03/21/2018   Procedure: EXTRACORPOREAL SHOCK WAVE LITHOTRIPSY (ESWL);  Surgeon: Irine Seal, MD;  Location: [facility];  Service: Urology;  Laterality: N/A;  . HIP ARTHROPLASTY Left 2018  . [place]  . PROCTOSCOPY N/A 08/27/2014   Procedure: PROCTOSCOPY;  Surgeon: Leighton Ruff, MD;  Location: [facility];  Service: General;  Laterality: N/A;   . TRANSANAL RECTAL RESECTION N/A 08/27/2014   Procedure: TRANSANAL RESECTION OF RECTAL TUMOR;  Surgeon: Leighton Ruff, MD;  Location: [facility];  Service: General;  Laterality: N/A;    Family History  Problem Relation Age of Onset  . Thyroid cancer Mother   . Lung cancer Father   . Colon cancer Neg Hx     Social History   Socioeconomic History  . Marital status: Married    Spouse name: Not on file  . Number of children: Not on file  . Years of education: Not on file  . Highest education level: Not on file  Occupational History  . Not on file  Tobacco Use  . Smoking status: Never Smoker  . Smokeless tobacco: Never Used  Vaping Use  . Vaping Use: Never used  Substance and Sexual Activity  . Alcohol use: No  . Drug use: No  . Sexual activity: Not on file  Other Topics Concern  . Not on file  Social History Narrative  . Not on file   Social Determinants of Health   Financial Resource Strain:   . Difficulty of Paying Living Expenses:   Food Insecurity:   . Worried About Charity fundraiser in the Last Year:   . Arboriculturist in  the Last Year:   Transportation Needs:   . Film/video editor (Medical):   Marland Kitchen Lack of Transportation (Non-Medical):   Physical Activity:   . Days of Exercise per Week:   . Minutes of Exercise per Session:   Stress:   . Feeling of Stress :   Social Connections:   . Frequency of Communication with Friends and Family:   . Frequency of Social Gatherings with Friends and Family:   . Attends Religious Services:   . Active Member of Clubs or Organizations:   . Attends Archivist Meetings:   Marland Kitchen Marital Status:   Intimate Partner Violence:   . Fear of Current or Ex-Partner:   . Emotionally Abused:   Marland Kitchen Physically Abused:   . Sexually Abused:     Outpatient Medications Prior to Visit  Medication Sig Dispense Refill  . albuterol (PROVENTIL HFA;VENTOLIN HFA) 108 (90 Base) MCG/ACT inhaler Inhale 1-2 puffs into the lungs every 6 (six)  hours as needed for wheezing or shortness of breath. 18 g 1  . aspirin 81 MG tablet Take 81 mg by mouth daily.    . cetirizine (ZYRTEC ALLERGY) 10 MG tablet Take 1 tablet (10 mg total) by mouth 2 (two) times daily. 60 tablet 3  . EPINEPHrine 0.3 mg/0.3 mL IJ SOAJ injection Inject 0.3 mLs (0.3 mg total) into the muscle as needed for anaphylaxis. 2 each 1  . famotidine (PEPCID) 40 MG tablet TAKE 1 TABLET BY MOUTH EVERY DAY 30 tablet 0  . fluticasone (FLONASE) 50 MCG/ACT nasal spray Use 2 sprays in each  nostril daily 48 g 3  . hydrocortisone (ANUSOL-HC) 2.5 % rectal cream Place 1 application rectally 2 (two) times daily. 30 g 0  . hydrocortisone 2.5 % cream Apply topically 2 (two) times daily. 30 g 0  . montelukast (SINGULAIR) 10 MG tablet TAKE 1 TABLET BY MOUTH EVERYDAY AT BEDTIME 90 tablet 0  . Multiple Vitamin (MULTIVITAMIN) tablet Take 1 tablet by mouth daily.    . rosuvastatin (CRESTOR) 10 MG tablet TAKE 1 TABLET BY MOUTH EVERY DAY 90 tablet 3  . SYMBICORT 160-4.5 MCG/ACT inhaler TAKE 2 PUFFS BY MOUTH TWICE A DAY 30.6 Inhaler 0  . tamsulosin (FLOMAX) 0.4 MG CAPS capsule Take 0.4 mg by mouth daily.     No facility-administered medications prior to visit.    Allergies  Allergen Reactions  . Lotrel [Amlodipine Besy-Benazepril Hcl] Hives and Swelling  . Meat [Alpha-Gal]   . Other Swelling    lycra   . Chalk Other (See Comments)    sneezing    ROS Review of Systems  All other systems reviewed and are negative.     Objective:    Physical Exam Constitutional:      Appearance: Normal appearance. He is well-developed and well-groomed.  HENT:     Head: Normocephalic.     Right Ear: Hearing, ear canal and external ear normal. Tympanic membrane is erythematous.     Left Ear: Hearing, ear canal and external ear normal. Tympanic membrane is erythematous and bulging.     Nose: Nose normal. No congestion or rhinorrhea.  Eyes:     Extraocular Movements: Extraocular movements intact.      Conjunctiva/sclera: Conjunctivae normal.     Pupils: Pupils are equal, round, and reactive to light.  Cardiovascular:     Rate and Rhythm: Normal rate.  Pulmonary:     Effort: Pulmonary effort is normal.  Musculoskeletal:        General: Normal  range of motion.     Cervical back: Normal range of motion and neck supple.  Skin:    General: Skin is warm and dry.     Coloration: Skin is not jaundiced or pale.  Neurological:     General: No focal deficit present.     Mental Status: He is alert and oriented to person, place, and time.  Psychiatric:        Mood and Affect: Mood normal.        Behavior: Behavior normal. Behavior is cooperative.        Thought Content: Thought content normal.        Judgment: Judgment normal.     BP 120/82 (BP Location: Left Arm, Patient Position: Sitting, Cuff Size: Normal)   Pulse (!) 54   Temp 97.6 F (36.4 C) (Temporal)   Resp 18   Wt 229 lb 6.4 oz (104.1 kg)   SpO2 97%   BMI 29.45 kg/m  Wt Readings from Last 3 Encounters:  11/02/19 229 lb 6.4 oz (104.1 kg)  09/04/19 232 lb (105.2 kg)  05/18/19 226 lb (102.5 kg)     Health Maintenance Due  Topic Date Due  . PNA vac Low Risk Adult (2 of 2 - PPSV23) 03/18/2019    There are no preventive care reminders to display for this patient.  No results found for: TSH Lab Results  Component Value Date   WBC 6.9 05/15/2019   HGB 14.8 05/15/2019   HCT 43.5 05/15/2019   MCV 91.4 05/15/2019   PLT 227 05/15/2019   Lab Results  Component Value Date   NA 140 05/15/2019   K 4.8 05/15/2019   CO2 30 05/15/2019   GLUCOSE 88 05/15/2019   BUN 12 05/15/2019   CREATININE 0.76 05/15/2019   BILITOT 0.6 05/15/2019   ALKPHOS 82 03/19/2018   AST 23 05/15/2019   ALT 19 05/15/2019   PROT 6.2 05/15/2019   ALBUMIN 3.9 03/19/2018   CALCIUM 9.0 05/15/2019   ANIONGAP 8 03/19/2018   Lab Results  Component Value Date   CHOL 134 05/15/2019   Lab Results  Component Value Date   HDL 37 (L) 05/15/2019    Lab Results  Component Value Date   LDLCALC 83 05/15/2019   Lab Results  Component Value Date   TRIG 61 05/15/2019   Lab Results  Component Value Date   CHOLHDL 3.6 05/15/2019   No results found for: HGBA1C    Assessment & Plan:   Problem List Items Addressed This Visit    None    Visit Diagnoses    Acute left otitis media    -  Primary   Relevant Medications   amoxicillin-clavulanate (AUGMENTIN) 875-125 MG tablet    Your sxs and examination are consistent with otitis media of the left ear. The right ear appears irritated and beginning to hold fluid in the tympanic membrane. I have prescribed Augmentin to treat. Take the medication as prescribed and follow up if your sxs do not resolved or worsen.   Meds ordered this encounter  Medications  . amoxicillin-clavulanate (AUGMENTIN) 875-125 MG tablet    Sig: Take 1 tablet by mouth 2 (two) times daily.    Dispense:  20 tablet    Refill:  0    Follow-up: Return if symptoms worsen or fail to improve.    Annie Main, FNP

## 2019-11-15 DIAGNOSIS — Z0120 Encounter for dental examination and cleaning without abnormal findings: Secondary | ICD-10-CM | POA: Diagnosis not present

## 2019-11-20 DIAGNOSIS — Z87442 Personal history of urinary calculi: Secondary | ICD-10-CM | POA: Diagnosis not present

## 2019-11-20 DIAGNOSIS — R351 Nocturia: Secondary | ICD-10-CM | POA: Diagnosis not present

## 2019-11-28 ENCOUNTER — Telehealth: Payer: Self-pay | Admitting: *Deleted

## 2019-11-28 DIAGNOSIS — J208 Acute bronchitis due to other specified organisms: Secondary | ICD-10-CM

## 2019-11-28 MED ORDER — DOXYCYCLINE HYCLATE 100 MG PO TABS: 100.0000 mg | ORAL_TABLET | Freq: Two times a day (BID) | ORAL | 0 refills | Status: DC

## 2019-11-28 NOTE — Telephone Encounter (Signed)
Received call from patient.   Reports that he hs flare of epididymitis. Reports that he has recurrent flares and requested ABTx.   Call placed to patient to advise to schedule OV.   [place].

## 2019-11-28 NOTE — Telephone Encounter (Signed)
Call placed to patient.   Reports that he has tenderness to B testes and a hard knotty like area to scrotum.   Prescription sent to pharmacy for ABTx per MD. Advised that if Sx do not improve, he will need to be seen.

## 2019-11-28 NOTE — Telephone Encounter (Signed)
Discussed with PCP.   Reports that patient does have recurrent flares.   Advised if Sx noted, ABTx can be sent in for patient.

## 2019-12-11 DIAGNOSIS — Z01812 Encounter for preprocedural laboratory examination: Secondary | ICD-10-CM | POA: Diagnosis not present

## 2019-12-11 DIAGNOSIS — M25551 Pain in right hip: Secondary | ICD-10-CM | POA: Diagnosis not present

## 2019-12-11 DIAGNOSIS — M1611 Unilateral primary osteoarthritis, right hip: Secondary | ICD-10-CM | POA: Diagnosis not present

## 2019-12-21 DIAGNOSIS — M1611 Unilateral primary osteoarthritis, right hip: Secondary | ICD-10-CM | POA: Diagnosis not present

## 2019-12-27 DIAGNOSIS — M25551 Pain in right hip: Secondary | ICD-10-CM | POA: Diagnosis not present

## 2019-12-27 DIAGNOSIS — R262 Difficulty in walking, not elsewhere classified: Secondary | ICD-10-CM | POA: Diagnosis not present

## 2019-12-27 DIAGNOSIS — M6281 Muscle weakness (generalized): Secondary | ICD-10-CM | POA: Diagnosis not present

## 2019-12-27 DIAGNOSIS — M25651 Stiffness of right hip, not elsewhere classified: Secondary | ICD-10-CM | POA: Diagnosis not present

## 2020-01-02 DIAGNOSIS — M25551 Pain in right hip: Secondary | ICD-10-CM | POA: Diagnosis not present

## 2020-01-02 DIAGNOSIS — M25651 Stiffness of right hip, not elsewhere classified: Secondary | ICD-10-CM | POA: Diagnosis not present

## 2020-01-02 DIAGNOSIS — R262 Difficulty in walking, not elsewhere classified: Secondary | ICD-10-CM | POA: Diagnosis not present

## 2020-01-02 DIAGNOSIS — M6281 Muscle weakness (generalized): Secondary | ICD-10-CM | POA: Diagnosis not present

## 2020-01-03 DIAGNOSIS — M25551 Pain in right hip: Secondary | ICD-10-CM | POA: Diagnosis not present

## 2020-01-10 DIAGNOSIS — R262 Difficulty in walking, not elsewhere classified: Secondary | ICD-10-CM | POA: Diagnosis not present

## 2020-01-10 DIAGNOSIS — M6281 Muscle weakness (generalized): Secondary | ICD-10-CM | POA: Diagnosis not present

## 2020-01-10 DIAGNOSIS — M25551 Pain in right hip: Secondary | ICD-10-CM | POA: Diagnosis not present

## 2020-01-10 DIAGNOSIS — M25651 Stiffness of right hip, not elsewhere classified: Secondary | ICD-10-CM | POA: Diagnosis not present

## 2020-01-17 DIAGNOSIS — M6281 Muscle weakness (generalized): Secondary | ICD-10-CM | POA: Diagnosis not present

## 2020-01-17 DIAGNOSIS — R262 Difficulty in walking, not elsewhere classified: Secondary | ICD-10-CM | POA: Diagnosis not present

## 2020-01-17 DIAGNOSIS — M25651 Stiffness of right hip, not elsewhere classified: Secondary | ICD-10-CM | POA: Diagnosis not present

## 2020-01-17 DIAGNOSIS — M25551 Pain in right hip: Secondary | ICD-10-CM | POA: Diagnosis not present

## 2020-01-24 DIAGNOSIS — M25651 Stiffness of right hip, not elsewhere classified: Secondary | ICD-10-CM | POA: Diagnosis not present

## 2020-01-24 DIAGNOSIS — M6281 Muscle weakness (generalized): Secondary | ICD-10-CM | POA: Diagnosis not present

## 2020-01-24 DIAGNOSIS — R262 Difficulty in walking, not elsewhere classified: Secondary | ICD-10-CM | POA: Diagnosis not present

## 2020-03-22 DIAGNOSIS — Z20822 Contact with and (suspected) exposure to covid-19: Secondary | ICD-10-CM | POA: Diagnosis not present

## 2020-03-22 DIAGNOSIS — Z03818 Encounter for observation for suspected exposure to other biological agents ruled out: Secondary | ICD-10-CM | POA: Diagnosis not present

## 2020-03-27 DIAGNOSIS — Z20822 Contact with and (suspected) exposure to covid-19: Secondary | ICD-10-CM | POA: Diagnosis not present

## 2020-05-14 ENCOUNTER — Other Ambulatory Visit: Payer: Self-pay | Admitting: Family Medicine

## 2020-05-15 DIAGNOSIS — L820 Inflamed seborrheic keratosis: Secondary | ICD-10-CM | POA: Diagnosis not present

## 2020-07-31 ENCOUNTER — Ambulatory Visit (INDEPENDENT_AMBULATORY_CARE_PROVIDER_SITE_OTHER): Payer: Medicare Other | Admitting: Family Medicine

## 2020-07-31 ENCOUNTER — Other Ambulatory Visit: Payer: Self-pay

## 2020-07-31 ENCOUNTER — Encounter: Payer: Self-pay | Admitting: Family Medicine

## 2020-07-31 VITALS — BP 130/64 | HR 58 | Temp 98.1°F | Resp 14 | Ht 74.0 in | Wt 228.0 lb

## 2020-07-31 DIAGNOSIS — Z0001 Encounter for general adult medical examination with abnormal findings: Secondary | ICD-10-CM | POA: Diagnosis not present

## 2020-07-31 DIAGNOSIS — E7800 Pure hypercholesterolemia, unspecified: Secondary | ICD-10-CM

## 2020-07-31 DIAGNOSIS — Z125 Encounter for screening for malignant neoplasm of prostate: Secondary | ICD-10-CM | POA: Diagnosis not present

## 2020-07-31 DIAGNOSIS — Z0000 Encounter for general adult medical examination without abnormal findings: Secondary | ICD-10-CM

## 2020-07-31 DIAGNOSIS — T781XXA Other adverse food reactions, not elsewhere classified, initial encounter: Secondary | ICD-10-CM | POA: Diagnosis not present

## 2020-07-31 DIAGNOSIS — J069 Acute upper respiratory infection, unspecified: Secondary | ICD-10-CM

## 2020-07-31 NOTE — Progress Notes (Signed)
Subjective:    Patient ID: Thomas Livingston, male    DOB: 12-Feb-1951, 70 y.o.   MRN: [id number]  HPI Patient is a very pleasant 70 year old Caucasian male here today for complete physical exam.  He did develop an upper respiratory infection 2 days ago.  Symptoms include head congestion and rhinorrhea and postnasal drip.  He denies any cough, chest pain, shortness of breath.  He denies any fevers.  He has had all 3 doses of his COVID vaccine.  He does not feel that this is allergies because he does feel achy and tired like he is coming down with an infection.  He denies any purulent cough.  Last colonoscopy was in 2016 and was within normal limits.  Next colonoscopy is due in 2026.  He is due for a PSA to screen for prostate cancer.  He does not smoke and therefore does not require lung cancer screening.  His immunizations are listed below. Immunization History  Administered Date(s) Administered  . Fluad Quad(high Dose 65+) 05/18/2019  . Influenza, High Dose Seasonal PF 03/01/2018  . Influenza,inj,Quad PF,6+ Mos 02/27/2014, 06/25/2015, 02/20/2016  . Influenza-Unspecified 03/02/2017  . PFIZER(Purple Top)SARS-COV-2 Vaccination 07/14/2019, 08/09/2019  . Pneumococcal Conjugate-13 03/17/2018  . Tdap 11/14/2009, 12/16/2013  . Zoster 02/27/2014   Immunizations are up-to-date.  He denies any falls, depression, or memory loss.  Blood pressure today is excellent.  Past Medical History:  Diagnosis Date  . Allergy   . Arthritis    left hip  . Asthma   . History of kidney stones   . Hyperlipidemia   . Hypertension 2012   no bp meds since 2012  . Multiple lung nodules on CT 2007   no current follow up done  . Psoriasis    both legs  . Urticaria   . Urticaria 03/31/2018   Past Surgical History:  Procedure Laterality Date  . CHOLECYSTECTOMY  2007  . EUS N/A 06/28/2014   Procedure: LOWER ENDOSCOPIC ULTRASOUND (EUS);  Surgeon: Milus Banister, MD;  Location: [facility];  Service: Endoscopy;   Laterality: N/A;  . EXTRACORPOREAL SHOCK WAVE LITHOTRIPSY N/A 03/21/2018   Procedure: EXTRACORPOREAL SHOCK WAVE LITHOTRIPSY (ESWL);  Surgeon: Irine Seal, MD;  Location: [facility];  Service: Urology;  Laterality: N/A;  . HIP ARTHROPLASTY Left 2018  . [place]  . PROCTOSCOPY N/A 08/27/2014   Procedure: PROCTOSCOPY;  Surgeon: Leighton Ruff, MD;  Location: [facility];  Service: General;  Laterality: N/A;  . TRANSANAL RECTAL RESECTION N/A 08/27/2014   Procedure: TRANSANAL RESECTION OF RECTAL TUMOR;  Surgeon: Leighton Ruff, MD;  Location: [facility];  Service: General;  Laterality: N/A;   Current Outpatient Medications on File Prior to Visit  Medication Sig Dispense Refill  . aspirin 81 MG tablet Take 81 mg by mouth daily.    Marland Kitchen EPINEPHrine 0.3 mg/0.3 mL IJ SOAJ injection Inject 0.3 mLs (0.3 mg total) into the muscle as needed for anaphylaxis. 2 each 1  . fluticasone (FLONASE) 50 MCG/ACT nasal spray Use 2 sprays in each  nostril daily 48 g 3  . Multiple Vitamin (MULTIVITAMIN) tablet Take 1 tablet by mouth daily.    . rosuvastatin (CRESTOR) 10 MG tablet TAKE 1 TABLET BY MOUTH EVERY DAY 90 tablet 3  . tamsulosin (FLOMAX) 0.4 MG CAPS capsule Take 0.4 mg by mouth daily. (Patient not taking: Reported on 07/31/2020)     No current facility-administered medications on file prior to visit.   Allergies  Allergen Reactions  . Lotrel [  Amlodipine Besy-Benazepril Hcl] Hives and Swelling  . Meat [Alpha-Gal]   . Other Swelling    lycra   . Chalk Other (See Comments)    sneezing   Social History   Socioeconomic History  . Marital status: Married    Spouse name: Not on file  . Number of children: Not on file  . Years of education: Not on file  . Highest education level: Not on file  Occupational History  . Not on file  Tobacco Use  . Smoking status: Never Smoker  . Smokeless tobacco: Never Used  Vaping Use  . Vaping Use: Never used  Substance and Sexual Activity  . Alcohol use: No  . Drug use:  No  . Sexual activity: Not on file  Other Topics Concern  . Not on file  Social History Narrative  . Not on file   Social Determinants of Health   Financial Resource Strain: Not on file  Food Insecurity: Not on file  Transportation Needs: Not on file  Physical Activity: Not on file  Stress: Not on file  Social Connections: Not on file  Intimate Partner Violence: Not on file   Family History  Problem Relation Age of Onset  . Thyroid cancer Mother   . Lung cancer Father   . Colon cancer Neg Hx       Review of Systems  All other systems reviewed and are negative.      Objective:   Physical Exam Vitals reviewed.  Constitutional:      General: He is not in acute distress.    Appearance: He is well-developed. He is not diaphoretic.  HENT:     Head: Normocephalic and atraumatic.     Right Ear: External ear normal.     Left Ear: External ear normal.     Nose: Nose normal.     Mouth/Throat:     Pharynx: No oropharyngeal exudate.  Eyes:     General: No scleral icterus.       Right eye: No discharge.        Left eye: No discharge.     Conjunctiva/sclera: Conjunctivae normal.     Pupils: Pupils are equal, round, and reactive to light.  Neck:     Thyroid: No thyromegaly.     Vascular: No JVD.     Trachea: No tracheal deviation.  Cardiovascular:     Rate and Rhythm: Normal rate and regular rhythm.     Heart sounds: Normal heart sounds. No murmur heard. No friction rub. No gallop.   Pulmonary:     Effort: Pulmonary effort is normal. No respiratory distress.     Breath sounds: Normal breath sounds. No stridor. No wheezing or rales.  Chest:     Chest wall: No tenderness.  Abdominal:     General: Bowel sounds are normal. There is no distension.     Palpations: Abdomen is soft. There is no mass.     Tenderness: There is no abdominal tenderness. There is no guarding or rebound.  Musculoskeletal:        General: No tenderness or deformity. Normal range of motion.      Cervical back: Normal range of motion and neck supple.  Lymphadenopathy:     Cervical: No cervical adenopathy.  Skin:    General: Skin is warm.     Coloration: Skin is not pale.     Findings: No erythema or rash.  Neurological:     Mental Status: He is alert and oriented to  person, place, and time.     Cranial Nerves: No cranial nerve deficit.     Motor: No abnormal muscle tone.     Coordination: Coordination normal.     Deep Tendon Reflexes: Reflexes are normal and symmetric.  Psychiatric:        Behavior: Behavior normal.        Thought Content: Thought content normal.        Judgment: Judgment normal.           Assessment & Plan:  General medical exam  Prostate cancer screening - Plan: PSA  Pure hypercholesterolemia - Plan: CBC with Differential/Platelet, COMPLETE METABOLIC PANEL WITH GFR, Lipid panel  Allergic reaction to alpha-gal - Plan: Alpha-Gal Panel  Viral upper respiratory tract infection - Plan: SARS-COV-2 RNA,(COVID-19) QUAL NAAT  Patient does have a history of alpha gal.  He has not had an allergic reaction to meet in several years because he is avoided meat altogether.  He would like to recheck a panel today to see if the antibody levels are still elevated because he would desperately like to try to start eating meat again.  I will check a PSA to screen for prostate cancer.  Blood pressure is outstanding.  I will check a CBC, CMP, and a fasting lipid panel.  I believe he likely has either a mild viral URI or perhaps allergies.  I recommended trying Flonase and Xyzal for this.  I will check a Covid test given the current pandemic.  He denies any falls, depression, or memory loss.  He does have what appears to be a basal cell carcinoma on his right nare.  I recommended that he see his dermatologist as soon as possible to have this addressed.

## 2020-08-01 ENCOUNTER — Encounter: Payer: Medicare Other | Admitting: Family Medicine

## 2020-08-01 LAB — COMPLETE METABOLIC PANEL WITH GFR
AST: 20 U/L (ref 10–35)
Albumin: 4 g/dL (ref 3.6–5.1)
CO2: 30 mmol/L (ref 20–32)
GFR, Est African American: 97 mL/min/{1.73_m2} (ref >=60)
Sodium: 140 mmol/L (ref 135–146)
Total Bilirubin: 0.5 mg/dL (ref 0.2–1.2)
Total Protein: 6.6 g/dL (ref 6.1–8.1)

## 2020-08-01 LAB — LIPID PANEL
LDL Cholesterol (Calc): 78 mg/dL (calc)
Total CHOL/HDL Ratio: 3.7 (calc) (ref <5.0)

## 2020-08-01 LAB — CBC WITH DIFFERENTIAL/PLATELET
Basophils Absolute: 59 cells/uL (ref 0–200)
MCH: 31.2 pg (ref 27.0–33.0)
Monocytes Relative: 16.4 %
Neutrophils Relative %: 62.9 %
RDW: 12.3 % (ref 11.0–15.0)

## 2020-08-01 LAB — SARS-COV-2 RNA,(COVID-19) QUALITATIVE NAAT: SARS CoV2 RNA: NOT DETECTED

## 2020-08-05 LAB — ALPHA-GAL PANEL
Beef IgE: 6.79 kU/L — ABNORMAL HIGH (ref <0.35)
Class: 2
Class: 2
Class: 3
Galactose-alpha-1,3-galactose IgE: 9.68 kU/L — ABNORMAL HIGH (ref <0.10)
LAMB/MUTTON IGE: 1.29 kU/L — ABNORMAL HIGH (ref <0.35)
Pork IgE: 2.86 kU/L — ABNORMAL HIGH (ref <0.35)

## 2020-08-05 LAB — COMPLETE METABOLIC PANEL WITH GFR
AG Ratio: 1.5 (calc) (ref 1.0–2.5)
ALT: 17 U/L (ref 9–46)
Alkaline phosphatase (APISO): 86 U/L (ref 35–144)
BUN: 9 mg/dL (ref 7–25)
Calcium: 9.3 mg/dL (ref 8.6–10.3)
Chloride: 101 mmol/L (ref 98–110)
Creat: 0.94 mg/dL (ref 0.70–1.25)
GFR, Est Non African American: 84 mL/min/{1.73_m2} (ref >=60)
Globulin: 2.6 g/dL (calc) (ref 1.9–3.7)
Glucose, Bld: 79 mg/dL (ref 65–99)
Potassium: 4.8 mmol/L (ref 3.5–5.3)

## 2020-08-05 LAB — PSA: PSA: 0.7 ng/mL (ref <=4.0)

## 2020-08-05 LAB — CBC WITH DIFFERENTIAL/PLATELET
Absolute Monocytes: 1214 cells/uL — ABNORMAL HIGH (ref 200–950)
Basophils Relative: 0.8 %
Eosinophils Absolute: 496 cells/uL (ref 15–500)
Eosinophils Relative: 6.7 %
HCT: 43.1 % (ref 38.5–50.0)
Hemoglobin: 14.4 g/dL (ref 13.2–17.1)
Lymphs Abs: 977 cells/uL (ref 850–3900)
MCHC: 33.4 g/dL (ref 32.0–36.0)
MCV: 93.3 fL (ref 80.0–100.0)
MPV: 11.1 fL (ref 7.5–12.5)
Neutro Abs: 4655 cells/uL (ref 1500–7800)
Platelets: 258 10*3/uL (ref 140–400)
RBC: 4.62 10*6/uL (ref 4.20–5.80)
Total Lymphocyte: 13.2 %
WBC: 7.4 10*3/uL (ref 3.8–10.8)

## 2020-08-05 LAB — LIPID PANEL
Cholesterol: 133 mg/dL (ref <200)
HDL: 36 mg/dL — ABNORMAL LOW (ref >=40)
Non-HDL Cholesterol (Calc): 97 mg/dL (calc) (ref <130)
Triglycerides: 109 mg/dL (ref <150)

## 2020-08-12 ENCOUNTER — Encounter: Payer: Self-pay | Admitting: Family Medicine

## 2020-08-12 DIAGNOSIS — C44311 Basal cell carcinoma of skin of nose: Secondary | ICD-10-CM | POA: Diagnosis not present

## 2020-08-12 DIAGNOSIS — D485 Neoplasm of uncertain behavior of skin: Secondary | ICD-10-CM | POA: Diagnosis not present

## 2020-08-13 ENCOUNTER — Emergency Department (HOSPITAL_COMMUNITY)
Admission: EM | Admit: 2020-08-13 | Discharge: 2020-08-13 | Disposition: A | Discharge status: Home / Self Care | Payer: Medicare Other | Attending: Emergency Medicine | Admitting: Emergency Medicine

## 2020-08-13 ENCOUNTER — Other Ambulatory Visit: Payer: Self-pay

## 2020-08-13 ENCOUNTER — Encounter (HOSPITAL_COMMUNITY): Payer: Self-pay

## 2020-08-13 DIAGNOSIS — Z79899 Other long term (current) drug therapy: Secondary | ICD-10-CM | POA: Insufficient documentation

## 2020-08-13 DIAGNOSIS — I10 Essential (primary) hypertension: Secondary | ICD-10-CM | POA: Diagnosis not present

## 2020-08-13 DIAGNOSIS — T7840XA Allergy, unspecified, initial encounter: Secondary | ICD-10-CM

## 2020-08-13 DIAGNOSIS — J45909 Unspecified asthma, uncomplicated: Secondary | ICD-10-CM | POA: Diagnosis not present

## 2020-08-13 DIAGNOSIS — L509 Urticaria, unspecified: Secondary | ICD-10-CM | POA: Diagnosis not present

## 2020-08-13 DIAGNOSIS — Z96642 Presence of left artificial hip joint: Secondary | ICD-10-CM | POA: Insufficient documentation

## 2020-08-13 DIAGNOSIS — Z7982 Long term (current) use of aspirin: Secondary | ICD-10-CM | POA: Insufficient documentation

## 2020-08-13 DIAGNOSIS — T781XXA Other adverse food reactions, not elsewhere classified, initial encounter: Secondary | ICD-10-CM | POA: Diagnosis not present

## 2020-08-13 DIAGNOSIS — Z7952 Long term (current) use of systemic steroids: Secondary | ICD-10-CM | POA: Insufficient documentation

## 2020-08-13 MED ORDER — METHYLPREDNISOLONE SODIUM SUCC 125 MG IJ SOLR
125.0000 mg | Freq: Once | INTRAMUSCULAR | Status: AC
  Administered 2020-08-13: 125 mg via INTRAVENOUS
  Filled 2020-08-13: qty 2

## 2020-08-13 MED ORDER — DIPHENHYDRAMINE HCL 50 MG/ML IJ SOLN
25.0000 mg | Freq: Once | INTRAMUSCULAR | Status: AC
  Administered 2020-08-13: 25 mg via INTRAVENOUS
  Filled 2020-08-13: qty 1

## 2020-08-13 MED ORDER — FAMOTIDINE IN NACL 20-0.9 MG/50ML-% IV SOLN
20.0000 mg | Freq: Once | INTRAVENOUS | Status: AC
  Administered 2020-08-13: 20 mg via INTRAVENOUS
  Filled 2020-08-13: qty 50

## 2020-08-13 MED ORDER — SODIUM CHLORIDE 0.9 % IV SOLN: INTRAVENOUS | Status: DC

## 2020-08-13 MED ORDER — PREDNISONE 10 MG (21) PO TBPK: ORAL_TABLET | ORAL | 0 refills | Status: DC

## 2020-08-13 MED ORDER — SODIUM CHLORIDE 0.9 % IV BOLUS
1000.0000 mL | Freq: Once | INTRAVENOUS | Status: AC
  Administered 2020-08-13: 1000 mL via INTRAVENOUS

## 2020-08-13 MED ORDER — EPINEPHRINE 0.3 MG/0.3ML IJ SOAJ: 0.3000 mg | INTRAMUSCULAR | 1 refills | Status: DC | PRN

## 2020-08-13 NOTE — ED Provider Notes (Signed)
[facility] DEPT Provider Note   CSN: [id number] Arrival date & time: 08/13/20  1749     History Chief Complaint  Patient presents with  . Allergic Reaction    Thomas Livingston is a 70 y.o. male.  Pt presents to the ED today with an allergic reaction.  Pt has a hx of alpha gal allergy, but accidentally ate red meat today.  He broke out in hives and started feeling like his tongue was swelling.  He took benadryl, pepcid, and gave himself an epi pen.  He is feeling a little better, but his tongue still feels swollen and he has hives all over.  No sob.        Past Medical History:  Diagnosis Date  . Allergy   . Arthritis    left hip  . Asthma   . History of kidney stones   . Hyperlipidemia   . Hypertension 2012   no bp meds since 2012  . Multiple lung nodules on CT 2007   no current follow up done  . Psoriasis    both legs  . Urticaria   . Urticaria 03/31/2018    Patient Active Problem List   Diagnosis Date Noted  . Solitary bone cyst of right forearm 01/26/2019  . Ganglion cyst of flexor tendon sheath of finger of left hand 01/26/2019  . Pain of right forearm 05/19/2018  . Allergy to alpha-gal 05/18/2018  . Asthma, well controlled 03/31/2018  . Other allergic rhinitis 03/31/2018  . Rectal mass 08/27/2014  . Hypertension   . Hyperlipidemia     Past Surgical History:  Procedure Laterality Date  . CHOLECYSTECTOMY  2007  . EUS N/A 06/28/2014   Procedure: LOWER ENDOSCOPIC ULTRASOUND (EUS);  Surgeon: Milus Banister, MD;  Location: [facility];  Service: Endoscopy;  Laterality: N/A;  . EXTRACORPOREAL SHOCK WAVE LITHOTRIPSY N/A 03/21/2018   Procedure: EXTRACORPOREAL SHOCK WAVE LITHOTRIPSY (ESWL);  Surgeon: Irine Seal, MD;  Location: [facility];  Service: Urology;  Laterality: N/A;  . HIP ARTHROPLASTY Left 2018  . [place]  . PROCTOSCOPY N/A 08/27/2014   Procedure: PROCTOSCOPY;  Surgeon: Leighton Ruff, MD;  Location: [facility];   Service: General;  Laterality: N/A;  . TRANSANAL RECTAL RESECTION N/A 08/27/2014   Procedure: TRANSANAL RESECTION OF RECTAL TUMOR;  Surgeon: Leighton Ruff, MD;  Location: [facility];  Service: General;  Laterality: N/A;       Family History  Problem Relation Age of Onset  . Thyroid cancer Mother   . Lung cancer Father   . Colon cancer Neg Hx     Social History   Tobacco Use  . Smoking status: Never Smoker  . Smokeless tobacco: Never Used  Vaping Use  . Vaping Use: Never used  Substance Use Topics  . Alcohol use: No  . Drug use: No    Home Medications Prior to Admission medications   Medication Sig Start Date End Date Taking? Authorizing Provider  predniSONE (STERAPRED UNI-PAK 21 TAB) 10 MG (21) TBPK tablet Take 6 tabs for 2 days, then 5 for 2 days, then 4 for 2 days, then 3 for 2 days, 2 for 2 days, then 1 for 2 days 08/13/20  Yes Isla Pence, MD  aspirin 81 MG tablet Take 81 mg by mouth daily.    [provider]  EPINEPHrine 0.3 mg/0.3 mL IJ SOAJ injection Inject 0.3 mg into the muscle as needed for anaphylaxis. 08/13/20   Isla Pence, MD  fluticasone (  FLONASE) 50 MCG/ACT nasal spray Use 2 sprays in each  nostril daily 01/10/14   Susy Frizzle, MD  Multiple Vitamin (MULTIVITAMIN) tablet Take 1 tablet by mouth daily.    [provider]  rosuvastatin (CRESTOR) 10 MG tablet TAKE 1 TABLET BY MOUTH EVERY DAY 05/15/20   Susy Frizzle, MD  tamsulosin (FLOMAX) 0.4 MG CAPS capsule Take 0.4 mg by mouth daily. Patient not taking: Reported on 07/31/2020 04/12/19   [provider]    Allergies    Lotrel [amlodipine besy-benazepril hcl], Meat [alpha-gal], Other, and Chalk  Review of Systems   Review of Systems  Skin: Positive for rash.  All other systems reviewed and are negative.   Physical Exam Updated Vital Signs BP 133/88 (BP Location: Right Arm)   Pulse 70   Temp 97.8 F (36.6 C) (Oral)   Resp 16   Ht 6\' 2"  (1.88 m)   Wt 102.5 kg   SpO2 99%    BMI 29.02 kg/m   Physical Exam Vitals and nursing note reviewed.  Constitutional:      Appearance: Normal appearance.  HENT:     Head: Normocephalic and atraumatic.     Comments: I don't see any obvious swelling of the tongue    Right Ear: External ear normal.     Left Ear: External ear normal.  Eyes:     Extraocular Movements: Extraocular movements intact.     Conjunctiva/sclera: Conjunctivae normal.     Pupils: Pupils are equal, round, and reactive to light.  Cardiovascular:     Rate and Rhythm: Normal rate and regular rhythm.     Pulses: Normal pulses.     Heart sounds: Normal heart sounds.  Pulmonary:     Effort: Pulmonary effort is normal.     Breath sounds: Normal breath sounds.  Abdominal:     General: Abdomen is flat. Bowel sounds are normal.     Palpations: Abdomen is soft.  Musculoskeletal:        General: Normal range of motion.     Cervical back: Normal range of motion and neck supple.  Skin:    General: Skin is warm.     Capillary Refill: Capillary refill takes less than 2 seconds.     Comments: Diffuse urticaria  Neurological:     General: No focal deficit present.     Mental Status: He is alert and oriented to person, place, and time.  Psychiatric:        Mood and Affect: Mood normal.        Behavior: Behavior normal.        Thought Content: Thought content normal.        Judgment: Judgment normal.     ED Results / Procedures / Treatments   Labs (all labs ordered are listed, but only abnormal results are displayed) Labs Reviewed - No data to display  EKG None  Radiology No results found.  Procedures Procedures   Medications Ordered in ED Medications  sodium chloride 0.9 % bolus 1,000 mL (0 mLs Intravenous Stopped 08/13/20 1933)    And  0.9 %  sodium chloride infusion ( Intravenous New Bag/Given 08/13/20 1933)  diphenhydrAMINE (BENADRYL) injection 25 mg (25 mg Intravenous Given 08/13/20 1832)  methylPREDNISolone sodium succinate (SOLU-MEDROL)  125 mg/2 mL injection 125 mg (125 mg Intravenous Given 08/13/20 1833)  famotidine (PEPCID) IVPB 20 mg premix (0 mg Intravenous Stopped 08/13/20 1908)    ED Course  I have reviewed the triage vital signs and  the nursing notes.  Pertinent labs & imaging results that were available during my care of the patient were reviewed by me and considered in my medical decision making (see chart for details).    MDM Rules/Calculators/A&P                          Pt has been watched for several hours and is feeling much better.  His wife is a retired Therapist, sports, so he is going home in good hands.  Pt knows to return if worse.  F/u with pcp. Final Clinical Impression(s) / ED Diagnoses Final diagnoses:  Allergic reaction, initial encounter    Rx / DC Orders ED Discharge Orders         Ordered    predniSONE (STERAPRED UNI-PAK 21 TAB) 10 MG (21) TBPK tablet        08/13/20 2033    EPINEPHrine 0.3 mg/0.3 mL IJ SOAJ injection  As needed        08/13/20 2033           Isla Pence, MD 08/13/20 2034

## 2020-08-13 NOTE — ED Triage Notes (Signed)
Pt states he has alpha gal allergy and accidentally ingested red meat at lunch time and is now experiencing hives, itching, numbness of tongue. Pt used epi pen 25 min ago, 50mg  benadryl and 1 pepcid pta. Pt has extensive hives around abdomen and armpits

## 2020-08-13 NOTE — ED Triage Notes (Signed)
Emergency Medicine Provider Triage Evaluation Note  Thomas Livingston , a 70 y.o. male  was evaluated in triage.  Pt complains of allergic reaction.  He accidentally ate red meet about 6 hours ago.  History of alpha gal allergy.  He took an epi pen about 25 minutes ago.  He also took 50mg  of benadryl and one pill of pepcid around the same time.  He states this improved his shortness of breath but he is having numbness on his tongue and still having severe itching and body wide hives.   Physical Exam  Pulse 73   Temp 97.8 F (36.6 C) (Oral)   Resp 18   SpO2 99%  Patient is awake and alert.  Diffuse urticaria across torso.  Speech is not slurred.  No tongue edema or swelling of the lips or tongue at this time.    Medical Decision Making  Medically screening exam initiated at 6:00 PM.  Appropriate orders placed.  [name] was informed that the remainder of the evaluation will be completed by another provider, this initial triage assessment does not replace that evaluation, and the importance of remaining in the ED until their evaluation is complete.  Given that patient had ongoing allergic reaction and used epi pen will need room in the back to be seen.    Lorin Glass, [place] 08/13/20 1806

## 2020-09-11 DIAGNOSIS — Z85828 Personal history of other malignant neoplasm of skin: Secondary | ICD-10-CM | POA: Diagnosis not present

## 2020-09-11 DIAGNOSIS — C44311 Basal cell carcinoma of skin of nose: Secondary | ICD-10-CM | POA: Diagnosis not present

## 2020-09-25 ENCOUNTER — Encounter (HOSPITAL_COMMUNITY): Payer: Self-pay

## 2020-09-25 ENCOUNTER — Other Ambulatory Visit: Payer: Self-pay

## 2020-09-25 ENCOUNTER — Emergency Department (HOSPITAL_COMMUNITY)
Admission: EM | Admit: 2020-09-25 | Discharge: 2020-09-25 | Disposition: A | Discharge status: Home / Self Care | Payer: Medicare Other | Attending: Emergency Medicine | Admitting: Emergency Medicine

## 2020-09-25 DIAGNOSIS — X58XXXA Exposure to other specified factors, initial encounter: Secondary | ICD-10-CM | POA: Diagnosis not present

## 2020-09-25 DIAGNOSIS — J45909 Unspecified asthma, uncomplicated: Secondary | ICD-10-CM | POA: Diagnosis not present

## 2020-09-25 DIAGNOSIS — T7840XA Allergy, unspecified, initial encounter: Secondary | ICD-10-CM | POA: Diagnosis not present

## 2020-09-25 DIAGNOSIS — I10 Essential (primary) hypertension: Secondary | ICD-10-CM | POA: Insufficient documentation

## 2020-09-25 DIAGNOSIS — Z7951 Long term (current) use of inhaled steroids: Secondary | ICD-10-CM | POA: Diagnosis not present

## 2020-09-25 DIAGNOSIS — Z7982 Long term (current) use of aspirin: Secondary | ICD-10-CM | POA: Insufficient documentation

## 2020-09-25 DIAGNOSIS — Z96642 Presence of left artificial hip joint: Secondary | ICD-10-CM | POA: Diagnosis not present

## 2020-09-25 DIAGNOSIS — Z91014 Allergy to mammalian meats: Secondary | ICD-10-CM | POA: Insufficient documentation

## 2020-09-25 DIAGNOSIS — Z79899 Other long term (current) drug therapy: Secondary | ICD-10-CM | POA: Insufficient documentation

## 2020-09-25 DIAGNOSIS — T781XXA Other adverse food reactions, not elsewhere classified, initial encounter: Secondary | ICD-10-CM

## 2020-09-25 MED ORDER — METHYLPREDNISOLONE SODIUM SUCC 125 MG IJ SOLR
125.0000 mg | Freq: Once | INTRAMUSCULAR | Status: AC
  Administered 2020-09-25: 125 mg via INTRAVENOUS
  Filled 2020-09-25: qty 2

## 2020-09-25 MED ORDER — DOXEPIN HCL 10 MG PO CAPS
10.0000 mg | ORAL_CAPSULE | Freq: Once | ORAL | Status: AC
  Administered 2020-09-25: 10 mg via ORAL
  Filled 2020-09-25: qty 1

## 2020-09-25 MED ORDER — DIPHENHYDRAMINE HCL 50 MG/ML IJ SOLN
25.0000 mg | Freq: Once | INTRAMUSCULAR | Status: AC
  Administered 2020-09-25: 25 mg via INTRAVENOUS
  Filled 2020-09-25: qty 1

## 2020-09-25 MED ORDER — DOXEPIN HCL 10 MG PO CAPS: 10.0000 mg | ORAL_CAPSULE | Freq: Three times a day (TID) | ORAL | 0 refills | Status: DC

## 2020-09-25 MED ORDER — PREDNISONE 50 MG PO TABS: 50.0000 mg | ORAL_TABLET | Freq: Every day | ORAL | 0 refills | Status: DC

## 2020-09-25 NOTE — Discharge Instructions (Addendum)
Take loratadine (Claritin) or cetirizine (Zyrtec) once a day for the next 7 days.  Take diphenhydramine as needed for breakthrough itching.  Return to the emergency department if symptoms are worsening.

## 2020-09-25 NOTE — ED Provider Notes (Signed)
[facility] DEPT Provider Note   CSN: [id number] Arrival date & time: 09/25/20  0403     History Chief Complaint  Patient presents with  . Allergic Reaction    Thomas Livingston is a 70 y.o. male.  The history is provided by the patient.  Allergic Reaction He has history of hypertension, hyperlipidemia, alpha gal syndrome and comes in because of hives.  He states that he ate some [place] that may have actually been contaminated with some meat.  He woke up at about 3 AM with hives starting in his hip areas and extending up to his axillae.  There was some mild dyspnea.  He took diphenhydramine 50 mg, famotidine 20 mg and use his EpiPen.  He is feeling somewhat better although he still has some itching.  Breathing is pretty much back to his baseline.   Past Medical History:  Diagnosis Date  . Allergy   . Arthritis    left hip  . Asthma   . History of kidney stones   . Hyperlipidemia   . Hypertension 2012   no bp meds since 2012  . Multiple lung nodules on CT 2007   no current follow up done  . Psoriasis    both legs  . Urticaria   . Urticaria 03/31/2018    Patient Active Problem List   Diagnosis Date Noted  . Solitary bone cyst of right forearm 01/26/2019  . Ganglion cyst of flexor tendon sheath of finger of left hand 01/26/2019  . Pain of right forearm 05/19/2018  . Allergy to alpha-gal 05/18/2018  . Asthma, well controlled 03/31/2018  . Other allergic rhinitis 03/31/2018  . Rectal mass 08/27/2014  . Hypertension   . Hyperlipidemia     Past Surgical History:  Procedure Laterality Date  . CHOLECYSTECTOMY  2007  . EUS N/A 06/28/2014   Procedure: LOWER ENDOSCOPIC ULTRASOUND (EUS);  Surgeon: Milus Banister, MD;  Location: [facility];  Service: Endoscopy;  Laterality: N/A;  . EXTRACORPOREAL SHOCK WAVE LITHOTRIPSY N/A 03/21/2018   Procedure: EXTRACORPOREAL SHOCK WAVE LITHOTRIPSY (ESWL);  Surgeon: Irine Seal, MD;  Location: [facility];   Service: Urology;  Laterality: N/A;  . HIP ARTHROPLASTY Left 2018  . [place]  . PROCTOSCOPY N/A 08/27/2014   Procedure: PROCTOSCOPY;  Surgeon: Leighton Ruff, MD;  Location: [facility];  Service: General;  Laterality: N/A;  . TRANSANAL RECTAL RESECTION N/A 08/27/2014   Procedure: TRANSANAL RESECTION OF RECTAL TUMOR;  Surgeon: Leighton Ruff, MD;  Location: [facility];  Service: General;  Laterality: N/A;       Family History  Problem Relation Age of Onset  . Thyroid cancer Mother   . Lung cancer Father   . Colon cancer Neg Hx     Social History   Tobacco Use  . Smoking status: Never Smoker  . Smokeless tobacco: Never Used  Vaping Use  . Vaping Use: Never used  Substance Use Topics  . Alcohol use: No  . Drug use: No    Home Medications Prior to Admission medications   Medication Sig Start Date End Date Taking? Authorizing Provider  aspirin 81 MG tablet Take 81 mg by mouth daily.    [provider]  EPINEPHrine 0.3 mg/0.3 mL IJ SOAJ injection Inject 0.3 mg into the muscle as needed for anaphylaxis. 08/13/20   Isla Pence, MD  fluticasone Asencion Islam) 50 MCG/ACT nasal spray Use 2 sprays in each  nostril daily 01/10/14   Susy Frizzle, MD  Multiple Vitamin (MULTIVITAMIN) tablet Take 1 tablet by mouth daily.    [provider]  predniSONE (STERAPRED UNI-PAK 21 TAB) 10 MG (21) TBPK tablet Take 6 tabs for 2 days, then 5 for 2 days, then 4 for 2 days, then 3 for 2 days, 2 for 2 days, then 1 for 2 days 08/13/20   Isla Pence, MD  rosuvastatin (CRESTOR) 10 MG tablet TAKE 1 TABLET BY MOUTH EVERY DAY 05/15/20   Susy Frizzle, MD  tamsulosin (FLOMAX) 0.4 MG CAPS capsule Take 0.4 mg by mouth daily. Patient not taking: Reported on 07/31/2020 04/12/19   [provider]    Allergies    Lotrel [amlodipine besy-benazepril hcl], Meat [alpha-gal], Other, and Chalk  Review of Systems   Review of Systems  All other systems reviewed and are  negative.   Physical Exam Updated Vital Signs BP (!) 145/88 (BP Location: Right Arm)   Pulse 70   Temp 97.7 F (36.5 C) (Oral)   Resp 18   Ht 6\' 2"  (1.88 m)   Wt 103.4 kg   SpO2 99%   BMI 29.27 kg/m   Physical Exam Vitals and nursing note reviewed.   70 year old male, resting comfortably and in no acute distress. Vital signs are significant for mildly elevated blood pressure. Oxygen saturation is 99%, which is normal. Head is normocephalic and atraumatic. PERRLA, EOMI. Oropharynx shows mild edema of the uvula. Neck is nontender and supple without adenopathy or JVD. Back is nontender and there is no CVA tenderness. Lungs are clear without rales, wheezes, or rhonchi. Chest is nontender. Heart has regular rate and rhythm without murmur. Abdomen is soft, flat, nontender without masses or hepatosplenomegaly and peristalsis is normoactive. Extremities have 1+ edema, full range of motion is present. Skin is warm and dry.  Scattered urticarial lesions are noted over the trunk. Neurologic: Mental status is normal, cranial nerves are intact, there are no motor or sensory deficits.  ED Results / Procedures / Treatments    Procedures Procedures  CRITICAL CARE Performed by: Delora Fuel Total critical care time: 35 minutes Critical care time was exclusive of separately billable procedures and treating other patients. Critical care was necessary to treat or prevent imminent or life-threatening deterioration. Critical care was time spent personally by me on the following activities: development of treatment plan with patient and/or surrogate as well as nursing, discussions with consultants, evaluation of patient's response to treatment, examination of patient, obtaining history from patient or surrogate, ordering and performing treatments and interventions, ordering and review of laboratory studies, ordering and review of radiographic studies, pulse oximetry and re-evaluation of patient's  condition.  Medications Ordered in ED Medications  diphenhydrAMINE (BENADRYL) injection 25 mg (has no administration in time range)  methylPREDNISolone sodium succinate (SOLU-MEDROL) 125 mg/2 mL injection 125 mg (has no administration in time range)  doxepin (SINEQUAN) capsule 10 mg (has no administration in time range)    ED Course  I have reviewed the triage vital signs and the nursing notes.  MDM Rules/Calculators/A&P                         Acute allergic reaction as part of the alpha gal syndrome.  He will be given additional diphenhydramine as well as methylprednisolone and doxepin.  He is currently hemodynamically stable and without any respiratory difficulty.  Old records are reviewed showing recent ED visit for allergic reaction.  7:19 AM Patient is feeling better.  No evidence  of any respiratory distress.  He is felt to be safe for discharge.  He is discharged with prescriptions for prednisone and doxepin.  Told to use over-the-counter antihistamines.  Return precautions discussed.  Final Clinical Impression(s) / ED Diagnoses Final diagnoses:  Allergic reaction to alpha-gal    Rx / DC Orders ED Discharge Orders         Ordered    predniSONE (DELTASONE) 50 MG tablet  Daily        09/25/20 0716    doxepin (SINEQUAN) 10 MG capsule  3 times daily        09/25/20 2947           Delora Fuel, MD 65/46/50 [phone]

## 2020-09-25 NOTE — ED Notes (Addendum)
Pt states improvement in hives and throat swelling. Pt had epi pen at home, 50mg  of benadryl, and 20mg  of pepcid

## 2020-09-25 NOTE — ED Triage Notes (Addendum)
Pt c/o hives, throat tightness, itching starting at 3am. States he thinks he ate something with dinner last night. Gave himself epi pen, 50mg  benadryl and 20mg  pepcid @ 315am prior to coming it. Muddy reports hives and chest tightness and GI symptoms

## 2020-11-05 DIAGNOSIS — Z85828 Personal history of other malignant neoplasm of skin: Secondary | ICD-10-CM | POA: Diagnosis not present

## 2020-11-05 DIAGNOSIS — L400 Psoriasis vulgaris: Secondary | ICD-10-CM | POA: Diagnosis not present

## 2020-11-05 DIAGNOSIS — D485 Neoplasm of uncertain behavior of skin: Secondary | ICD-10-CM | POA: Diagnosis not present

## 2020-11-05 DIAGNOSIS — C44311 Basal cell carcinoma of skin of nose: Secondary | ICD-10-CM | POA: Diagnosis not present

## 2020-11-05 DIAGNOSIS — L821 Other seborrheic keratosis: Secondary | ICD-10-CM | POA: Diagnosis not present

## 2020-11-27 ENCOUNTER — Emergency Department (HOSPITAL_COMMUNITY)
Admission: EM | Admit: 2020-11-27 | Discharge: 2020-11-28 | Disposition: A | Discharge status: Home / Self Care | Payer: Medicare Other | Attending: Emergency Medicine | Admitting: Emergency Medicine

## 2020-11-27 ENCOUNTER — Other Ambulatory Visit: Payer: Self-pay

## 2020-11-27 DIAGNOSIS — I10 Essential (primary) hypertension: Secondary | ICD-10-CM | POA: Insufficient documentation

## 2020-11-27 DIAGNOSIS — Z7982 Long term (current) use of aspirin: Secondary | ICD-10-CM | POA: Insufficient documentation

## 2020-11-27 DIAGNOSIS — L509 Urticaria, unspecified: Secondary | ICD-10-CM | POA: Insufficient documentation

## 2020-11-27 DIAGNOSIS — Z96642 Presence of left artificial hip joint: Secondary | ICD-10-CM | POA: Diagnosis not present

## 2020-11-27 DIAGNOSIS — J45909 Unspecified asthma, uncomplicated: Secondary | ICD-10-CM | POA: Diagnosis not present

## 2020-11-27 DIAGNOSIS — T7840XA Allergy, unspecified, initial encounter: Secondary | ICD-10-CM | POA: Diagnosis not present

## 2020-11-27 MED ORDER — METHYLPREDNISOLONE SODIUM SUCC 125 MG IJ SOLR
125.0000 mg | Freq: Once | INTRAMUSCULAR | Status: AC
  Administered 2020-11-27: 125 mg via INTRAVENOUS
  Filled 2020-11-27: qty 2

## 2020-11-27 NOTE — ED Triage Notes (Signed)
Pt arrived tonight to ER via pov for allergic reaction.  Pt with swollen lips, hives, gerd, feet and hands itching with hands swelling.  Pt reports itching in throat.  Pt states he used his epi-pen, 50mg  po benadryl, and 2 pepcid tablets.  Symptoms started around 5:30pm tonight.  Pt states he is allergic to red meat, but did not eat any red meat.

## 2020-11-27 NOTE — ED Provider Notes (Signed)
[facility] DEPT Provider Note   CSN: [id number] Arrival date & time: 11/27/20  2128     History Chief Complaint  Patient presents with   Allergic Reaction    Thomas Livingston is a 70 y.o. male with a history of alpha gal syndrome, hyperlipidemia, hypertension who presents to the emergency department with a chief complaint of allergic reaction.  The patient reports that at approximately 18:30 he developed sudden onset tightness in his chest, I throat closing, urticaria to his right hip and pelvic region, increased burping and belching, nausea.  He states that his bilateral hands began to feel swollen and more tight.  He developed itching to his bilateral palms and soles.  The patient's son noted that it looked as if his lips were swelling.  He states that the symptoms are consistent with previous allergic reactions.   His symptoms continue to gradually worsen so at 20:45 he took 2 tablets of Benadryl, 2 tablets of Pepcid, and used his EpiPen.  Since initiating treatment, symptoms have gradually improved, but have not resolved.  He no longer feels as if his throat is closing and chest tightness, urticaria, and GI symptoms have resolved.  He continues to feel some tightness in his lips, which he describes as feeling a rubber band around his mouth.  He denies difficulty swallowing, drooling, shortness of breath, vomiting, diarrhea, fever, chills, dizziness, lightheadedness, diaphoresis, syncope.  He reports that previously these reactions have been brought on by red meat.  He did have a [place] and cheese sandwich this afternoon and has consumed some milk, but denies any other concerning food products.  He has tolerated [place] many times without side effects.  No new soaps or lotions.  No new hygiene products.  No new sleeping environments.  No recent travel.  No known sick contacts.  No new medications or foods.  He does not take any blood pressure medications.  The  history is provided by the patient and medical records. No language interpreter was used.      Past Medical History:  Diagnosis Date   Allergy    Arthritis    left hip   Asthma    History of kidney stones    Hyperlipidemia    Hypertension 2012   no bp meds since 2012   Multiple lung nodules on CT 2007   no current follow up done   Psoriasis    both legs   Urticaria    Urticaria 03/31/2018    Patient Active Problem List   Diagnosis Date Noted   Solitary bone cyst of right forearm 01/26/2019   Ganglion cyst of flexor tendon sheath of finger of left hand 01/26/2019   Pain of right forearm 05/19/2018   Allergy to alpha-gal 05/18/2018   Asthma, well controlled 03/31/2018   Other allergic rhinitis 03/31/2018   Rectal mass 08/27/2014   Hypertension    Hyperlipidemia     Past Surgical History:  Procedure Laterality Date   CHOLECYSTECTOMY  2007   EUS N/A 06/28/2014   Procedure: LOWER ENDOSCOPIC ULTRASOUND (EUS);  Surgeon: Milus Banister, MD;  Location: [facility];  Service: Endoscopy;  Laterality: N/A;   EXTRACORPOREAL SHOCK WAVE LITHOTRIPSY N/A 03/21/2018   Procedure: EXTRACORPOREAL SHOCK WAVE LITHOTRIPSY (ESWL);  Surgeon: Irine Seal, MD;  Location: [facility];  Service: Urology;  Laterality: N/A;   HIP ARTHROPLASTY Left 2018   MOUTH SURGERY  1972   PROCTOSCOPY N/A 08/27/2014   Procedure: PROCTOSCOPY;  Surgeon: Leighton Ruff, MD;  Location: [facility];  Service: General;  Laterality: N/A;   TRANSANAL RECTAL RESECTION N/A 08/27/2014   Procedure: TRANSANAL RESECTION OF RECTAL TUMOR;  Surgeon: Leighton Ruff, MD;  Location: [facility];  Service: General;  Laterality: N/A;       Family History  Problem Relation Age of Onset   Thyroid cancer Mother    Lung cancer Father    Colon cancer Neg Hx     Social History   Tobacco Use   Smoking status: Never   Smokeless tobacco: Never  Vaping Use   Vaping Use: Never used  Substance Use Topics   Alcohol use: No   Drug use: No     Home Medications Prior to Admission medications   Medication Sig Start Date End Date Taking? Authorizing Provider  aspirin 81 MG tablet Take 81 mg by mouth daily.    [provider]  doxepin (SINEQUAN) 10 MG capsule Take 1 capsule (10 mg total) by mouth 3 (three) times daily. 10/29/28   Delora Fuel, MD  EPINEPHrine 0.3 mg/0.3 mL IJ SOAJ injection Inject 0.3 mg into the muscle as needed for anaphylaxis. 08/13/20   Isla Pence, MD  fluticasone Asencion Islam) 50 MCG/ACT nasal spray Use 2 sprays in each  nostril daily 01/10/14   Susy Frizzle, MD  Multiple Vitamin (MULTIVITAMIN) tablet Take 1 tablet by mouth daily.    [provider]  predniSONE (DELTASONE) 50 MG tablet Take 1 tablet (50 mg total) by mouth daily. 8/65/78   Delora Fuel, MD  rosuvastatin (CRESTOR) 10 MG tablet TAKE 1 TABLET BY MOUTH EVERY DAY 05/15/20   Susy Frizzle, MD  tamsulosin (FLOMAX) 0.4 MG CAPS capsule Take 0.4 mg by mouth daily. Patient not taking: Reported on 07/31/2020 04/12/19   [provider]    Allergies    Lotrel [amlodipine besy-benazepril hcl], Meat [alpha-gal], Other, and Chalk  Review of Systems   Review of Systems  Constitutional:  Negative for appetite change, chills, diaphoresis and fever.  HENT:  Positive for facial swelling and trouble swallowing. Negative for congestion, rhinorrhea, sinus pressure, sinus pain, sore throat and voice change.   Eyes:  Negative for visual disturbance.  Respiratory:  Positive for chest tightness. Negative for cough, shortness of breath and wheezing.   Cardiovascular:  Negative for chest pain, palpitations and leg swelling.  Gastrointestinal:  Positive for abdominal pain and nausea. Negative for blood in stool, constipation, diarrhea and vomiting.  Genitourinary:  Negative for dysuria and penile swelling.  Musculoskeletal:  Negative for arthralgias, back pain and myalgias.  Skin:  Positive for rash. Negative for color change and wound.        Pruritus  Allergic/Immunologic: Positive for food allergies. Negative for immunocompromised state.  Neurological:  Negative for seizures, syncope, speech difficulty, weakness, numbness and headaches.  Psychiatric/Behavioral:  Negative for confusion.    Physical Exam Updated Vital Signs BP (!) 143/81   Pulse 61   Temp 98.1 F (36.7 C) (Oral)   Resp 17   Ht 6\' 2"  (1.88 m)   Wt 103.4 kg   SpO2 94%   BMI 29.27 kg/m   Physical Exam Vitals and nursing note reviewed.  Constitutional:      General: He is not in acute distress.    Appearance: He is well-developed. He is not ill-appearing, toxic-appearing or diaphoretic.  HENT:     Head: Normocephalic.     Mouth/Throat:     Mouth: Mucous membranes are moist.     Comments: Upper and lower  lips are nontender to palpation.  No obvious edema.  Posterior oropharynx is patent.  Uvula is midline.  No sublingual edema.  Tolerating secretions without difficulty.  Normal phonation. Eyes:     Conjunctiva/sclera: Conjunctivae normal.  Cardiovascular:     Rate and Rhythm: Normal rate and regular rhythm.     Heart sounds: No murmur heard. Pulmonary:     Effort: Pulmonary effort is normal. No respiratory distress.     Breath sounds: No stridor. No wheezing, rhonchi or rales.     Comments: Lungs are clear to auscultation bilaterally.  No increased work of breathing. Chest:     Chest wall: No tenderness.  Abdominal:     General: There is no distension.     Palpations: Abdomen is soft. There is no mass.     Tenderness: There is no abdominal tenderness. There is no right CVA tenderness, left CVA tenderness, guarding or rebound.     Hernia: No hernia is present.     Comments: Abdomen is soft, nontender, nondistended.  Normoactive bowel sounds.  Musculoskeletal:     Cervical back: Neck supple.  Skin:    General: Skin is warm and dry.     Coloration: Skin is not jaundiced or pale.     Findings: No erythema or rash.     Comments: No rashes, wheals,  urticaria, or other lesions noted to the trunk, extremities, or face.  Neurological:     Mental Status: He is alert.  Psychiatric:        Behavior: Behavior normal.    ED Results / Procedures / Treatments   Labs (all labs ordered are listed, but only abnormal results are displayed) Labs Reviewed - No data to display  EKG None  Radiology No results found.  Procedures Procedures   Medications Ordered in ED Medications - No data to display  ED Course  I have reviewed the triage vital signs and the nursing notes.  Pertinent labs & imaging results that were available during my care of the patient were reviewed by me and considered in my medical decision making (see chart for details).    MDM Rules/Calculators/A&P                           71 year old male with a history of alpha gal syndrome, hyperlipidemia, hypertension who presents the emergency department with a chief complaint of allergic reaction.  The patient developed sudden onset chest tightness, urticarial rash, lip swelling, throat closing, nausea, upper abdominal pain, increased burping and belching, itching to his palms and soles, and swelling to his bilateral hands at 18:30. He used his EpiPen at home at [facility] and 60 tablets of Benadryl and Pepcid.  Symptoms have gradually improved since treatment.  He is unsure what precipitated his episode today as typically his symptoms have involved red meat given his history of alpha gal syndrome.  He did eat [place] today, but denies triggers previously with [place].  He does have a history of hypertension, but per chart review does not appear to be on any current antihypertensive medications.  Vital signs are stable.  Lungs are clear to auscultation bilaterally with no increased work of breathing.  I do not appreciate any swelling of the oropharynx.  Abdomen is benign.  Differential diagnosis could include ACS, PE, asthma exacerbation, but presentation is most consistent with allergic  reaction and patient states that his symptoms are consistent with previous allergic reactions.  Will give Solu-Medrol and  continue to observe for 4 hours after EpiPen administration.  After 4-hour observation period, patient was rechecked.  He reports that all of his symptoms have resolved except for some itching to his palms and soles.  Vital signs have remained stable.  We will discharge the patient home with a short course of prednisone and recommended Pepcid and Benadryl for symptomatic management.  His home EpiPen has been refilled.  He has been encouraged to follow-up with his allergy/immunologist.  He is hemodynamically stable and in no acute distress.  Safer discharge to home with outpatient follow-up as discussed.  ER return precautions given.  Final Clinical Impression(s) / ED Diagnoses Final diagnoses:  None    Rx / DC Orders ED Discharge Orders     None        Joanne Gavel, PA-C 11/28/20 Larkin Ina, MD 11/28/20 [phone]

## 2020-11-27 NOTE — ED Notes (Signed)
Pt reports having 50mg  benedryl epi pen and 2 pepcid

## 2020-11-28 MED ORDER — PREDNISONE 10 MG PO TABS: ORAL_TABLET | ORAL | 0 refills | Status: AC

## 2020-11-28 MED ORDER — EPINEPHRINE 0.3 MG/0.3ML IJ SOAJ: 0.3000 mg | INTRAMUSCULAR | 1 refills | Status: AC | PRN

## 2020-11-28 NOTE — Discharge Instructions (Addendum)
Thank you for allowing me to care for you today in the Emergency Department.   You were seen today for an allergic reaction.  Use your EpiPen at home and you were monitored for 4 hours in the emergency department after EpiPen usage.  Your symptoms continue to improve.  I have refilled your home EpiPen.  Starting tomorrow, take prednisone as prescribed.  You were given a dose of steroids tonight in the emergency department.  You can continue to take Benadryl and Pepcid at home as needed for your symptoms.  Continue to follow-up with your allergist and immunologist for further evaluation.  Return to the emergency department if you pass out, if you develop respiratory distress, if you feel as if your throat is closing, if your tongue or your lips become significantly more swollen, if you require usage of your EpiPen again in the future, or if you develop other new, concerning symptoms.

## 2020-12-03 DIAGNOSIS — Z85828 Personal history of other malignant neoplasm of skin: Secondary | ICD-10-CM | POA: Diagnosis not present

## 2020-12-03 DIAGNOSIS — C44311 Basal cell carcinoma of skin of nose: Secondary | ICD-10-CM | POA: Diagnosis not present

## 2020-12-06 DIAGNOSIS — Z20822 Contact with and (suspected) exposure to covid-19: Secondary | ICD-10-CM | POA: Diagnosis not present

## 2021-01-16 DIAGNOSIS — H1045 Other chronic allergic conjunctivitis: Secondary | ICD-10-CM | POA: Diagnosis not present

## 2021-01-16 DIAGNOSIS — H0102A Squamous blepharitis right eye, upper and lower eyelids: Secondary | ICD-10-CM | POA: Diagnosis not present

## 2021-01-16 DIAGNOSIS — H2513 Age-related nuclear cataract, bilateral: Secondary | ICD-10-CM | POA: Diagnosis not present

## 2021-01-16 DIAGNOSIS — H0102B Squamous blepharitis left eye, upper and lower eyelids: Secondary | ICD-10-CM | POA: Diagnosis not present

## 2021-02-14 DIAGNOSIS — H90A31 Mixed conductive and sensorineural hearing loss, unilateral, right ear with restricted hearing on the contralateral side: Secondary | ICD-10-CM | POA: Diagnosis not present

## 2021-02-14 DIAGNOSIS — H90A22 Sensorineural hearing loss, unilateral, left ear, with restricted hearing on the contralateral side: Secondary | ICD-10-CM | POA: Diagnosis not present

## 2021-02-17 DIAGNOSIS — J029 Acute pharyngitis, unspecified: Secondary | ICD-10-CM | POA: Diagnosis not present

## 2021-02-17 DIAGNOSIS — R519 Headache, unspecified: Secondary | ICD-10-CM | POA: Diagnosis not present

## 2021-02-17 DIAGNOSIS — Z20822 Contact with and (suspected) exposure to covid-19: Secondary | ICD-10-CM | POA: Diagnosis not present

## 2021-02-17 DIAGNOSIS — R059 Cough, unspecified: Secondary | ICD-10-CM | POA: Diagnosis not present

## 2021-02-17 DIAGNOSIS — J189 Pneumonia, unspecified organism: Secondary | ICD-10-CM | POA: Diagnosis not present

## 2021-02-17 DIAGNOSIS — R918 Other nonspecific abnormal finding of lung field: Secondary | ICD-10-CM | POA: Diagnosis not present

## 2021-02-18 ENCOUNTER — Ambulatory Visit: Payer: Medicare Other | Admitting: Family Medicine

## 2021-02-25 ENCOUNTER — Encounter: Payer: Self-pay | Admitting: Family Medicine

## 2021-02-25 ENCOUNTER — Ambulatory Visit (INDEPENDENT_AMBULATORY_CARE_PROVIDER_SITE_OTHER): Payer: Medicare Other | Admitting: Family Medicine

## 2021-02-25 ENCOUNTER — Other Ambulatory Visit: Payer: Self-pay

## 2021-02-25 VITALS — BP 130/64 | HR 53 | Temp 98.7°F | Resp 20 | Ht 74.0 in | Wt 232.0 lb

## 2021-02-25 DIAGNOSIS — J208 Acute bronchitis due to other specified organisms: Secondary | ICD-10-CM

## 2021-02-25 MED ORDER — PREDNISONE 20 MG PO TABS: ORAL_TABLET | ORAL | 0 refills | Status: DC

## 2021-02-25 MED ORDER — ALBUTEROL SULFATE HFA 108 (90 BASE) MCG/ACT IN AERS: 2.0000 | INHALATION_SPRAY | Freq: Four times a day (QID) | RESPIRATORY_TRACT | 0 refills | Status: DC | PRN

## 2021-02-25 NOTE — Progress Notes (Signed)
Subjective:    Patient ID: Thomas Livingston, male    DOB: [birth year], 70 y.o.   MRN: [id number]  Patient was recently seen in urgent care on October 10 and was diagnosed with pneumonia per his report.  He was given a Z-Pak as well as Decadron.  He is here today for follow-up.  He states he is doing better.  He continues to cough and occasionally wheeze at night.  He is using albuterol at night to help with the wheezing and he sees substantial benefit.  Yesterday was the first day he has not required the albuterol.  He is gradually starting to feel better.  His energy level is improving.  He denies any shortness of breath or chest pain or purulent sputum or hemoptysis.  He denies any pleurisy or fever or chills.  He does still have postnasal drip and rhinorrhea Past Medical History:  Diagnosis Date   Allergy    Arthritis    left hip   Asthma    History of kidney stones    Hyperlipidemia    Hypertension 2012   no bp meds since 2012   Multiple lung nodules on CT 2007   no current follow up done   Psoriasis    both legs   Urticaria    Urticaria 03/31/2018   Past Surgical History:  Procedure Laterality Date   CHOLECYSTECTOMY  2007   EUS N/A 06/28/2014   Procedure: LOWER ENDOSCOPIC ULTRASOUND (EUS);  Surgeon: Milus Banister, MD;  Location: [facility];  Service: Endoscopy;  Laterality: N/A;   EXTRACORPOREAL SHOCK WAVE LITHOTRIPSY N/A 03/21/2018   Procedure: EXTRACORPOREAL SHOCK WAVE LITHOTRIPSY (ESWL);  Surgeon: Irine Seal, MD;  Location: [facility];  Service: Urology;  Laterality: N/A;   HIP ARTHROPLASTY Left 2018   MOUTH SURGERY  1972   PROCTOSCOPY N/A 08/27/2014   Procedure: PROCTOSCOPY;  Surgeon: Leighton Ruff, MD;  Location: [facility];  Service: General;  Laterality: N/A;   TRANSANAL RECTAL RESECTION N/A 08/27/2014   Procedure: TRANSANAL RESECTION OF RECTAL TUMOR;  Surgeon: Leighton Ruff, MD;  Location: [facility];  Service: General;  Laterality: N/A;   Current Outpatient Medications on File  Prior to Visit  Medication Sig Dispense Refill   aspirin 81 MG tablet Take 81 mg by mouth daily.     EPINEPHrine 0.3 mg/0.3 mL IJ SOAJ injection Inject 0.3 mg into the muscle as needed for anaphylaxis. 2 each 1   fluticasone (FLONASE) 50 MCG/ACT nasal spray Use 2 sprays in each  nostril daily 48 g 3   Multiple Vitamin (MULTIVITAMIN) tablet Take 1 tablet by mouth daily.     rosuvastatin (CRESTOR) 10 MG tablet TAKE 1 TABLET BY MOUTH EVERY DAY 90 tablet 3   doxepin (SINEQUAN) 10 MG capsule Take 1 capsule (10 mg total) by mouth 3 (three) times daily. (Patient not taking: Reported on 02/25/2021) 21 capsule 0   No current facility-administered medications on file prior to visit.   Allergies  Allergen Reactions   Lotrel [Amlodipine Besy-Benazepril Hcl] Hives and Swelling   Meat [Alpha-Gal]    Other Swelling    lycra    Chalk Other (See Comments)    sneezing   Social History   Socioeconomic History   Marital status: Married    Spouse name: Not on file   Number of children: Not on file   Years of education: Not on file   Highest education level: Not on file  Occupational History   Not on file  Tobacco  Use   Smoking status: Never   Smokeless tobacco: Never  Vaping Use   Vaping Use: Never used  Substance and Sexual Activity   Alcohol use: No   Drug use: No   Sexual activity: Not on file  Other Topics Concern   Not on file  Social History Narrative   Not on file   Social Determinants of Health   Financial Resource Strain: Not on file  Food Insecurity: Not on file  Transportation Needs: Not on file  Physical Activity: Not on file  Stress: Not on file  Social Connections: Not on file  Intimate Partner Violence: Not on file   Family History  Problem Relation Age of Onset   Thyroid cancer Mother    Lung cancer Father    Colon cancer Neg Hx       Review of Systems  All other systems reviewed and are negative.     Objective:   Physical Exam Vitals reviewed.   Constitutional:      General: He is not in acute distress.    Appearance: He is well-developed. He is not diaphoretic.  HENT:     Head: Normocephalic and atraumatic.     Right Ear: External ear normal.     Left Ear: External ear normal.     Nose: Rhinorrhea present.     Mouth/Throat:     Pharynx: No oropharyngeal exudate.  Eyes:     General: No scleral icterus.       Right eye: No discharge.        Left eye: No discharge.     Conjunctiva/sclera: Conjunctivae normal.     Pupils: Pupils are equal, round, and reactive to light.  Neck:     Thyroid: No thyromegaly.     Vascular: No JVD.     Trachea: No tracheal deviation.  Cardiovascular:     Rate and Rhythm: Normal rate and regular rhythm.     Heart sounds: Normal heart sounds. No murmur heard.   No friction rub. No gallop.  Pulmonary:     Effort: Pulmonary effort is normal. No respiratory distress.     Breath sounds: No stridor. Wheezing present. No rales.  Chest:     Chest wall: No tenderness.  Abdominal:     General: Bowel sounds are normal. There is no distension.     Palpations: Abdomen is soft. There is no mass.     Tenderness: There is no abdominal tenderness. There is no guarding or rebound.  Musculoskeletal:        General: No tenderness or deformity. Normal range of motion.     Cervical back: Normal range of motion and neck supple.  Lymphadenopathy:     Cervical: No cervical adenopathy.  Skin:    General: Skin is warm.     Coloration: Skin is not pale.     Findings: No erythema or rash.  Neurological:     Mental Status: He is alert and oriented to person, place, and time.     Cranial Nerves: No cranial nerve deficit.     Motor: No abnormal muscle tone.     Coordination: Coordination normal.     Deep Tendon Reflexes: Reflexes are normal and symmetric.  Psychiatric:        Behavior: Behavior normal.        Thought Content: Thought content normal.        Judgment: Judgment normal.          Assessment &  Plan:  Acute  bronchitis due to other specified organisms I feel the patient likely had viral bronchitis or bacterial bronchitis.  This is gradually responding to Zithromax.  He is using the albuterol sparingly as needed.  If his wheezing worsens, he can start a prednisone taper pack.  Otherwise continue to use the albuterol every 6 hours as needed and allow tincture of time.  Patient states that he feels much better.

## 2021-03-12 DIAGNOSIS — R062 Wheezing: Secondary | ICD-10-CM | POA: Diagnosis not present

## 2021-03-12 DIAGNOSIS — J189 Pneumonia, unspecified organism: Secondary | ICD-10-CM | POA: Diagnosis not present

## 2021-03-19 ENCOUNTER — Ambulatory Visit (INDEPENDENT_AMBULATORY_CARE_PROVIDER_SITE_OTHER): Payer: Medicare Other | Admitting: Nurse Practitioner

## 2021-03-19 ENCOUNTER — Other Ambulatory Visit: Payer: Self-pay

## 2021-03-19 ENCOUNTER — Encounter: Payer: Self-pay | Admitting: Nurse Practitioner

## 2021-03-19 VITALS — BP 140/82 | HR 58 | Temp 97.5°F | Wt 227.4 lb

## 2021-03-19 DIAGNOSIS — R918 Other nonspecific abnormal finding of lung field: Secondary | ICD-10-CM | POA: Diagnosis not present

## 2021-03-19 NOTE — Progress Notes (Signed)
Subjective:    Patient ID: Thomas Livingston, male    DOB: [birth year], 70 y.o.   MRN: [id number]  HPI: AHMOD GILLESPIE is a 70 y.o. male presenting for pneumonia follow up.  Chief Complaint  Patient presents with   Follow-up    Pneumonia seen at urgent care    COUGH Started on Levaquin and prednisone by urgent care on 11/2.  1 month prior to that, treated with Zpack and prednisone  for pneumonia.  Reports symptoms have improved but are still persistent.  Tells me he was diagnosed with sarcoidosis about 15 years ago. Duration: 1 month Circumstances of initial development of cough: URI Cough severity: better  - moderate  Cough description: productive; light yellow - clear thick Aggravating factors: moving around, laying down Alleviating factors: nothing; cough drops,  Status: better Treatments attempted: cough drops, hydration, Tylenol cold/flu, albuterol inhaler  Wheezing: yes Shortness of breath: no Exertional shortness of breath: yes Chest pain: no Chest tightness: no Nasal congestion: no; flonase has been helping Runny nose: no Postnasal drip: yes Frequent throat clearing or swallowing: yes Hemoptysis: no Fevers: no Night sweats: no Decreased appetite: yes; feels tired Weight loss: no Heartburn: no Recent foreign travel: no Tuberculosis contacts: no  Allergies  Allergen Reactions   Nylon Rash   Lotrel [Amlodipine Besy-Benazepril Hcl] Hives and Swelling   Meat [Alpha-Gal]    Other Swelling    lycra    Chalk Other (See Comments)    sneezing    Outpatient Encounter Medications as of 03/19/2021  Medication Sig   albuterol (VENTOLIN HFA) 108 (90 Base) MCG/ACT inhaler Inhale 2 puffs into the lungs every 6 (six) hours as needed for wheezing or shortness of breath.   aspirin 81 MG tablet Take 81 mg by mouth daily.   EPINEPHrine 0.3 mg/0.3 mL IJ SOAJ injection Inject 0.3 mg into the muscle as needed for anaphylaxis.   fluticasone (FLONASE) 50 MCG/ACT nasal spray Use  2 sprays in each  nostril daily   Multiple Vitamin (MULTIVITAMIN) tablet Take 1 tablet by mouth daily.   predniSONE (DELTASONE) 20 MG tablet 3 tabs poqday 1-2, 2 tabs poqday 3-4, 1 tab poqday 5-6   rosuvastatin (CRESTOR) 10 MG tablet TAKE 1 TABLET BY MOUTH EVERY DAY   [DISCONTINUED] azithromycin (ZITHROMAX) 250 MG tablet Take two tablets on the first day and then one tablet every day after. (Patient not taking: Reported on 03/19/2021)   [DISCONTINUED] doxepin (SINEQUAN) 10 MG capsule Take 1 capsule (10 mg total) by mouth 3 (three) times daily. (Patient not taking: No sig reported)   [DISCONTINUED] rosuvastatin (CRESTOR) 10 MG tablet Take 1 tablet by mouth daily. (Patient not taking: Reported on 03/19/2021)   No facility-administered encounter medications on file as of 03/19/2021.    Patient Active Problem List   Diagnosis Date Noted   Solitary bone cyst of right forearm 01/26/2019   Ganglion cyst of flexor tendon sheath of finger of left hand 01/26/2019   Pain of right forearm 05/19/2018   Allergy to alpha-gal 05/18/2018   Asthma, well controlled 03/31/2018   Other allergic rhinitis 03/31/2018   Rectal mass 08/27/2014   Hypertension    Hyperlipidemia     Past Medical History:  Diagnosis Date   Allergy    Arthritis    left hip   Asthma    History of kidney stones    Hyperlipidemia    Hypertension 2012   no bp meds since 2012   Multiple lung nodules on  CT 2007   no current follow up done   Psoriasis    both legs   Urticaria    Urticaria 03/31/2018    Relevant past medical, surgical, family and social history reviewed and updated as indicated. Interim medical history since our last visit reviewed.  Review of Systems Per HPI unless specifically indicated above     Objective:    BP 140/82   Pulse (!) 58   Temp (!) 97.5 F (36.4 C)   Wt 227 lb 6.4 oz (103.1 kg)   SpO2 98%   BMI 29.20 kg/m   Wt Readings from Last 3 Encounters:  03/19/21 227 lb 6.4 oz (103.1 kg)   02/25/21 232 lb (105.2 kg)  11/27/20 228 lb (103.4 kg)    Physical Exam Vitals and nursing note reviewed.  Constitutional:      General: He is not in acute distress.    Appearance: Normal appearance. He is not toxic-appearing.  HENT:     Head: Normocephalic and atraumatic.  Eyes:     General: No scleral icterus.    Extraocular Movements: Extraocular movements intact.  Cardiovascular:     Rate and Rhythm: Normal rate and regular rhythm.     Heart sounds: Normal heart sounds. No murmur heard. Pulmonary:     Effort: Pulmonary effort is normal. No respiratory distress.     Breath sounds: No stridor. Wheezing present. No rhonchi or rales.  Musculoskeletal:     Right lower leg: No edema.     Left lower leg: No edema.  Skin:    General: Skin is warm and dry.     Coloration: Skin is not jaundiced or pale.     Findings: No erythema.  Neurological:     Mental Status: He is alert and oriented to person, place, and time.  Psychiatric:        Mood and Affect: Mood normal.        Behavior: Behavior normal.        Thought Content: Thought content normal.        Judgment: Judgment normal.      Assessment & Plan:  1. Opacities of both lungs present on chest x-ray Patient has been treated with 2 rounds of antibiotics and prednisone and is still symptomatic.  We did discuss that a cough can last for up to 6 weeks, however given his wheezing, shortness of breath with minimal exertion, and reported history, I would like to obtain further imaging with a CT scan of his chest.  Start expectorant like Mucinex to help with congestion and push hydration with water.  Follow up if symptoms worsen.  - CT Chest W Contrast; Future     Follow up plan: Return if symptoms worsen or fail to improve.

## 2021-03-30 DIAGNOSIS — S61311A Laceration without foreign body of left index finger with damage to nail, initial encounter: Secondary | ICD-10-CM | POA: Diagnosis not present

## 2021-04-07 ENCOUNTER — Other Ambulatory Visit: Payer: Self-pay

## 2021-04-07 MED ORDER — ALBUTEROL SULFATE HFA 108 (90 BASE) MCG/ACT IN AERS: 2.0000 | INHALATION_SPRAY | Freq: Four times a day (QID) | RESPIRATORY_TRACT | 0 refills | Status: AC | PRN

## 2021-04-08 DIAGNOSIS — R040 Epistaxis: Secondary | ICD-10-CM | POA: Diagnosis not present

## 2021-04-08 DIAGNOSIS — Z743 Need for continuous supervision: Secondary | ICD-10-CM | POA: Diagnosis not present

## 2021-04-08 DIAGNOSIS — R58 Hemorrhage, not elsewhere classified: Secondary | ICD-10-CM | POA: Diagnosis not present

## 2021-04-08 DIAGNOSIS — E785 Hyperlipidemia, unspecified: Secondary | ICD-10-CM | POA: Diagnosis not present

## 2021-04-11 ENCOUNTER — Ambulatory Visit
Admission: RE | Admit: 2021-04-11 | Discharge: 2021-04-11 | Disposition: A | Discharge status: Home / Self Care | Payer: Medicare Other | Source: Ambulatory Visit | Attending: Nurse Practitioner | Admitting: Nurse Practitioner

## 2021-04-11 ENCOUNTER — Telehealth: Payer: Self-pay | Admitting: Nurse Practitioner

## 2021-04-11 DIAGNOSIS — J929 Pleural plaque without asbestos: Secondary | ICD-10-CM | POA: Diagnosis not present

## 2021-04-11 DIAGNOSIS — D869 Sarcoidosis, unspecified: Secondary | ICD-10-CM

## 2021-04-11 DIAGNOSIS — J9811 Atelectasis: Secondary | ICD-10-CM | POA: Diagnosis not present

## 2021-04-11 DIAGNOSIS — I700 Atherosclerosis of aorta: Secondary | ICD-10-CM | POA: Diagnosis not present

## 2021-04-11 DIAGNOSIS — I2510 Atherosclerotic heart disease of native coronary artery without angina pectoris: Secondary | ICD-10-CM | POA: Diagnosis not present

## 2021-04-11 DIAGNOSIS — R918 Other nonspecific abnormal finding of lung field: Secondary | ICD-10-CM

## 2021-04-11 DIAGNOSIS — R040 Epistaxis: Secondary | ICD-10-CM

## 2021-04-11 MED ORDER — TIOTROPIUM BROMIDE MONOHYDRATE 1.25 MCG/ACT IN AERS: 1.2500 ug | INHALATION_SPRAY | Freq: Every day | RESPIRATORY_TRACT | 2 refills | Status: DC

## 2021-04-11 MED ORDER — IOPAMIDOL (ISOVUE-300) INJECTION 61%
75.0000 mL | Freq: Once | INTRAVENOUS | Status: AC | PRN
  Administered 2021-04-11: 75 mL via INTRAVENOUS

## 2021-04-11 NOTE — Telephone Encounter (Signed)
Called patient to discuss CT scan results.  Patient reports cough has improved.  He is aware of being diagnosed with sarcoidosis years ago.  Agreeable to start daily inhaler and referral to pulmonary.    While on the phone, patient reports recent severe nose bleed.  He had to call EMS, however once EMS got him to the hospital, the bleeding stopped without intervention.  He would like referral to ENT to have evaluation to make sure this does not recur.  Referral placed per request.

## 2021-04-21 DIAGNOSIS — R051 Acute cough: Secondary | ICD-10-CM | POA: Diagnosis not present

## 2021-04-21 DIAGNOSIS — R0981 Nasal congestion: Secondary | ICD-10-CM | POA: Diagnosis not present

## 2021-04-21 DIAGNOSIS — Z20822 Contact with and (suspected) exposure to covid-19: Secondary | ICD-10-CM | POA: Diagnosis not present

## 2021-04-21 NOTE — Progress Notes (Signed)
Synopsis: Referred for sarcoidosis by Eulogio Bear, NP  Subjective:   PATIENT ID: Thomas Livingston GENDER: male DOB: 1950-10-19, MRN: [id number]  Chief Complaint  Patient presents with   Consult    Pt states having trouble breathing keep getting lung infection   70yM with history of sarcoidosis, asthma, alpha gal allergy followed by Dr. Neldon Mc, allergy, pulmonary nodules, HTN who is referred for recurring lung infection wondering if he has pneumonia, frequently coughing up phlegm, plugs.   He thinks he picked up a cold again from a sick contact. Has had 2 courses of steroids/ABX with some improvement after each course but not back to baseline. On third round of ABX currently taking augmentin and azithromycin. Hasn't yet tried spiriva inhaler.   Was diagnosed with sarcoid in 2007 based on hilar adenopathy but never had biopsy. 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 He never was treated for sarcoid, never was symptomatic.  Otherwise pertinent review of systems is negative.  Has a brother that died of pulmonary fibrosis that he attributes to agent orange exposure. Wife has sarcoid.  Did work for [employer] and didn't wear mask when working. Never smoked. Has never lived outside of [place]. No pets at home. He hunts and dresses deer.   Past Medical History:  Diagnosis Date   Allergy    Arthritis    left hip   Asthma    History of kidney stones    Hyperlipidemia    Hypertension 2012   no bp meds since 2012   Multiple lung nodules on CT 2007   no current follow up done   Psoriasis    both legs   Urticaria    Urticaria 03/31/2018     Family History  Problem Relation Age of Onset   Thyroid cancer Mother    Lung cancer Father    Colon cancer Neg Hx      Past Surgical History:  Procedure Laterality Date   CHOLECYSTECTOMY  2007   EUS N/A 06/28/2014   Procedure: LOWER ENDOSCOPIC ULTRASOUND (EUS);   Surgeon: Milus Banister, MD;  Location: [facility];  Service: Endoscopy;  Laterality: N/A;   EXTRACORPOREAL SHOCK WAVE LITHOTRIPSY N/A 03/21/2018   Procedure: EXTRACORPOREAL SHOCK WAVE LITHOTRIPSY (ESWL);  Surgeon: Irine Seal, MD;  Location: [facility];  Service: Urology;  Laterality: N/A;   HIP ARTHROPLASTY Left 2018   MOUTH SURGERY  1972   PROCTOSCOPY N/A 08/27/2014   Procedure: PROCTOSCOPY;  Surgeon: Leighton Ruff, MD;  Location: [facility];  Service: General;  Laterality: N/A;   TRANSANAL RECTAL RESECTION N/A 08/27/2014   Procedure: TRANSANAL RESECTION OF RECTAL TUMOR;  Surgeon: Leighton Ruff, MD;  Location: [facility];  Service: General;  Laterality: N/A;    Social History   Socioeconomic History   Marital status: Married    Spouse name: Not on file   Number of children: Not on file   Years of education: Not on file   Highest education level: Not on file  Occupational History   Not on file  Tobacco Use   Smoking status: Never   Smokeless tobacco: Never  Vaping Use   Vaping Use: Never used  Substance and Sexual Activity   Alcohol use: No   Drug use: No   Sexual activity: Not on file  Other Topics Concern   Not on file  Social History Narrative   Not on file   Social Determinants  of Health  ° °Financial Resource Strain: Not on file  °Food Insecurity: Not on file  °Transportation Needs: Not on file  °Physical Activity: Not on file  °Stress: Not on file  °Social Connections: Not on file  °Intimate Partner Violence: Not on file  °  ° °Allergies  °Allergen Reactions  ° Nylon Rash  ° Lotrel [Amlodipine Besy-Benazepril Hcl] Hives and Swelling  ° Meat [Alpha-Gal]   ° Other Swelling  °  lycra   ° Chalk Other (See Comments)  °  sneezing  °  ° °Outpatient Medications Prior to Visit  °Medication Sig Dispense Refill  ° albuterol (VENTOLIN HFA) 108 (90 Base) MCG/ACT inhaler Inhale 2 puffs into the lungs every 6 (six) hours as needed for wheezing or shortness of breath. 8 g 0  ° amoxicillin-clavulanate  (AUGMENTIN) 875-125 MG tablet Take by mouth.    ° azithromycin (ZITHROMAX) 250 MG tablet Take 250 mg by mouth as directed.    ° EPINEPHrine 0.3 mg/0.3 mL IJ SOAJ injection Inject 0.3 mg into the muscle as needed for anaphylaxis. 2 each 1  ° fexofenadine (ALLEGRA) 180 MG tablet Take 180 mg by mouth daily.    ° fluticasone (FLONASE) 50 MCG/ACT nasal spray Use 2 sprays in each  nostril daily 48 g 3  ° Multiple Vitamin (MULTIVITAMIN) tablet Take 1 tablet by mouth daily.    ° rosuvastatin (CRESTOR) 10 MG tablet TAKE 1 TABLET BY MOUTH EVERY DAY 90 tablet 3  ° Tiotropium Bromide Monohydrate 1.25 MCG/ACT AERS Inhale 1.25 mcg into the lungs daily. 1 each 2  ° zinc gluconate 50 MG tablet Take 50 mg by mouth daily.    ° aspirin 81 MG tablet Take 81 mg by mouth daily. (Patient not taking: Reported on 04/22/2021)    ° predniSONE (DELTASONE) 20 MG tablet 3 tabs poqday 1-2, 2 tabs poqday 3-4, 1 tab poqday 5-6 (Patient not taking: Reported on 04/22/2021) 12 tablet 0  ° °No facility-administered medications prior to visit.  ° ° ° ° ° °Objective:  ° °Physical Exam: ° °General appearance: 70 y.o., male, NAD, conversant  °Eyes: anicteric sclerae; PERRL, tracking appropriately °HENT: NCAT; MMM °Neck: Trachea midline; no lymphadenopathy, no JVD °Lungs: Wheeze bilaterally, bronchial breath sounds diffusely, with normal respiratory effort °CV: RRR, no murmur  °Abdomen: Soft, non-tender; non-distended, BS present  °Extremities: No peripheral edema, warm °Skin: Normal turgor and texture; no rash °Psych: Appropriate affect °Neuro: Alert and oriented to person and place, no focal deficit  ° ° ° °Vitals:  ° 04/22/21 0958  °BP: 122/76  °Pulse: 65  °Temp: 98 °F (36.7 °C)  °TempSrc: Oral  °SpO2: 93%  °Weight: 230 lb (104.3 kg)  °Height: 6' 2" (1.88 m)  ° °93% on RA °BMI Readings from Last 3 Encounters:  °04/22/21 29.53 kg/m²  °03/19/21 29.20 kg/m²  °02/25/21 29.79 kg/m²  ° °Wt Readings from Last 3 Encounters:  °04/22/21 230 lb (104.3 kg)   °03/19/21 227 lb 6.4 oz (103.1 kg)  °02/25/21 232 lb (105.2 kg)  ° ° ° °CBC °   °Component Value Date/Time  ° WBC 7.4 07/31/2020 0857  ° RBC 4.62 07/31/2020 0857  ° HGB 14.4 07/31/2020 0857  ° HGB 15.5 09/01/2017 1048  ° HCT 43.1 07/31/2020 0857  ° HCT 45.6 09/01/2017 1048  ° PLT 258 07/31/2020 0857  ° PLT 252 09/01/2017 1048  ° MCV 93.3 07/31/2020 0857  ° MCV 91 09/01/2017 1048  ° MCH 31.2 07/31/2020 0857  ° MCHC 33.4 07/31/2020 0857  °   RDW 12.3 07/31/2020 0857   RDW 13.9 09/01/2017 1048   LYMPHSABS 977 07/31/2020 0857   LYMPHSABS 1.5 09/01/2017 1048   MONOABS 1,092 (H) 02/20/2016 1249   EOSABS 496 07/31/2020 0857   EOSABS 0.4 09/01/2017 1048   BASOSABS 59 07/31/2020 0857   BASOSABS 0.0 09/01/2017 1048    Chest Imaging: CT Chest 04/11/2021 reviewed by me remarkable for calcified mediastinal LAD, bronchovascular thickening and bronchial stenosis, pulmonary nodules. Airways disease has progressed significantly and nodes are now calcified to greater extent  Pulmonary Functions Testing Results: No flowsheet data found.      Assessment & Plan:   # Mediastinal LAD # Bronchial wall thickening and bronchial stenosis # Multiple pulmonary nodules Concerned that he has active sarcoidosis and/or fibrosing mediastinitis. He may have superimposed atypical infection but my suspicion is relatively low for this.  Plan: - EBUS, bronch and BAL under general - consideration of steroid course after reviewing results - tentatively plan on clinic visit in January with PFTs   I spent 62 minutes dedicated to the care of this patient on the date of this encounter to include pre-visit review of records, face-to-face time with the patient discussing conditions above, post visit ordering of testing, clinical documentation with the electronic health record, making appropriate referrals as documented, and communicating necessary findings to members of the patients care team.    Maryjane Hurter, MD [place]  [facility] 04/22/2021 12:49 PM

## 2021-04-21 NOTE — H&P (View-Only) (Signed)
Synopsis: Referred for sarcoidosis by Eulogio Bear, NP  Subjective:   PATIENT ID: Thomas Livingston GENDER: male DOB: 1951-01-17, MRN: [id number]  Chief Complaint  Patient presents with   Consult    Pt states having trouble breathing keep getting lung infection   70yM with history of sarcoidosis, asthma, alpha gal allergy followed by Dr. Neldon Mc, allergy, pulmonary nodules, HTN who is referred for recurring lung infection wondering if he has pneumonia, frequently coughing up phlegm, plugs.   He thinks he picked up a cold again from a sick contact. Has had 2 courses of steroids/ABX with some improvement after each course but not back to baseline. On third round of ABX currently taking augmentin and azithromycin. Hasn't yet tried spiriva inhaler.   Was diagnosed with sarcoid in 2007 based on hilar adenopathy but never had biopsy. 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 He never was treated for sarcoid, never was symptomatic.  Otherwise pertinent review of systems is negative.  Has a brother that died of pulmonary fibrosis that he attributes to agent orange exposure. Wife has sarcoid.  Did work for [employer] and didn't wear mask when working. Never smoked. Has never lived outside of [place]. No pets at home. He hunts and dresses deer.   Past Medical History:  Diagnosis Date   Allergy    Arthritis    left hip   Asthma    History of kidney stones    Hyperlipidemia    Hypertension 2012   no bp meds since 2012   Multiple lung nodules on CT 2007   no current follow up done   Psoriasis    both legs   Urticaria    Urticaria 03/31/2018     Family History  Problem Relation Age of Onset   Thyroid cancer Mother    Lung cancer Father    Colon cancer Neg Hx      Past Surgical History:  Procedure Laterality Date   CHOLECYSTECTOMY  2007   EUS N/A 06/28/2014   Procedure: LOWER ENDOSCOPIC ULTRASOUND (EUS);   Surgeon: Milus Banister, MD;  Location: [facility];  Service: Endoscopy;  Laterality: N/A;   EXTRACORPOREAL SHOCK WAVE LITHOTRIPSY N/A 03/21/2018   Procedure: EXTRACORPOREAL SHOCK WAVE LITHOTRIPSY (ESWL);  Surgeon: Irine Seal, MD;  Location: [facility];  Service: Urology;  Laterality: N/A;   HIP ARTHROPLASTY Left 2018   MOUTH SURGERY  1972   PROCTOSCOPY N/A 08/27/2014   Procedure: PROCTOSCOPY;  Surgeon: Leighton Ruff, MD;  Location: [facility];  Service: General;  Laterality: N/A;   TRANSANAL RECTAL RESECTION N/A 08/27/2014   Procedure: TRANSANAL RESECTION OF RECTAL TUMOR;  Surgeon: Leighton Ruff, MD;  Location: [facility];  Service: General;  Laterality: N/A;    Social History   Socioeconomic History   Marital status: Married    Spouse name: Not on file   Number of children: Not on file   Years of education: Not on file   Highest education level: Not on file  Occupational History   Not on file  Tobacco Use   Smoking status: Never   Smokeless tobacco: Never  Vaping Use   Vaping Use: Never used  Substance and Sexual Activity   Alcohol use: No   Drug use: No   Sexual activity: Not on file  Other Topics Concern   Not on file  Social History Narrative   Not on file   Social Determinants  of Health   Financial Resource Strain: Not on file  Food Insecurity: Not on file  Transportation Needs: Not on file  Physical Activity: Not on file  Stress: Not on file  Social Connections: Not on file  Intimate Partner Violence: Not on file     Allergies  Allergen Reactions   Nylon Rash   Lotrel [Amlodipine Besy-Benazepril Hcl] Hives and Swelling   Meat [Alpha-Gal]    Other Swelling    lycra    Chalk Other (See Comments)    sneezing     Outpatient Medications Prior to Visit  Medication Sig Dispense Refill   albuterol (VENTOLIN HFA) 108 (90 Base) MCG/ACT inhaler Inhale 2 puffs into the lungs every 6 (six) hours as needed for wheezing or shortness of breath. 8 g 0   amoxicillin-clavulanate  (AUGMENTIN) 875-125 MG tablet Take by mouth.     azithromycin (ZITHROMAX) 250 MG tablet Take 250 mg by mouth as directed.     EPINEPHrine 0.3 mg/0.3 mL IJ SOAJ injection Inject 0.3 mg into the muscle as needed for anaphylaxis. 2 each 1   fexofenadine (ALLEGRA) 180 MG tablet Take 180 mg by mouth daily.     fluticasone (FLONASE) 50 MCG/ACT nasal spray Use 2 sprays in each  nostril daily 48 g 3   Multiple Vitamin (MULTIVITAMIN) tablet Take 1 tablet by mouth daily.     rosuvastatin (CRESTOR) 10 MG tablet TAKE 1 TABLET BY MOUTH EVERY DAY 90 tablet 3   Tiotropium Bromide Monohydrate 1.25 MCG/ACT AERS Inhale 1.25 mcg into the lungs daily. 1 each 2   zinc gluconate 50 MG tablet Take 50 mg by mouth daily.     aspirin 81 MG tablet Take 81 mg by mouth daily. (Patient not taking: Reported on 04/22/2021)     predniSONE (DELTASONE) 20 MG tablet 3 tabs poqday 1-2, 2 tabs poqday 3-4, 1 tab poqday 5-6 (Patient not taking: Reported on 04/22/2021) 12 tablet 0   No facility-administered medications prior to visit.       Objective:   Physical Exam:  General appearance: 70 y.o., male, NAD, conversant  Eyes: anicteric sclerae; PERRL, tracking appropriately HENT: NCAT; MMM Neck: Trachea midline; no lymphadenopathy, no JVD Lungs: Wheeze bilaterally, bronchial breath sounds diffusely, with normal respiratory effort CV: RRR, no murmur  Abdomen: Soft, non-tender; non-distended, BS present  Extremities: No peripheral edema, warm Skin: Normal turgor and texture; no rash Psych: Appropriate affect Neuro: Alert and oriented to person and place, no focal deficit     Vitals:   04/22/21 0958  BP: 122/76  Pulse: 65  Temp: 98 F (36.7 C)  TempSrc: Oral  SpO2: 93%  Weight: 230 lb (104.3 kg)  Height: _0  (1.88 m)   93% on RA BMI Readings from Last 3 Encounters:  04/22/21 29.53 kg/m  03/19/21 29.20 kg/m  02/25/21 29.79 kg/m   Wt Readings from Last 3 Encounters:  04/22/21 230 lb (104.3 kg)   03/19/21 227 lb 6.4 oz (103.1 kg)  02/25/21 232 lb (105.2 kg)     CBC    Component Value Date/Time   WBC 7.4 07/31/2020 0857   RBC 4.62 07/31/2020 0857   HGB 14.4 07/31/2020 0857   HGB 15.5 09/01/2017 1048   HCT 43.1 07/31/2020 0857   HCT 45.6 09/01/2017 1048   PLT 258 07/31/2020 0857   PLT 252 09/01/2017 1048   MCV 93.3 07/31/2020 0857   MCV 91 09/01/2017 1048   MCH 31.2 07/31/2020 0857   MCHC 33.4 07/31/2020 0857  RDW 12.3 07/31/2020 0857   RDW 13.9 09/01/2017 1048   LYMPHSABS 977 07/31/2020 0857   LYMPHSABS 1.5 09/01/2017 1048   MONOABS 1,092 (H) 02/20/2016 1249   EOSABS 496 07/31/2020 0857   EOSABS 0.4 09/01/2017 1048   BASOSABS 59 07/31/2020 0857   BASOSABS 0.0 09/01/2017 1048    Chest Imaging: CT Chest 04/11/2021 reviewed by me remarkable for calcified mediastinal LAD, bronchovascular thickening and bronchial stenosis, pulmonary nodules. Airways disease has progressed significantly and nodes are now calcified to greater extent  Pulmonary Functions Testing Results: No flowsheet data found.      Assessment & Plan:   # Mediastinal LAD # Bronchial wall thickening and bronchial stenosis # Multiple pulmonary nodules Concerned that he has active sarcoidosis and/or fibrosing mediastinitis. He may have superimposed atypical infection but my suspicion is relatively low for this.  Plan: - EBUS, bronch and BAL under general - consideration of steroid course after reviewing results - tentatively plan on clinic visit in January with PFTs   I spent 62 minutes dedicated to the care of this patient on the date of this encounter to include pre-visit review of records, face-to-face time with the patient discussing conditions above, post visit ordering of testing, clinical documentation with the electronic health record, making appropriate referrals as documented, and communicating necessary findings to members of the patients care team.    Maryjane Hurter, MD [place]  [facility] 04/22/2021 12:49 PM

## 2021-04-22 ENCOUNTER — Encounter: Payer: Self-pay | Admitting: Student

## 2021-04-22 ENCOUNTER — Telehealth: Payer: Self-pay | Admitting: Student

## 2021-04-22 ENCOUNTER — Ambulatory Visit: Payer: Medicare Other | Admitting: Student

## 2021-04-22 ENCOUNTER — Other Ambulatory Visit: Payer: Self-pay

## 2021-04-22 VITALS — BP 122/76 | HR 65 | Temp 98.0°F | Ht 74.0 in | Wt 230.0 lb

## 2021-04-22 DIAGNOSIS — D869 Sarcoidosis, unspecified: Secondary | ICD-10-CM

## 2021-04-22 MED ORDER — ADVAIR HFA 115-21 MCG/ACT IN AERO: 2.0000 | INHALATION_SPRAY | Freq: Two times a day (BID) | RESPIRATORY_TRACT | 12 refills | Status: DC

## 2021-04-22 NOTE — Patient Instructions (Signed)
-   advair 2 puffs twice daily, rinse mouth after use - we will try to schedule your bronchoscopy in early January, clinic visit and PFTs (breathing tests) in mid January   - If advair too expensive then call your insurer and ask which LABA/ICS would have lowest copay  LABA/ICS Inhaler Options:  GoodRx: AirDuo Respiclick $98-42 [facility] Advair 100/250/500 HFA $61/74/133 (publix) at [facility] Symbicort 80/160 is about $90 at variety Dulera, breo are unaffordable  AZ&Me: Symbicort can be provided at no cost  [employer] patient assistance program: Advair and Breo. Must have spent $600 out-of-pocket on prescriptions this calendar year. If patient hasn't, they would not qualify for the program. Has to have local pharmacy print out how much is spent out of pocket to submit with application.

## 2021-04-22 NOTE — Telephone Encounter (Signed)
Dr Verlee Monte- pt calling stating that he can do bronch on the below dates:  1/2,1/3, or 1/4. Please advise. [phone]  Will any of these dates work for you?

## 2021-04-23 NOTE — Telephone Encounter (Signed)
Is [facility] okay for these dates? [facility] not available. Thanks!

## 2021-04-23 NOTE — Telephone Encounter (Signed)
I have sent a message to Dr Verlee Monte on secure chat they cant do [facility] they want me to schedule at [facility] waiting on his response

## 2021-04-23 NOTE — Telephone Encounter (Signed)
Forwarding [facility]'s response to PCC's   Maryjane Hurter, MD to [facility] Triage Pool     6:19 PM I'm fine with any of 1/3, 1/4, 1/5, preference for [place] and afternoon

## 2021-04-23 NOTE — Telephone Encounter (Signed)
Yep [place] fine with me if we can't do [facility]

## 2021-04-24 ENCOUNTER — Encounter: Payer: Self-pay | Admitting: Student

## 2021-04-29 ENCOUNTER — Encounter (HOSPITAL_COMMUNITY): Payer: Self-pay | Admitting: Student

## 2021-05-02 ENCOUNTER — Ambulatory Visit (INDEPENDENT_AMBULATORY_CARE_PROVIDER_SITE_OTHER): Payer: Medicare Other | Admitting: Family Medicine

## 2021-05-02 ENCOUNTER — Other Ambulatory Visit: Payer: Self-pay

## 2021-05-02 ENCOUNTER — Encounter: Payer: Self-pay | Admitting: Family Medicine

## 2021-05-02 VITALS — BP 122/78 | HR 63 | Temp 97.7°F | Resp 16 | Wt 229.0 lb

## 2021-05-02 DIAGNOSIS — D869 Sarcoidosis, unspecified: Secondary | ICD-10-CM

## 2021-05-02 NOTE — Progress Notes (Signed)
Subjective:    Patient ID: Thomas Livingston, male    DOB: 26-Jun-1950, 70 y.o.   MRN: [id number] Recent CT of Chest- Mediastinum/Nodes: There are multiple borderline enlarged and enlarged mediastinal and bilateral hilar lymph nodes, measuring up to 2.1 cm in short axis in the low right paratracheal nodal station, many of which are densely calcified. This appears relatively similar to the prior study from 2007. Esophagus is unremarkable in appearance. No axillary lymphadenopathy.   Lungs/Pleura: There is profound thickening of the peribronchovascular interstitium scattered throughout the lungs bilaterally, which has increased compared to the prior examination, and is associated with widespread nodularity in the lungs, most evident in both a peribronchovascular and subpleural distribution (i.e., the overall distribution is perilymphatic). This nodularity is widespread in the lungs,, with particular involvement of the perihilar aspects of the mid to upper lungs, and extensive involvement of the right middle lobe where there is substantial partial collapse. No pleural effusions.  Recently saw pulmonology whose recs are listed below for my reference:   # Mediastinal LAD # Bronchial wall thickening and bronchial stenosis # Multiple pulmonary nodules Concerned that he has active sarcoidosis and/or fibrosing mediastinitis. He may have superimposed atypical infection but my suspicion is relatively low for this.   Plan: - EBUS, bronch and BAL under general - consideration of steroid course after reviewing results - tentatively plan on clinic visit in January with PFTs   Patient is here today primarily for discussion.  He has not started the Advair was prescribed to him by pulmonology.  He is concerned about becoming "addicted" to the steroids.  He is also concerned about the medicine possibly raising his blood pressure.  He continues to have wheezing and a cough that is productive of  yellowish sputum.  He feels that he has allergies and asthma rather than sarcoidosis.  He believes that his allergies and dust exposure are why he has had so many frequent exacerbations recently. Past Medical History:  Diagnosis Date   Allergy    Arthritis    left hip   Asthma    History of kidney stones    Hyperlipidemia    Hypertension 2012   no bp meds since 2012   Multiple lung nodules on CT 2007   no current follow up done   Psoriasis    both legs   Urticaria    Urticaria 03/31/2018   Past Surgical History:  Procedure Laterality Date   CHOLECYSTECTOMY  2007   EUS N/A 06/28/2014   Procedure: LOWER ENDOSCOPIC ULTRASOUND (EUS);  Surgeon: Milus Banister, MD;  Location: [facility];  Service: Endoscopy;  Laterality: N/A;   EXTRACORPOREAL SHOCK WAVE LITHOTRIPSY N/A 03/21/2018   Procedure: EXTRACORPOREAL SHOCK WAVE LITHOTRIPSY (ESWL);  Surgeon: Irine Seal, MD;  Location: [facility];  Service: Urology;  Laterality: N/A;   HIP ARTHROPLASTY Left 2018   MOUTH SURGERY  1972   PROCTOSCOPY N/A 08/27/2014   Procedure: PROCTOSCOPY;  Surgeon: Leighton Ruff, MD;  Location: [facility];  Service: General;  Laterality: N/A;   TRANSANAL RECTAL RESECTION N/A 08/27/2014   Procedure: TRANSANAL RESECTION OF RECTAL TUMOR;  Surgeon: Leighton Ruff, MD;  Location: [facility];  Service: General;  Laterality: N/A;   Current Outpatient Medications on File Prior to Visit  Medication Sig Dispense Refill   albuterol (VENTOLIN HFA) 108 (90 Base) MCG/ACT inhaler Inhale 2 puffs into the lungs every 6 (six) hours as needed for wheezing or shortness of breath. 8 g 0   aspirin EC 81  MG tablet Take 81 mg by mouth in the morning. Swallow whole.     EPINEPHrine 0.3 mg/0.3 mL IJ SOAJ injection Inject 0.3 mg into the muscle as needed for anaphylaxis. 2 each 1   fexofenadine (ALLEGRA) 180 MG tablet Take 180 mg by mouth daily. [place] Signature Aller-Fex Antihistamine 180 mg     fluticasone (FLONASE) 50 MCG/ACT nasal spray Use 2  sprays in each  nostril daily (Patient taking differently: 2 sprays every evening. Use 2 sprays in each  nostril daily) 48 g 3   fluticasone-salmeterol (ADVAIR HFA) 115-21 MCG/ACT inhaler Inhale 2 puffs into the lungs 2 (two) times daily. 1 each 12   Homeopathic Products (COLD REMEDY PO) Take 2 tablets by mouth every 2 (two) hours as needed (cold symptoms).     Multiple Vitamin (MULTIVITAMIN) tablet Take 1 tablet by mouth daily.     predniSONE (DELTASONE) 20 MG tablet 3 tabs poqday 1-2, 2 tabs poqday 3-4, 1 tab poqday 5-6 (Patient not taking: Reported on 05/01/2021) 12 tablet 0   rosuvastatin (CRESTOR) 10 MG tablet TAKE 1 TABLET BY MOUTH EVERY DAY 90 tablet 3   Tiotropium Bromide Monohydrate 1.25 MCG/ACT AERS Inhale 1.25 mcg into the lungs daily. 1 each 2   No current facility-administered medications on file prior to visit.   Allergies  Allergen Reactions   Meat [Alpha-Gal] Anaphylaxis   Nylon Rash   Lotrel [Amlodipine Besy-Benazepril Hcl] Hives and Swelling   Other Swelling    lycra    Chalk Other (See Comments)    sneezing   Social History   Socioeconomic History   Marital status: Married    Spouse name: Not on file   Number of children: Not on file   Years of education: Not on file   Highest education level: Not on file  Occupational History   Not on file  Tobacco Use   Smoking status: Never   Smokeless tobacco: Never  Vaping Use   Vaping Use: Never used  Substance and Sexual Activity   Alcohol use: No   Drug use: No   Sexual activity: Not on file  Other Topics Concern   Not on file  Social History Narrative   Not on file   Social Determinants of Health   Financial Resource Strain: Not on file  Food Insecurity: Not on file  Transportation Needs: Not on file  Physical Activity: Not on file  Stress: Not on file  Social Connections: Not on file  Intimate Partner Violence: Not on file   Family History  Problem Relation Age of Onset   Thyroid cancer Mother     Lung cancer Father    Colon cancer Neg Hx       Review of Systems  All other systems reviewed and are negative.     Objective:   Physical Exam Vitals reviewed.  Constitutional:      General: He is not in acute distress.    Appearance: He is well-developed. He is not diaphoretic.  HENT:     Head: Normocephalic and atraumatic.     Right Ear: External ear normal.     Left Ear: External ear normal.     Nose: Rhinorrhea present.     Mouth/Throat:     Pharynx: No oropharyngeal exudate.  Eyes:     General: No scleral icterus.       Right eye: No discharge.        Left eye: No discharge.     Conjunctiva/sclera: Conjunctivae normal.  Pupils: Pupils are equal, round, and reactive to light.  Neck:     Thyroid: No thyromegaly.     Vascular: No JVD.     Trachea: No tracheal deviation.  Cardiovascular:     Rate and Rhythm: Normal rate and regular rhythm.     Heart sounds: Normal heart sounds. No murmur heard.   No friction rub. No gallop.  Pulmonary:     Effort: Pulmonary effort is normal. No respiratory distress.     Breath sounds: No stridor. Wheezing present. No rales.  Chest:     Chest wall: No tenderness.  Abdominal:     General: Bowel sounds are normal. There is no distension.     Palpations: Abdomen is soft. There is no mass.     Tenderness: There is no abdominal tenderness. There is no guarding or rebound.  Musculoskeletal:        General: No tenderness or deformity. Normal range of motion.     Cervical back: Normal range of motion and neck supple.  Lymphadenopathy:     Cervical: No cervical adenopathy.  Skin:    General: Skin is warm.     Coloration: Skin is not pale.     Findings: No erythema or rash.  Neurological:     Mental Status: He is alert and oriented to person, place, and time.     Cranial Nerves: No cranial nerve deficit.     Motor: No abnormal muscle tone.     Coordination: Coordination normal.     Deep Tendon Reflexes: Reflexes are normal and  symmetric.  Psychiatric:        Behavior: Behavior normal.        Thought Content: Thought content normal.        Judgment: Judgment normal.          Assessment & Plan:  Sarcoidosis I spent approximately 30 minutes today with the patient primarily just talking.  I explained about sarcoidosis and whether it is active or in remission.  I am concerned that his sarcoidosis is now active and that this is why he had frequent "attacks" recently.  I recommended that he start the Advair immediately.  I explained the principal of inhaled corticosteroids.  I do not feel that he will become "addicted" to the steroids especially given the remote dose.  We did discuss side effects such as thrush and potential increased risk of pneumonia.  We also discussed the calcification seen in his coronary arteries.  He does have shortness of breath but I attribute this to the sarcoidosis.  He denies any chest pain or angina.  We discussed whether he would benefit from a stress test however we both decided to put this off until after the work-up has been completed regarding the biopsy of the lymph nodes in January.

## 2021-05-07 ENCOUNTER — Encounter: Payer: Self-pay | Admitting: Student

## 2021-05-09 ENCOUNTER — Other Ambulatory Visit: Payer: Self-pay | Admitting: Student

## 2021-05-09 LAB — SARS CORONAVIRUS 2 (TAT 6-24 HRS): SARS Coronavirus 2: NEGATIVE

## 2021-05-13 ENCOUNTER — Encounter (HOSPITAL_COMMUNITY)
Admission: RE | Disposition: A | Discharge status: Home / Self Care | Payer: Self-pay | Source: Ambulatory Visit | Attending: Student

## 2021-05-13 ENCOUNTER — Encounter (HOSPITAL_COMMUNITY): Payer: Self-pay | Admitting: Student

## 2021-05-13 ENCOUNTER — Other Ambulatory Visit: Payer: Self-pay

## 2021-05-13 ENCOUNTER — Ambulatory Visit (HOSPITAL_COMMUNITY)
Admission: RE | Admit: 2021-05-13 | Discharge: 2021-05-13 | Disposition: A | Discharge status: Home / Self Care | Payer: Medicare Other | Source: Ambulatory Visit | Attending: Student | Admitting: Student

## 2021-05-13 ENCOUNTER — Ambulatory Visit (HOSPITAL_COMMUNITY): Payer: Medicare Other | Admitting: Certified Registered"

## 2021-05-13 DIAGNOSIS — D869 Sarcoidosis, unspecified: Secondary | ICD-10-CM | POA: Diagnosis not present

## 2021-05-13 DIAGNOSIS — R590 Localized enlarged lymph nodes: Secondary | ICD-10-CM | POA: Diagnosis not present

## 2021-05-13 DIAGNOSIS — J45909 Unspecified asthma, uncomplicated: Secondary | ICD-10-CM | POA: Diagnosis not present

## 2021-05-13 DIAGNOSIS — I10 Essential (primary) hypertension: Secondary | ICD-10-CM | POA: Insufficient documentation

## 2021-05-13 DIAGNOSIS — R918 Other nonspecific abnormal finding of lung field: Secondary | ICD-10-CM | POA: Insufficient documentation

## 2021-05-13 HISTORY — PX: BRONCHIAL WASHINGS: SHX5105

## 2021-05-13 HISTORY — PX: FINE NEEDLE ASPIRATION: SHX5430

## 2021-05-13 HISTORY — PX: ENDOBRONCHIAL ULTRASOUND: SHX5096

## 2021-05-13 HISTORY — PX: VIDEO BRONCHOSCOPY: SHX5072

## 2021-05-13 LAB — BODY FLUID CELL COUNT WITH DIFFERENTIAL
Eos, Fluid: 1 %
Lymphs, Fluid: 65 %
Monocyte-Macrophage-Serous Fluid: 28 % — ABNORMAL LOW (ref 50–90)
Neutrophil Count, Fluid: 6 % (ref 0–25)
Total Nucleated Cell Count, Fluid: 94 cu mm (ref 0–1000)

## 2021-05-13 SURGERY — ENDOBRONCHIAL ULTRASOUND (EBUS)
Anesthesia: General

## 2021-05-13 MED ORDER — SUGAMMADEX SODIUM 200 MG/2ML IV SOLN
INTRAVENOUS | Status: DC | PRN
Start: 2021-05-13 — End: 2021-05-13
  Administered 2021-05-13: 220 mg via INTRAVENOUS

## 2021-05-13 MED ORDER — FENTANYL CITRATE (PF) 100 MCG/2ML IJ SOLN
INTRAMUSCULAR | Status: AC
  Filled 2021-05-13: qty 2

## 2021-05-13 MED ORDER — PROPOFOL 10 MG/ML IV BOLUS
INTRAVENOUS | Status: AC
  Filled 2021-05-13: qty 20

## 2021-05-13 MED ORDER — PROPOFOL 10 MG/ML IV BOLUS
INTRAVENOUS | Status: DC | PRN
  Administered 2021-05-13: 150 mg via INTRAVENOUS

## 2021-05-13 MED ORDER — ROCURONIUM BROMIDE 10 MG/ML (PF) SYRINGE
PREFILLED_SYRINGE | INTRAVENOUS | Status: DC | PRN
Start: 2021-05-13 — End: 2021-05-13
  Administered 2021-05-13: 70 mg via INTRAVENOUS

## 2021-05-13 MED ORDER — LACTATED RINGERS IV SOLN
INTRAVENOUS | Status: AC | PRN
  Administered 2021-05-13: 1000 mL via INTRAVENOUS

## 2021-05-13 MED ORDER — ONDANSETRON HCL 4 MG/2ML IJ SOLN
INTRAMUSCULAR | Status: DC | PRN
  Administered 2021-05-13: 4 mg via INTRAVENOUS

## 2021-05-13 MED ORDER — DEXAMETHASONE SODIUM PHOSPHATE 10 MG/ML IJ SOLN
INTRAMUSCULAR | Status: DC | PRN
  Administered 2021-05-13: 4 mg via INTRAVENOUS

## 2021-05-13 MED ORDER — LIDOCAINE 2% (20 MG/ML) 5 ML SYRINGE
INTRAMUSCULAR | Status: DC | PRN
  Administered 2021-05-13: 100 mg via INTRAVENOUS

## 2021-05-13 MED ORDER — FENTANYL CITRATE (PF) 100 MCG/2ML IJ SOLN
INTRAMUSCULAR | Status: DC | PRN
  Administered 2021-05-13: 50 ug via INTRAVENOUS

## 2021-05-13 NOTE — Anesthesia Preprocedure Evaluation (Addendum)
Anesthesia Evaluation  Patient identified by MRN, date of birth, ID band Patient awake    Reviewed: Allergy & Precautions, NPO status , Patient's Chart, lab work & pertinent test results  History of Anesthesia Complications Negative for: history of anesthetic complications  Airway Mallampati: II  TM Distance: >3 FB Neck ROM: Full    Dental  (+) Teeth Intact, Dental Advisory Given   Pulmonary asthma ,  Pulmonary sarcoidosis   Pulmonary exam normal        Cardiovascular hypertension, Normal cardiovascular exam     Neuro/Psych negative neurological ROS     GI/Hepatic negative GI ROS, Neg liver ROS,   Endo/Other  negative endocrine ROS  Renal/GU negative Renal ROS  negative genitourinary   Musculoskeletal  (+) Arthritis ,   Abdominal   Peds  Hematology negative hematology ROS (+)   Anesthesia Other Findings   Reproductive/Obstetrics                            Anesthesia Physical Anesthesia Plan  ASA: 2  Anesthesia Plan: General   Post-op Pain Management: Minimal or no pain anticipated   Induction: Intravenous  PONV Risk Score and Plan: 2 and Ondansetron, Dexamethasone, Treatment may vary due to age or medical condition and Midazolam  Airway Management Planned: Oral ETT  Additional Equipment: None  Intra-op Plan:   Post-operative Plan: Extubation in OR  Informed Consent: I have reviewed the patients History and Physical, chart, labs and discussed the procedure including the risks, benefits and alternatives for the proposed anesthesia with the patient or authorized representative who has indicated his/her understanding and acceptance.     Dental advisory given  Plan Discussed with:   Anesthesia Plan Comments:        Anesthesia Quick Evaluation

## 2021-05-13 NOTE — Transfer of Care (Signed)
Immediate Anesthesia Transfer of Care Note  Patient: Thomas Livingston  Procedure(s) Performed: ENDOBRONCHIAL ULTRASOUND VIDEO BRONCHOSCOPY WITHOUT FLUORO BRONCHIAL WASHINGS FINE NEEDLE ASPIRATION (FNA) LINEAR  Patient Location: PACU  Anesthesia Type:General  Level of Consciousness: awake, alert  and oriented  Airway & Oxygen Therapy: Patient Spontanous Breathing and Patient connected to face mask oxygen  Post-op Assessment: Report given to RN, Post -op Vital signs reviewed and stable and Patient moving all extremities X 4  Post vital signs: Reviewed and stable  Last Vitals:  Vitals Value Taken Time  BP 133/68   Temp    Pulse 51 05/13/21 1310  Resp 15 05/13/21 1310  SpO2 99 % 05/13/21 1310  Vitals shown include unvalidated device data.  Last Pain:  Vitals:   05/13/21 1104  TempSrc: Oral  PainSc: 0-No pain         Complications: No notable events documented.

## 2021-05-13 NOTE — Discharge Instructions (Addendum)
Bloody streaks and coughing up small clots of blood is normal. If you're filling up the bottom of a cup with frank blood then send me a message or call or clinic and if persistent or having trouble breathing along with it then head to the ED. I'll see you 06/12/21 in clinic to review results in detail. Keep using advair in the meantime, rinsing mouth after use.

## 2021-05-13 NOTE — Op Note (Signed)
Flexible and EBUS Bronchoscopy Procedure Note  Thomas Livingston  [id number]  Sep 15, 1950  Date:05/13/21  Time:2:37 PM   Provider Performing:Happy Begeman Jerilynn Mages Verlee Monte   Procedure: Flexible bronchoscopy and EBUS Bronchoscopy  Indication(s) Mediastinal lymphadenopathy Possible fibrosing mediastinitis Possible sarcoidosis  Consent Risks of the procedure as well as the alternatives and risks of each were explained to the patient and/or caregiver.  Consent for the procedure was obtained.  Anesthesia General Anesthesia   Time Out Verified patient identification, verified procedure, site/side was marked, verified correct patient position, special equipment/implants available, medications/allergies/relevant history reviewed, required imaging and test results available.   Sterile Technique Usual hand hygiene, masks, gowns, and gloves were used   Procedure Description Diagnostic bronchoscope advanced through endotracheal tube and into airway.  Airways were examined down to subsegmental level with findings noted below.  Following diagnostic evaluation, BAL performed in RUL anterior segment with 150 cc saline instilled, 35cc return.  The diagnostic bronchoscope was then removed and the EBUS bronchoscope was advanced into airway with stations 4R, 7S biopsied and sent for slide, cell block, and/or culture.  The EBUS bronchoscope was removed after assuring no active bleeding from biopsy site.  Findings:  - mild extrinsic compression of left upper lobe takeoff - scattered pitting - firm mediastinal lymph nodes at 7S, 4R with calcification which were challenging to sample with 85I needle   Complications/Tolerance None; patient tolerated the procedure well. Chest X-ray is not needed post procedure.   EBL Minimal   Specimen(s) EBNA and BAL sent for micro, cyto

## 2021-05-13 NOTE — Anesthesia Procedure Notes (Signed)
Procedure Name: Intubation Date/Time: 05/13/2021 12:15 PM Performed by: Niel Hummer, CRNA Pre-anesthesia Checklist: Patient identified, Emergency Drugs available, Suction available and Patient being monitored Patient Re-evaluated:Patient Re-evaluated prior to induction Oxygen Delivery Method: Circle system utilized Preoxygenation: Pre-oxygenation with 100% oxygen Induction Type: IV induction Ventilation: Mask ventilation without difficulty Laryngoscope Size: Glidescope and 3 Grade View: Grade I Tube type: Oral Tube size: 8.0 mm Number of attempts: 1 Airway Equipment and Method: Video-laryngoscopy and Stylet Placement Confirmation: ETT inserted through vocal cords under direct vision, positive ETCO2 and breath sounds checked- equal and bilateral Secured at: 24 cm Tube secured with: Tape Dental Injury: Teeth and Oropharynx as per pre-operative assessment  Comments: Elective glidescope given history of glide use.

## 2021-05-13 NOTE — Interval H&P Note (Signed)
History and Physical Interval Note:  05/13/2021 12:04 PM  Thomas Livingston  has presented today for surgery, with the diagnosis of Sarcoid.  The various methods of treatment have been discussed with the patient and family. After consideration of risks, benefits and other options for treatment, the patient has consented to  Procedure(s): ENDOBRONCHIAL ULTRASOUND (N/A) VIDEO BRONCHOSCOPY WITHOUT FLUORO (N/A) as a surgical intervention.  The patient's history has been reviewed, patient examined, no change in status, stable for surgery.  I have reviewed the patient's chart and labs.  Questions were answered to the patient's satisfaction.     Maryjane Hurter

## 2021-05-14 LAB — PNEUMOCYSTIS JIROVECI SMEAR BY DFA: Pneumocystis jiroveci Ag: NEGATIVE

## 2021-05-14 LAB — PATHOLOGIST SMEAR REVIEW: Path Review: INCREASED

## 2021-05-14 NOTE — Anesthesia Postprocedure Evaluation (Signed)
Anesthesia Post Note  Patient: [name]  Procedure(s) Performed: ENDOBRONCHIAL ULTRASOUND VIDEO BRONCHOSCOPY WITHOUT FLUORO BRONCHIAL WASHINGS FINE NEEDLE ASPIRATION (FNA) LINEAR     Patient location during evaluation: PACU Anesthesia Type: General Level of consciousness: awake and alert Pain management: pain level controlled Vital Signs Assessment: post-procedure vital signs reviewed and stable Respiratory status: spontaneous breathing, nonlabored ventilation, respiratory function stable and patient connected to nasal cannula oxygen Cardiovascular status: blood pressure returned to baseline and stable Postop Assessment: no apparent nausea or vomiting Anesthetic complications: no   No notable events documented.  Last Vitals:  Vitals:   05/13/21 1330 05/13/21 1340  BP: (!) 119/53 122/81  Pulse: (!) 54 (!) 53  Resp: 14 18  Temp:    SpO2: 100% 100%    Last Pain:  Vitals:   05/13/21 1340  TempSrc:   PainSc: 0-No pain                 Naara Kelty L Caydon Feasel

## 2021-05-15 LAB — CYTOLOGY - NON PAP

## 2021-05-16 LAB — ACID FAST SMEAR (AFB, MYCOBACTERIA)
Acid Fast Smear: NEGATIVE
Acid Fast Smear: NEGATIVE

## 2021-05-16 LAB — CULTURE, RESPIRATORY W GRAM STAIN
Culture: NORMAL
Gram Stain: NONE SEEN

## 2021-05-17 ENCOUNTER — Encounter: Payer: Self-pay | Admitting: Student

## 2021-05-19 DIAGNOSIS — C3490 Malignant neoplasm of unspecified part of unspecified bronchus or lung: Secondary | ICD-10-CM | POA: Diagnosis not present

## 2021-05-20 ENCOUNTER — Other Ambulatory Visit: Payer: Self-pay

## 2021-05-20 MED ORDER — ROSUVASTATIN CALCIUM 10 MG PO TABS: 10.0000 mg | ORAL_TABLET | Freq: Every day | ORAL | 0 refills | Status: DC

## 2021-06-03 DIAGNOSIS — N401 Enlarged prostate with lower urinary tract symptoms: Secondary | ICD-10-CM | POA: Diagnosis not present

## 2021-06-03 DIAGNOSIS — Z87442 Personal history of urinary calculi: Secondary | ICD-10-CM | POA: Diagnosis not present

## 2021-06-03 DIAGNOSIS — R3912 Poor urinary stream: Secondary | ICD-10-CM | POA: Diagnosis not present

## 2021-06-03 DIAGNOSIS — N5311 Retarded ejaculation: Secondary | ICD-10-CM | POA: Diagnosis not present

## 2021-06-04 ENCOUNTER — Ambulatory Visit (INDEPENDENT_AMBULATORY_CARE_PROVIDER_SITE_OTHER): Payer: Medicare Other

## 2021-06-04 ENCOUNTER — Other Ambulatory Visit: Payer: Self-pay

## 2021-06-04 DIAGNOSIS — Z23 Encounter for immunization: Secondary | ICD-10-CM | POA: Diagnosis not present

## 2021-06-12 ENCOUNTER — Ambulatory Visit: Payer: Medicare Other | Admitting: Student

## 2021-06-17 LAB — FUNGUS CULTURE WITH STAIN

## 2021-06-17 LAB — FUNGAL ORGANISM REFLEX

## 2021-06-17 LAB — FUNGUS CULTURE RESULT

## 2021-06-25 DIAGNOSIS — R040 Epistaxis: Secondary | ICD-10-CM | POA: Diagnosis not present

## 2021-06-28 LAB — ACID FAST CULTURE WITH REFLEXED SENSITIVITIES (MYCOBACTERIA)
Acid Fast Culture: NEGATIVE
Acid Fast Culture: NEGATIVE

## 2021-06-30 DIAGNOSIS — N401 Enlarged prostate with lower urinary tract symptoms: Secondary | ICD-10-CM | POA: Diagnosis not present

## 2021-06-30 DIAGNOSIS — N5311 Retarded ejaculation: Secondary | ICD-10-CM | POA: Diagnosis not present

## 2021-06-30 DIAGNOSIS — N5201 Erectile dysfunction due to arterial insufficiency: Secondary | ICD-10-CM | POA: Diagnosis not present

## 2021-06-30 DIAGNOSIS — R3912 Poor urinary stream: Secondary | ICD-10-CM | POA: Diagnosis not present

## 2021-06-30 NOTE — Progress Notes (Signed)
Synopsis: Referred for sarcoidosis by Susy Frizzle, MD  Subjective:   PATIENT ID: Thomas Livingston GENDER: male DOB: Aug 28, 1950, MRN: [id number]  Chief Complaint  Patient presents with   Follow-up    PFT's done today. His breathing is overall doing well. He has a rash on his right arm.    71yM with history of sarcoidosis, asthma, alpha gal allergy followed by Dr. Neldon Mc, allergy, pulmonary nodules, HTN who is referred for recurring lung infection wondering if he has pneumonia, frequently coughing up phlegm, plugs.   He thinks he picked up a cold again from a sick contact. Has had 2 courses of steroids/ABX with some improvement after each course but not back to baseline. On third round of ABX currently taking augmentin and azithromycin. Hasn't yet tried spiriva inhaler.   Was diagnosed with sarcoid in 2007 based on hilar adenopathy but never had biopsy. 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 He never was treated for sarcoid, never was symptomatic.  Has a brother that died of pulmonary fibrosis that he attributes to agent orange exposure. Wife has sarcoid.  Did work for [employer] and didn't wear mask when working. Never smoked. Has never lived outside of [place]. No pets at home. He hunts and dresses deer.  Interval HPI:  Underwent EBUS, BAL 05/13/21.   BAL smear with increased macrophages/histiocytes. But no clear evidence of sarcoid or meaningful infectious process.   He really doesn't have cough. He has no significant DOE. No other bouts of 'bronchitis/pneumonia' since last visit. No courses of steroids/ABX since last visit. No CP, fever, unintentional weight loss.   He thinks he took his advair yesterday morning.   Otherwise pertinent review of systems is negative.   Past Medical History:  Diagnosis Date   Allergy    Arthritis    left hip   Asthma    History of kidney stones    Hyperlipidemia     Hypertension 2012   no bp meds since 2012   Multiple lung nodules on CT 2007   no current follow up done   Psoriasis    both legs   Urticaria    Urticaria 03/31/2018     Family History  Problem Relation Age of Onset   Thyroid cancer Mother    Lung cancer Father    Colon cancer Neg Hx      Past Surgical History:  Procedure Laterality Date   BRONCHIAL WASHINGS  05/13/2021   Procedure: BRONCHIAL WASHINGS;  Surgeon: Maryjane Hurter, MD;  Location: [facility];  Service: Pulmonary;;   CHOLECYSTECTOMY  2007   ENDOBRONCHIAL ULTRASOUND N/A 05/13/2021   Procedure: ENDOBRONCHIAL ULTRASOUND;  Surgeon: Maryjane Hurter, MD;  Location: [facility];  Service: Pulmonary;  Laterality: N/A;   EUS N/A 06/28/2014   Procedure: LOWER ENDOSCOPIC ULTRASOUND (EUS);  Surgeon: Milus Banister, MD;  Location: [facility];  Service: Endoscopy;  Laterality: N/A;   EXTRACORPOREAL SHOCK WAVE LITHOTRIPSY N/A 03/21/2018   Procedure: EXTRACORPOREAL SHOCK WAVE LITHOTRIPSY (ESWL);  Surgeon: Irine Seal, MD;  Location: [facility];  Service: Urology;  Laterality: N/A;   FINE NEEDLE ASPIRATION  05/13/2021   Procedure: FINE NEEDLE ASPIRATION (FNA) LINEAR;  Surgeon: Maryjane Hurter, MD;  Location: [facility];  Service: Pulmonary;;   HIP ARTHROPLASTY Left 2018   MOUTH SURGERY  1972   PROCTOSCOPY N/A 08/27/2014   Procedure: PROCTOSCOPY;  Surgeon: Leighton Ruff, MD;  Location: [facility];  Service: General;  Laterality: N/A;   TRANSANAL RECTAL RESECTION N/A 08/27/2014   Procedure: TRANSANAL RESECTION OF RECTAL TUMOR;  Surgeon: Leighton Ruff, MD;  Location: [facility];  Service: General;  Laterality: N/A;   VIDEO BRONCHOSCOPY N/A 05/13/2021   Procedure: VIDEO BRONCHOSCOPY WITHOUT FLUORO;  Surgeon: Maryjane Hurter, MD;  Location: [facility];  Service: Pulmonary;  Laterality: N/A;    Social History   Socioeconomic History   Marital status: Married    Spouse name: Not on file   Number of children: Not on file   Years of  education: Not on file   Highest education level: Not on file  Occupational History   Not on file  Tobacco Use   Smoking status: Never   Smokeless tobacco: Never  Vaping Use   Vaping Use: Never used  Substance and Sexual Activity   Alcohol use: No   Drug use: No   Sexual activity: Not on file  Other Topics Concern   Not on file  Social History Narrative   Not on file   Social Determinants of Health   Financial Resource Strain: Not on file  Food Insecurity: Not on file  Transportation Needs: Not on file  Physical Activity: Not on file  Stress: Not on file  Social Connections: Not on file  Intimate Partner Violence: Not on file     Allergies  Allergen Reactions   Meat [Alpha-Gal] Anaphylaxis   Nylon Rash   Lotrel [Amlodipine Besy-Benazepril Hcl] Hives and Swelling   Other Swelling    lycra    Chalk Other (See Comments)    sneezing     Outpatient Medications Prior to Visit  Medication Sig Dispense Refill   albuterol (VENTOLIN HFA) 108 (90 Base) MCG/ACT inhaler Inhale 2 puffs into the lungs every 6 (six) hours as needed for wheezing or shortness of breath. 8 g 0   aspirin EC 81 MG tablet Take 81 mg by mouth in the morning. Swallow whole.     EPINEPHrine 0.3 mg/0.3 mL IJ SOAJ injection Inject 0.3 mg into the muscle as needed for anaphylaxis. 2 each 1   fexofenadine (ALLEGRA) 180 MG tablet Take 180 mg by mouth daily. [place] Signature Aller-Fex Antihistamine 180 mg     fluticasone (FLONASE) 50 MCG/ACT nasal spray Use 2 sprays in each  nostril daily (Patient taking differently: 2 sprays every evening. Use 2 sprays in each  nostril daily) 48 g 3   fluticasone-salmeterol (ADVAIR HFA) 115-21 MCG/ACT inhaler Inhale 2 puffs into the lungs 2 (two) times daily. 1 each 12   Homeopathic Products (COLD REMEDY PO) Take 2 tablets by mouth every 2 (two) hours as needed (cold symptoms).     Multiple Vitamin (MULTIVITAMIN) tablet Take 1 tablet by mouth daily.     rosuvastatin (CRESTOR)  10 MG tablet Take 1 tablet (10 mg total) by mouth daily. 90 tablet 0   Tiotropium Bromide Monohydrate 1.25 MCG/ACT AERS Inhale 1.25 mcg into the lungs daily. 1 each 2   No facility-administered medications prior to visit.       Objective:   Physical Exam:  General appearance: 71 y.o., male, NAD, conversant  Eyes: anicteric sclerae; PERRL, tracking appropriately HENT: NCAT; MMM Neck: Trachea midline; no lymphadenopathy, no JVD Lungs: Good air movement, with normal respiratory effort CV: RRR, no murmur  Abdomen: Soft, non-tender; non-distended, BS present  Extremities: No peripheral edema, warm Skin: Normal turgor and texture; no rash Psych: Appropriate affect Neuro: Alert and oriented to person and  place, no focal deficit     Vitals:   07/02/21 1018  BP: 122/74  Pulse: 65  Temp: 98.1 F (36.7 C)  TempSrc: Oral  SpO2: 97%  Weight: 231 lb 3.2 oz (104.9 kg)  Height: _0  (1.88 m)    97% on RA BMI Readings from Last 3 Encounters:  07/02/21 29.68 kg/m  05/13/21 29.40 kg/m  05/02/21 29.40 kg/m   Wt Readings from Last 3 Encounters:  07/02/21 231 lb 3.2 oz (104.9 kg)  05/13/21 229 lb (103.9 kg)  05/02/21 229 lb (103.9 kg)     CBC    Component Value Date/Time   WBC 7.4 07/31/2020 0857   RBC 4.62 07/31/2020 0857   HGB 14.4 07/31/2020 0857   HGB 15.5 09/01/2017 1048   HCT 43.1 07/31/2020 0857   HCT 45.6 09/01/2017 1048   PLT 258 07/31/2020 0857   PLT 252 09/01/2017 1048   MCV 93.3 07/31/2020 0857   MCV 91 09/01/2017 1048   MCH 31.2 07/31/2020 0857   MCHC 33.4 07/31/2020 0857   RDW 12.3 07/31/2020 0857   RDW 13.9 09/01/2017 1048   LYMPHSABS 977 07/31/2020 0857   LYMPHSABS 1.5 09/01/2017 1048   MONOABS 1,092 (H) 02/20/2016 1249   EOSABS 496 07/31/2020 0857   EOSABS 0.4 09/01/2017 1048   BASOSABS 59 07/31/2020 0857   BASOSABS 0.0 09/01/2017 1048    Chest Imaging: CT Chest 04/11/2021 reviewed by me remarkable for calcified mediastinal LAD,  bronchovascular thickening and bronchial stenosis, pulmonary nodules. Airways disease has progressed significantly and nodes are now calcified to greater extent  Pulmonary Functions Testing Results: PFT Results Latest Ref Rng & Units 07/02/2021  FVC-Pre L 4.35  FVC-Predicted Pre % 86  FVC-Post L 4.52  FVC-Predicted Post % 90  Pre FEV1/FVC % % 64  Post FEV1/FCV % % 68  FEV1-Pre L 2.78  FEV1-Predicted Pre % 74  FEV1-Post L 3.09  DLCO uncorrected ml/min/mmHg 22.61  DLCO UNC% % 78  DLCO corrected ml/min/mmHg 22.61  DLCO COR %Predicted % 78  DLVA Predicted % 91  TLC L 6.59  TLC % Predicted % 86  RV % Predicted % 82        Assessment & Plan:   # Mild persistent asthma # Mediastinal LAD # Bronchial wall thickening and bronchial stenosis # Multiple pulmonary nodules Concerned that he may have sarcoidosis and/or fibrosing mediastinitis. EBUS, BAL unrevealing for any particular etiology. He may have superimposed atypical infection but my suspicion is relatively low for this. PFTs today with reversible obstruction, essentially positive BD response.   Plan: - continue advair 2 puffs twice daily rinsing mouth after use - consideration of steroid course if progressive decline in lung function, radiographic, and/or symptomatic progression - HRCT in 3 months - spirometry next visit - RTC 4 months    Maryjane Hurter, MD [facility] 07/02/2021 10:26 AM

## 2021-07-02 ENCOUNTER — Ambulatory Visit (INDEPENDENT_AMBULATORY_CARE_PROVIDER_SITE_OTHER): Payer: Medicare Other | Admitting: Student

## 2021-07-02 ENCOUNTER — Ambulatory Visit: Payer: Medicare Other | Admitting: Student

## 2021-07-02 ENCOUNTER — Encounter: Payer: Self-pay | Admitting: Student

## 2021-07-02 ENCOUNTER — Other Ambulatory Visit: Payer: Self-pay

## 2021-07-02 VITALS — BP 122/74 | HR 65 | Temp 98.1°F | Ht 74.0 in | Wt 231.2 lb

## 2021-07-02 DIAGNOSIS — D869 Sarcoidosis, unspecified: Secondary | ICD-10-CM

## 2021-07-02 DIAGNOSIS — J4530 Mild persistent asthma, uncomplicated: Secondary | ICD-10-CM | POA: Diagnosis not present

## 2021-07-02 DIAGNOSIS — J849 Interstitial pulmonary disease, unspecified: Secondary | ICD-10-CM | POA: Diagnosis not present

## 2021-07-02 LAB — PULMONARY FUNCTION TEST
DL/VA % pred: 91 %
DL/VA: 3.68 ml/min/mmHg/L
DLCO cor % pred: 78 %
DLCO cor: 22.61 ml/min/mmHg
DLCO unc % pred: 78 %
DLCO unc: 22.61 ml/min/mmHg
FEF 25-75 Post: 2.29 L/sec
FEF 25-75 Pre: 1.73 L/sec
FEF2575-%Change-Post: 32 %
FEF2575-%Pred-Post: 79 %
FEF2575-%Pred-Pre: 60 %
FEV1-%Change-Post: 11 %
FEV1-%Pred-Post: 83 %
FEV1-%Pred-Pre: 74 %
FEV1-Post: 3.09 L
FEV1-Pre: 2.78 L
FEV1FVC-%Change-Post: 6 %
FEV1FVC-%Pred-Pre: 86 %
FEV6-%Change-Post: 4 %
FEV6-%Pred-Post: 93 %
FEV6-%Pred-Pre: 90 %
FEV6-Post: 4.46 L
FEV6-Pre: 4.29 L
FEV6FVC-%Change-Post: 0 %
FEV6FVC-%Pred-Post: 104 %
FEV6FVC-%Pred-Pre: 103 %
FVC-%Change-Post: 4 %
FVC-%Pred-Post: 90 %
FVC-%Pred-Pre: 86 %
FVC-Post: 4.52 L
FVC-Pre: 4.35 L
Post FEV1/FVC ratio: 68 %
Post FEV6/FVC ratio: 99 %
Pre FEV1/FVC ratio: 64 %
Pre FEV6/FVC Ratio: 99 %
RV % pred: 82 %
RV: 2.11 L
TLC % pred: 86 %
TLC: 6.59 L

## 2021-07-02 NOTE — Progress Notes (Signed)
PFT done today. 

## 2021-07-02 NOTE — Patient Instructions (Addendum)
-   Keep using advair 2 puffs twice daily, rinse mouth after use - CT Chest you will be called to schedule in a few months - breathing tests next visit - see you in 4 months!

## 2021-07-14 ENCOUNTER — Telehealth: Payer: Self-pay | Admitting: Family Medicine

## 2021-07-14 NOTE — Telephone Encounter (Signed)
Patient left voicemail message to report flareup of epididymitis. Requesting for provider to send refill to pharmacy; stated in voicemail appointment not usually needed. ? ?Please advise at [phone].  ?

## 2021-07-15 NOTE — Telephone Encounter (Signed)
Please advise, thanks.

## 2021-07-16 ENCOUNTER — Other Ambulatory Visit: Payer: Self-pay | Admitting: Family Medicine

## 2021-07-16 MED ORDER — DOXYCYCLINE HYCLATE 100 MG PO TABS: 100.0000 mg | ORAL_TABLET | Freq: Two times a day (BID) | ORAL | 0 refills | Status: DC

## 2021-07-16 NOTE — Telephone Encounter (Signed)
Spoke with patient and advised of rx. Nothing further needed at this time.  ? ?

## 2021-07-23 DIAGNOSIS — R040 Epistaxis: Secondary | ICD-10-CM | POA: Diagnosis not present

## 2021-08-07 ENCOUNTER — Ambulatory Visit (INDEPENDENT_AMBULATORY_CARE_PROVIDER_SITE_OTHER): Payer: Medicare Other

## 2021-08-07 VITALS — Ht 74.0 in | Wt 227.0 lb

## 2021-08-07 DIAGNOSIS — Z0000 Encounter for general adult medical examination without abnormal findings: Secondary | ICD-10-CM

## 2021-08-07 NOTE — Patient Instructions (Signed)
[name] , ?Thank you for taking time to come for your Medicare Wellness Visit. I appreciate your ongoing commitment to your health goals. Please review the following plan we discussed and let me know if I can assist you in the future.  ? ?Screening recommendations/referrals: ?Colonoscopy: Done 06/11/2014 Repeat in 10 years ? ?Recommended yearly ophthalmology/optometry visit for glaucoma screening and checkup ?Recommended yearly dental visit for hygiene and checkup ? ?Vaccinations: ?Influenza vaccine: Due Repeat annually ? ?Pneumococcal vaccine: Done 06/04/21 and 03/17/2018 ?Tdap vaccine: Done 12/09/2013 Repeat in 10 years ? ?Shingles vaccine: Done 02/27/2014. Discussed Shingrix.   ?Covid-19: Done 07/14/19 and 08/09/2019 ? ?Advanced directives: Please bring a copy of your health care power of attorney and living will to the office to be added to your chart at your convenience. ? ? ?Conditions/risks identified: Aim for 30 minutes of exercise or brisk walking, 6-8 glasses of water, and 5 servings of fruits and vegetables each day. ? ? ?Next appointment: Follow up in one year for your annual wellness visit. 2024. ? ?Preventive Care 50 Years and Older, Male ? ?Preventive care refers to lifestyle choices and visits with your health care provider that can promote health and wellness. ?What does preventive care include? ?A yearly physical exam. This is also called an annual well check. ?Dental exams once or twice a year. ?Routine eye exams. Ask your health care provider how often you should have your eyes checked. ?Personal lifestyle choices, including: ?Daily care of your teeth and gums. ?Regular physical activity. ?Eating a healthy diet. ?Avoiding tobacco and drug use. ?Limiting alcohol use. ?Practicing safe sex. ?Taking low doses of aspirin every day. ?Taking vitamin and mineral supplements as recommended by your health care provider. ?What happens during an annual well check? ?The services and screenings done by your health  care provider during your annual well check will depend on your age, overall health, lifestyle risk factors, and family history of disease. ?Counseling  ?Your health care provider may ask you questions about your: ?Alcohol use. ?Tobacco use. ?Drug use. ?Emotional well-being. ?Home and relationship well-being. ?Sexual activity. ?Eating habits. ?History of falls. ?Memory and ability to understand (cognition). ?Work and work Statistician. ?Screening  ?You may have the following tests or measurements: ?Height, weight, and BMI. ?Blood pressure. ?Lipid and cholesterol levels. These may be checked every 5 years, or more frequently if you are over 77 years old. ?Skin check. ?Lung cancer screening. You may have this screening every year starting at age 45 if you have a 30-pack-year history of smoking and currently smoke or have quit within the past 15 years. ?Fecal occult blood test (FOBT) of the stool. You may have this test every year starting at age 44. ?Flexible sigmoidoscopy or colonoscopy. You may have a sigmoidoscopy every 5 years or a colonoscopy every 10 years starting at age 29. ?Prostate cancer screening. Recommendations will vary depending on your family history and other risks. ?Hepatitis C blood test. ?Hepatitis B blood test. ?Sexually transmitted disease (STD) testing. ?Diabetes screening. This is done by checking your blood sugar (glucose) after you have not eaten for a while (fasting). You may have this done every 1-3 years. ?Abdominal aortic aneurysm (AAA) screening. You may need this if you are a current or former smoker. ?Osteoporosis. You may be screened starting at age 15 if you are at high risk. ?Talk with your health care provider about your test results, treatment options, and if necessary, the need for more tests. ?Vaccines  ?Your health care provider may  recommend certain vaccines, such as: ?Influenza vaccine. This is recommended every year. ?Tetanus, diphtheria, and acellular pertussis (Tdap, Td)  vaccine. You may need a Td booster every 10 years. ?Zoster vaccine. You may need this after age 31. ?Pneumococcal 13-valent conjugate (PCV13) vaccine. One dose is recommended after age 38. ?Pneumococcal polysaccharide (PPSV23) vaccine. One dose is recommended after age 44. ?Talk to your health care provider about which screenings and vaccines you need and how often you need them. ?This information is not intended to replace advice given to you by your health care provider. Make sure you discuss any questions you have with your health care provider. ?Document Released: 05/24/2015 Document Revised: 01/15/2016 Document Reviewed: 02/26/2015 ?Elsevier Interactive Patient Education ? 2017 [place]. ? ?Fall Prevention in the Home ?Falls can cause injuries. They can happen to people of all ages. There are many things you can do to make your home safe and to help prevent falls. ?What can I do on the outside of my home? ?Regularly fix the edges of walkways and driveways and fix any cracks. ?Remove anything that might make you trip as you walk through a door, such as a raised step or threshold. ?Trim any bushes or trees on the path to your home. ?Use bright outdoor lighting. ?Clear any walking paths of anything that might make someone trip, such as rocks or tools. ?Regularly check to see if handrails are loose or broken. Make sure that both sides of any steps have handrails. ?Any raised decks and porches should have guardrails on the edges. ?Have any leaves, snow, or ice cleared regularly. ?Use sand or salt on walking paths during winter. ?Clean up any spills in your garage right away. This includes oil or grease spills. ?What can I do in the bathroom? ?Use night lights. ?Install grab bars by the toilet and in the tub and shower. Do not use towel bars as grab bars. ?Use non-skid mats or decals in the tub or shower. ?If you need to sit down in the shower, use a plastic, non-slip stool. ?Keep the floor dry. Clean up any  water that spills on the floor as soon as it happens. ?Remove soap buildup in the tub or shower regularly. ?Attach bath mats securely with double-sided non-slip rug tape. ?Do not have throw rugs and other things on the floor that can make you trip. ?What can I do in the bedroom? ?Use night lights. ?Make sure that you have a light by your bed that is easy to reach. ?Do not use any sheets or blankets that are too big for your bed. They should not hang down onto the floor. ?Have a firm chair that has side arms. You can use this for support while you get dressed. ?Do not have throw rugs and other things on the floor that can make you trip. ?What can I do in the kitchen? ?Clean up any spills right away. ?Avoid walking on wet floors. ?Keep items that you use a lot in easy-to-reach places. ?If you need to reach something above you, use a strong step stool that has a grab bar. ?Keep electrical cords out of the way. ?Do not use floor polish or wax that makes floors slippery. If you must use wax, use non-skid floor wax. ?Do not have throw rugs and other things on the floor that can make you trip. ?What can I do with my stairs? ?Do not leave any items on the stairs. ?Make sure that there are handrails on both sides of  the stairs and use them. Fix handrails that are broken or loose. Make sure that handrails are as long as the stairways. ?Check any carpeting to make sure that it is firmly attached to the stairs. Fix any carpet that is loose or worn. ?Avoid having throw rugs at the top or bottom of the stairs. If you do have throw rugs, attach them to the floor with carpet tape. ?Make sure that you have a light switch at the top of the stairs and the bottom of the stairs. If you do not have them, ask someone to add them for you. ?What else can I do to help prevent falls? ?Wear shoes that: ?Do not have high heels. ?Have rubber bottoms. ?Are comfortable and fit you well. ?Are closed at the toe. Do not wear sandals. ?If you use a  stepladder: ?Make sure that it is fully opened. Do not climb a closed stepladder. ?Make sure that both sides of the stepladder are locked into place. ?Ask someone to hold it for you, if possible. ?Clearly ma

## 2021-08-07 NOTE — Progress Notes (Addendum)
? ?Subjective:  ? Thomas Livingston is a 71 y.o. male who presents for Medicare Annual/Subsequent preventive examination. ?Virtual Visit via Telephone Note ? ?I connected with  Thomas Livingston on 08/07/21 at  9:45 AM EDT by telephone and verified that I am speaking with the correct person using two identifiers. ? ?Location: ?Patient: HOME ?Provider: [facility] ?Persons participating in the virtual visit: patient/Nurse Health Advisor ?  ?I discussed the limitations, risks, security and privacy concerns of performing an evaluation and management service by telephone and the availability of in person appointments. The patient expressed understanding and agreed to proceed. ? ?Interactive audio and video telecommunications were attempted between this nurse and patient, however failed, due to patient having technical difficulties OR patient did not have access to video capability.  We continued and completed visit with audio only. ? ?Some vital signs may be absent or patient reported.  ? ?Chriss Driver, LPN ? ? ?Review of Systems    ? ?Cardiac Risk Factors include: advanced age (>61mn, >>66women);hypertension;male gender;dyslipidemia;sedentary lifestyle;Other (see comment), Risk factor comments: Asthma ? ?   ?Objective:  ?  ?Today's Vitals  ? 08/07/21 0946  ?Weight: 227 lb (103 kg)  ?Height: '6\' 2"'$  (1.88 m)  ? ?Body mass index is 29.15 kg/m?. ? ? ?  08/07/2021  ?  9:52 AM 05/13/2021  ? 10:56 AM 11/27/2020  ?  9:51 PM 09/25/2020  ?  4:40 AM 08/13/2020  ?  6:01 PM 07/31/2020  ?  8:33 AM 03/21/2018  ?  2:40 PM  ?Advanced Directives  ?Does Patient Have a Medical Advance Directive? Yes Yes No No No Yes No  ?Type of [place]amed[place]of [place] will [place] will       ?Does patient want to make changes to medical advance directive?      No - Patient declined   ?Copy of H[place]in Chart? No - copy requested No - copy requested       ?Would patient like information  on creating a medical advance directive? No - Patient declined   No - Patient declined   No - Patient declined  ? ? ?Current Medications (verified) ?Outpatient Encounter Medications as of 08/07/2021  ?Medication Sig  ? albuterol (VENTOLIN HFA) 108 (90 Base) MCG/ACT inhaler Inhale 2 puffs into the lungs every 6 (six) hours as needed for wheezing or shortness of breath.  ? aspirin EC 81 MG tablet Take 81 mg by mouth in the morning. Swallow whole.  ? doxycycline (VIBRA-TABS) 100 MG tablet Take 1 tablet (100 mg total) by mouth 2 (two) times daily.  ? EPINEPHrine 0.3 mg/0.3 mL IJ SOAJ injection Inject 0.3 mg into the muscle as needed for anaphylaxis.  ? fexofenadine (ALLEGRA) 180 MG tablet Take 180 mg by mouth daily. [place] Signature Aller-Fex Antihistamine 180 mg  ? fluticasone (FLONASE) 50 MCG/ACT nasal spray Use 2 sprays in each  nostril daily (Patient taking differently: 2 sprays every evening. Use 2 sprays in each  nostril daily)  ? fluticasone-salmeterol (ADVAIR HFA) 115-21 MCG/ACT inhaler Inhale 2 puffs into the lungs 2 (two) times daily.  ? Homeopathic Products (COLD REMEDY PO) Take 2 tablets by mouth every 2 (two) hours as needed (cold symptoms).  ? Multiple Vitamin (MULTIVITAMIN) tablet Take 1 tablet by mouth daily.  ? rosuvastatin (CRESTOR) 10 MG tablet Take 1 tablet (10 mg total) by mouth daily.  ? ?No facility-administered encounter medications on file as of 08/07/2021.  ? ? ?  Allergies (verified) ?Meat [alpha-gal], Nylon, Lotrel [amlodipine besy-benazepril hcl], Other, and Chalk  ? ?History: ?Past Medical History:  ?Diagnosis Date  ? Allergy   ? Arthritis   ? left hip  ? Asthma   ? History of kidney stones   ? Hyperlipidemia   ? Hypertension 2012  ? no bp meds since 2012  ? Multiple lung nodules on CT 2007  ? no current follow up done  ? Psoriasis   ? both legs  ? Urticaria   ? Urticaria 03/31/2018  ? ?Past Surgical History:  ?Procedure Laterality Date  ? BRONCHIAL WASHINGS  05/13/2021  ? Procedure: BRONCHIAL  WASHINGS;  Surgeon: Maryjane Hurter, MD;  Location: [facility];  Service: Pulmonary;;  ? CHOLECYSTECTOMY  2007  ? ENDOBRONCHIAL ULTRASOUND N/A 05/13/2021  ? Procedure: ENDOBRONCHIAL ULTRASOUND;  Surgeon: Maryjane Hurter, MD;  Location: [facility];  Service: Pulmonary;  Laterality: N/A;  ? EUS N/A 06/28/2014  ? Procedure: LOWER ENDOSCOPIC ULTRASOUND (EUS);  Surgeon: Milus Banister, MD;  Location: [facility];  Service: Endoscopy;  Laterality: N/A;  ? EXTRACORPOREAL SHOCK WAVE LITHOTRIPSY N/A 03/21/2018  ? Procedure: EXTRACORPOREAL SHOCK WAVE LITHOTRIPSY (ESWL);  Surgeon: Irine Seal, MD;  Location: [facility];  Service: Urology;  Laterality: N/A;  ? FINE NEEDLE ASPIRATION  05/13/2021  ? Procedure: FINE NEEDLE ASPIRATION (FNA) LINEAR;  Surgeon: Maryjane Hurter, MD;  Location: [facility];  Service: Pulmonary;;  ? HIP ARTHROPLASTY Left 2018  ? [place]  ? PROCTOSCOPY N/A 08/27/2014  ? Procedure: PROCTOSCOPY;  Surgeon: Leighton Ruff, MD;  [facility]: [facility];  Service: General;  Laterality: N/A;  ? TRANSANAL RECTAL RESECTION N/A 08/27/2014  ? Procedure: TRANSANAL RESECTION OF RECTAL TUMOR;  Surgeon: Leighton Ruff, MD;  [facility];  Service: General;  Laterality: N/A;  ? VIDEO BRONCHOSCOPY N/A 05/13/2021  ? Procedure: VIDEO BRONCHOSCOPY WITHOUT FLUORO;  Surgeon: Maryjane Hurter, MD;  Location: [facility];  Service: Pulmonary;  Laterality: N/A;  ? ?Family History  ?Problem Relation Age of Onset  ? Thyroid cancer Mother   ? Lung cancer Father   ? Colon cancer Neg Hx   ? ?Social History  ? ?Socioeconomic History  ? Marital status: Married  ?  Spouse name: Not on file  ? Number of children: Not on file  ? Years of education: Not on file  ? Highest education level: Not on file  ?Occupational History  ? Not on file  ?Tobacco Use  ? Smoking status: Never  ? Smokeless tobacco: Never  ?Vaping Use  ? Vaping Use: Never used  ?Substance and Sexual Activity  ? Alcohol use: No  ? Drug use: No  ? Sexual activity:  Not on file  ?Other Topics Concern  ? Not on file  ?Social History Narrative  ? Not on file  ? ?Social Determinants of Health  ? ?Financial Resource Strain: Low Risk   ? Difficulty of Paying Living Expenses: Not hard at all  ?Food Insecurity: No Food Insecurity  ? Worried About Charity fundraiser in the Last Year: Never true  ? Ran Out of Food in the Last Year: Never true  ?Transportation Needs: No Transportation Needs  ? Lack of Transportation (Medical): No  ? Lack of Transportation (Non-Medical): No  ?Physical Activity: Insufficiently Active  ? Days of Exercise per Week: 3 days  ? Minutes of Exercise per Session: 30 min  ?Stress: No Stress Concern Present  ? Feeling of Stress : Not at all  ?Social  Connections: Socially Integrated  ? Frequency of Communication with Friends and Family: More than three times a week  ? Frequency of Social Gatherings with Friends and Family: More than three times a week  ? Attends Religious Services: More than 4 times per year  ? Active Member of Clubs or Organizations: Yes  ? Attends Archivist Meetings: More than 4 times per year  ? Marital Status: Married  ? ? ?Tobacco Counseling ?Counseling given: Not Answered ? ? ?Clinical Intake: ? ?Pre-visit preparation completed: Yes ? ?Pain : No/denies pain ? ?  ? ?BMI - recorded: 29.15 ?Nutritional Status: BMI 25 -29 Overweight ?Nutritional Risks: None ?Diabetes: No ? ?How often do you need to have someone help you when you read instructions, pamphlets, or other written materials from your doctor or pharmacy?: 1 - Never ? ?Diabetic?NO ? ?Interpreter Needed?: No ? ?Information entered by :: mj Amery Vandenbos, lpn ? ? ?Activities of Daily Living ? ?  08/07/2021  ?  9:55 AM  ?In your present state of health, do you have any difficulty performing the following activities:  ?Hearing? 1  ?Vision? 0  ?Difficulty concentrating or making decisions? 0  ?Walking or climbing stairs? 0  ?Dressing or bathing? 0  ?Doing errands, shopping? 0  ?Preparing  Food and eating ? N  ?Using the Toilet? N  ?In the past six months, have you accidently leaked urine? N  ?Do you have problems with loss of bowel control? N  ?Managing your Medications? N  ?Managing yo

## 2021-08-20 ENCOUNTER — Other Ambulatory Visit: Payer: Self-pay | Admitting: Family Medicine

## 2021-09-17 DIAGNOSIS — N5201 Erectile dysfunction due to arterial insufficiency: Secondary | ICD-10-CM | POA: Diagnosis not present

## 2021-09-17 DIAGNOSIS — R6882 Decreased libido: Secondary | ICD-10-CM | POA: Diagnosis not present

## 2021-09-30 ENCOUNTER — Ambulatory Visit
Admission: RE | Admit: 2021-09-30 | Discharge: 2021-09-30 | Disposition: A | Discharge status: Home / Self Care | Payer: Medicare Other | Source: Ambulatory Visit | Attending: Student | Admitting: Student

## 2021-09-30 DIAGNOSIS — J849 Interstitial pulmonary disease, unspecified: Secondary | ICD-10-CM

## 2021-09-30 DIAGNOSIS — I2510 Atherosclerotic heart disease of native coronary artery without angina pectoris: Secondary | ICD-10-CM | POA: Diagnosis not present

## 2021-09-30 DIAGNOSIS — J479 Bronchiectasis, uncomplicated: Secondary | ICD-10-CM | POA: Diagnosis not present

## 2021-09-30 DIAGNOSIS — J929 Pleural plaque without asbestos: Secondary | ICD-10-CM | POA: Diagnosis not present

## 2021-10-13 NOTE — Progress Notes (Unsigned)
Synopsis: Referred for sarcoidosis by Susy Frizzle, MD  Subjective:   PATIENT ID: Thomas Livingston GENDER: male DOB: [birth year], MRN: [id number]  No chief complaint on file.  71yM with history of sarcoidosis, asthma, alpha gal allergy followed by Dr. Neldon Mc, allergy, pulmonary nodules, HTN who is referred for recurring lung infection wondering if he has pneumonia, frequently coughing up phlegm, plugs.   He thinks he picked up a cold again from a sick contact. Has had 2 courses of steroids/ABX with some improvement after each course but not back to baseline. On third round of ABX currently taking augmentin and azithromycin. Hasn't yet tried spiriva inhaler.   Was diagnosed with sarcoid in 2007 based on hilar adenopathy but never had biopsy. 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 He never was treated for sarcoid, never was symptomatic.  Has a brother that died of pulmonary fibrosis that he attributes to agent orange exposure. Wife has sarcoid.  Did work for [employer] and didn't wear mask when working. Never smoked. Has never lived outside of [place]. No pets at home. He hunts and dresses deer.  Interval HPI:  Continued on advair 2 puffs BID. Stable HRCT Chest  Otherwise pertinent review of systems is negative.   Past Medical History:  Diagnosis Date   Allergy    Arthritis    left hip   Asthma    History of kidney stones    Hyperlipidemia    Hypertension 2012   no bp meds since 2012   Multiple lung nodules on CT 2007   no current follow up done   Psoriasis    both legs   Urticaria    Urticaria 03/31/2018     Family History  Problem Relation Age of Onset   Thyroid cancer Mother    Lung cancer Father    Colon cancer Neg Hx      Past Surgical History:  Procedure Laterality Date   BRONCHIAL WASHINGS  05/13/2021   Procedure: BRONCHIAL WASHINGS;  Surgeon: Maryjane Hurter, MD;  Location: [facility];  Service: Pulmonary;;   CHOLECYSTECTOMY  2007   ENDOBRONCHIAL ULTRASOUND N/A 05/13/2021   Procedure: ENDOBRONCHIAL ULTRASOUND;  Surgeon: Maryjane Hurter, MD;  Location: [facility];  Service: Pulmonary;  Laterality: N/A;   EUS N/A 06/28/2014   Procedure: LOWER ENDOSCOPIC ULTRASOUND (EUS);  Surgeon: Milus Banister, MD;  Location: [facility];  Service: Endoscopy;  Laterality: N/A;   EXTRACORPOREAL SHOCK WAVE LITHOTRIPSY N/A 03/21/2018   Procedure: EXTRACORPOREAL SHOCK WAVE LITHOTRIPSY (ESWL);  Surgeon: Irine Seal, MD;  Location: [facility];  Service: Urology;  Laterality: N/A;   FINE NEEDLE ASPIRATION  05/13/2021   Procedure: FINE NEEDLE ASPIRATION (FNA) LINEAR;  Surgeon: Maryjane Hurter, MD;  [facility];  Service: Pulmonary;;   HIP ARTHROPLASTY Left 2018   MOUTH SURGERY  1972   PROCTOSCOPY N/A 08/27/2014   Procedure: PROCTOSCOPY;  Surgeon: Leighton Ruff, MD;  Location: [facility];  Service: General;  Laterality: N/A;   TRANSANAL RECTAL RESECTION N/A 08/27/2014   Procedure: TRANSANAL RESECTION OF RECTAL TUMOR;  Surgeon: Leighton Ruff, MD;  [facility];  Service: General;  Laterality: N/A;   VIDEO BRONCHOSCOPY N/A 05/13/2021   Procedure: VIDEO BRONCHOSCOPY WITHOUT FLUORO;  Surgeon: Maryjane Hurter, MD;  [facility];  Service: Pulmonary;  Laterality: N/A;    Social History   Socioeconomic History   Marital status: Married  Spouse name: Not on file   Number of children: Not on file   Years of education: Not on file   Highest education level: Not on file  Occupational History   Not on file  Tobacco Use   Smoking status: Never   Smokeless tobacco: Never  Vaping Use   Vaping Use: Never used  Substance and Sexual Activity   Alcohol use: No   Drug use: No   Sexual activity: Not on file  Other Topics Concern   Not on file  Social History Narrative   Not on file   Social Determinants of Health   Financial Resource Strain: Low Risk    Difficulty  of Paying Living Expenses: Not hard at all  Food Insecurity: No Food Insecurity   Worried About Running Out of Food in the Last Year: Never true   [place] in the Last Year: Never true  Transportation Needs: No Transportation Needs   Lack of Transportation (Medical): No   Lack of Transportation (Non-Medical): No  Physical Activity: Insufficiently Active   Days of Exercise per Week: 3 days   Minutes of Exercise per Session: 30 min  Stress: No Stress Concern Present   Feeling of Stress : Not at all  Social Connections: Socially Integrated   Frequency of Communication with Friends and Family: More than three times a week   Frequency of Social Gatherings with Friends and Family: More than three times a week   Attends Religious Services: More than 4 times per year   Active Member of [employer] or Organizations: Yes   Attends Music therapist: More than 4 times per year   Marital Status: Married  Human resources officer Violence: Not At Risk   Fear of Current or Ex-Partner: No   Emotionally Abused: No   Physically Abused: No   Sexually Abused: No     Allergies  Allergen Reactions   Meat [Alpha-Gal] Anaphylaxis   Nylon Rash   Lotrel [Amlodipine Besy-Benazepril Hcl] Hives and Swelling   Other Swelling    lycra    Chalk Other (See Comments)    sneezing     Outpatient Medications Prior to Visit  Medication Sig Dispense Refill   albuterol (VENTOLIN HFA) 108 (90 Base) MCG/ACT inhaler Inhale 2 puffs into the lungs every 6 (six) hours as needed for wheezing or shortness of breath. 8 g 0   aspirin EC 81 MG tablet Take 81 mg by mouth in the morning. Swallow whole.     doxycycline (VIBRA-TABS) 100 MG tablet Take 1 tablet (100 mg total) by mouth 2 (two) times daily. 14 tablet 0   EPINEPHrine 0.3 mg/0.3 mL IJ SOAJ injection Inject 0.3 mg into the muscle as needed for anaphylaxis. 2 each 1   fexofenadine (ALLEGRA) 180 MG tablet Take 180 mg by mouth daily. [place] Signature Aller-Fex  Antihistamine 180 mg     fluticasone (FLONASE) 50 MCG/ACT nasal spray Use 2 sprays in each  nostril daily (Patient taking differently: 2 sprays every evening. Use 2 sprays in each  nostril daily) 48 g 3   fluticasone-salmeterol (ADVAIR HFA) 115-21 MCG/ACT inhaler Inhale 2 puffs into the lungs 2 (two) times daily. 1 each 12   Homeopathic Products (COLD REMEDY PO) Take 2 tablets by mouth every 2 (two) hours as needed (cold symptoms).     Multiple Vitamin (MULTIVITAMIN) tablet Take 1 tablet by mouth daily.     rosuvastatin (CRESTOR) 10 MG tablet TAKE 1 TABLET BY MOUTH EVERY DAY 90  tablet 0   No facility-administered medications prior to visit.       Objective:   Physical Exam:  General appearance: 71 y.o., male, NAD, conversant  Eyes: anicteric sclerae; PERRL, tracking appropriately HENT: NCAT; MMM Neck: Trachea midline; no lymphadenopathy, no JVD Lungs: Good air movement, with normal respiratory effort CV: RRR, no murmur  Abdomen: Soft, non-tender; non-distended, BS present  Extremities: No peripheral edema, warm Skin: Normal turgor and texture; no rash Psych: Appropriate affect Neuro: Alert and oriented to person and place, no focal deficit     There were no vitals filed for this visit.     on RA BMI Readings from Last 3 Encounters:  08/07/21 29.15 kg/m  07/02/21 29.68 kg/m  05/13/21 29.40 kg/m   Wt Readings from Last 3 Encounters:  08/07/21 227 lb (103 kg)  07/02/21 231 lb 3.2 oz (104.9 kg)  05/13/21 229 lb (103.9 kg)     CBC    Component Value Date/Time   WBC 7.4 07/31/2020 0857   RBC 4.62 07/31/2020 0857   HGB 14.4 07/31/2020 0857   HGB 15.5 09/01/2017 1048   HCT 43.1 07/31/2020 0857   HCT 45.6 09/01/2017 1048   PLT 258 07/31/2020 0857   PLT 252 09/01/2017 1048   MCV 93.3 07/31/2020 0857   MCV 91 09/01/2017 1048   MCH 31.2 07/31/2020 0857   MCHC 33.4 07/31/2020 0857   RDW 12.3 07/31/2020 0857   RDW 13.9 09/01/2017 1048   LYMPHSABS 977 07/31/2020  0857   LYMPHSABS 1.5 09/01/2017 1048   MONOABS 1,092 (H) 02/20/2016 1249   EOSABS 496 07/31/2020 0857   EOSABS 0.4 09/01/2017 1048   BASOSABS 59 07/31/2020 0857   BASOSABS 0.0 09/01/2017 1048    Chest Imaging: CT Chest 04/11/2021 reviewed by me remarkable for calcified mediastinal LAD, bronchovascular thickening and bronchial stenosis, pulmonary nodules. Airways disease has progressed significantly and nodes are now calcified to greater extent  Pulmonary Functions Testing Results:    Latest Ref Rng & Units 07/02/2021    8:56 AM  PFT Results  FVC-Pre L 4.35    FVC-Predicted Pre % 86    FVC-Post L 4.52    FVC-Predicted Post % 90    Pre FEV1/FVC % % 64    Post FEV1/FCV % % 68    FEV1-Pre L 2.78    FEV1-Predicted Pre % 74    FEV1-Post L 3.09    DLCO uncorrected ml/min/mmHg 22.61    DLCO UNC% % 78    DLCO corrected ml/min/mmHg 22.61    DLCO COR %Predicted % 78    DLVA Predicted % 91    TLC L 6.59    TLC % Predicted % 86    RV % Predicted % 82          Assessment & Plan:   # Mild persistent asthma # Mediastinal LAD # Bronchial wall thickening and bronchial stenosis # Multiple pulmonary nodules Concerned that he may have sarcoidosis and/or fibrosing mediastinitis. EBUS, BAL unrevealing for any particular etiology. He may have superimposed atypical infection but my suspicion is relatively low for this. PFTs today with reversible obstruction, essentially positive BD response.   Plan: - continue advair 2 puffs twice daily rinsing mouth after use - consideration of steroid course if progressive decline in lung function, radiographic, and/or symptomatic progression - HRCT in 3 months - spirometry next visit - RTC 4 months    Maryjane Hurter, MD [facility] 10/13/2021 4:18 PM

## 2021-10-14 ENCOUNTER — Other Ambulatory Visit: Payer: Self-pay

## 2021-10-14 DIAGNOSIS — J849 Interstitial pulmonary disease, unspecified: Secondary | ICD-10-CM

## 2021-10-15 ENCOUNTER — Ambulatory Visit (INDEPENDENT_AMBULATORY_CARE_PROVIDER_SITE_OTHER): Payer: Medicare Other | Admitting: Student

## 2021-10-15 ENCOUNTER — Encounter: Payer: Self-pay | Admitting: Student

## 2021-10-15 ENCOUNTER — Ambulatory Visit: Payer: Medicare Other | Admitting: Student

## 2021-10-15 VITALS — BP 120/72 | HR 53 | Temp 98.2°F | Ht 73.5 in | Wt 229.8 lb

## 2021-10-15 DIAGNOSIS — J849 Interstitial pulmonary disease, unspecified: Secondary | ICD-10-CM

## 2021-10-15 LAB — PULMONARY FUNCTION TEST
FEF 25-75 Pre: 1.72 L/sec
FEF2575-%Pred-Pre: 60 %
FEV1-%Pred-Pre: 73 %
FEV1-Pre: 2.72 L
FEV1FVC-%Pred-Pre: 88 %
FEV6-%Pred-Pre: 86 %
FEV6-Pre: 4.09 L
FEV6FVC-%Pred-Pre: 104 %
FVC-%Pred-Pre: 82 %
FVC-Pre: 4.14 L
Pre FEV1/FVC ratio: 66 %
Pre FEV6/FVC Ratio: 99 %

## 2021-10-15 MED ORDER — FLUTICASONE-SALMETEROL 115-21 MCG/ACT IN AERO: 2.0000 | INHALATION_SPRAY | Freq: Two times a day (BID) | RESPIRATORY_TRACT | 12 refills | Status: DC

## 2021-10-15 NOTE — Patient Instructions (Signed)
Spirometry Performed Today.  

## 2021-10-15 NOTE — Progress Notes (Signed)
Spirometry Performed Today.  

## 2021-10-15 NOTE — Patient Instructions (Addendum)
-   Please call or send message if you feel like you're developing pneumonia - In office spirometry next visit - Keep using advair 2 puffs twice daily, rinse mouth after use - breathing tests next visit - see you in January 2024!

## 2021-10-25 ENCOUNTER — Other Ambulatory Visit: Payer: Self-pay | Admitting: Family Medicine

## 2021-10-27 NOTE — Telephone Encounter (Signed)
    Requested medication (s) are due for refill today: yes  Requested medication (s) are on the active medication list: yes  Last refill:  08/20/21 #90 with 0 RF  Future visit scheduled: no, last seen 05/02/21  Notes to clinic:  failed protocol due to labs are from 07/31/2020, no upcoming appt, please assess.  Requested Prescriptions  Pending Prescriptions Disp Refills   rosuvastatin (CRESTOR) 10 MG tablet [Pharmacy Med Name: ROSUVASTATIN CALCIUM 10 MG TAB] 90 tablet 0    Sig: TAKE 1 TABLET BY MOUTH EVERY DAY     Cardiovascular:  Antilipid - Statins 2 Failed - 10/27/2021 11:59 AM      Failed - Cr in normal range and within 360 days    Creat  Date Value Ref Range Status  07/31/2020 0.94 0.70 - 1.25 mg/dL Final    Comment:    For patients >33 years of age, the reference limit for Creatinine is approximately 13% higher for people identified as African-American. .          Failed - Lipid Panel in normal range within the last 12 months    Cholesterol  Date Value Ref Range Status  07/31/2020 133 <200 mg/dL Final   LDL Cholesterol (Calc)  Date Value Ref Range Status  07/31/2020 78 mg/dL (calc) Final    Comment:    Reference range: <100 . Desirable range <100 mg/dL for primary prevention;   <70 mg/dL for patients with CHD or diabetic patients  with > or = 2 CHD risk factors. Marland Kitchen LDL-C is now calculated using the Martin-Hopkins  calculation, which is a validated novel method providing  better accuracy than the Friedewald equation in the  estimation of LDL-C.  Cresenciano Genre et al. Annamaria Helling. 0569;794(80): 2061-2068  (http://education.[facility].com/faq/FAQ164)    HDL  Date Value Ref Range Status  07/31/2020 36 (L) > OR = 40 mg/dL Final   Triglycerides  Date Value Ref Range Status  07/31/2020 109 <150 mg/dL Final         Passed - Patient is not pregnant      Passed - Valid encounter within last 12 months    Recent Outpatient Visits           5 months ago Sarcoidosis    [place] Susy Frizzle, MD   7 months ago Opacities of both lungs present on chest x-ray   [place] Eulogio Bear, NP   8 months ago Acute bronchitis due to other specified organisms   [place], Cammie Mcgee, MD   1 year ago General medical exam   [facility] Susy Frizzle, MD   1 year ago Acute left otitis media   [place], [place], FNP

## 2021-10-29 DIAGNOSIS — R351 Nocturia: Secondary | ICD-10-CM | POA: Diagnosis not present

## 2021-10-29 DIAGNOSIS — N401 Enlarged prostate with lower urinary tract symptoms: Secondary | ICD-10-CM | POA: Diagnosis not present

## 2021-10-29 DIAGNOSIS — N5201 Erectile dysfunction due to arterial insufficiency: Secondary | ICD-10-CM | POA: Diagnosis not present

## 2021-10-29 DIAGNOSIS — N486 Induration penis plastica: Secondary | ICD-10-CM | POA: Diagnosis not present

## 2021-11-20 ENCOUNTER — Telehealth: Payer: Self-pay | Admitting: Pharmacist

## 2021-11-20 NOTE — Progress Notes (Signed)
[facility])                                            [place] Team    11/20/2021  CURREN MOHRMANN 25-Jul-1950 [id number]  Placed telephone outreach to Mr. Dontay Harm to discuss rosuvastatin. Patient last filled rosuvastatin #90 tablets 08/20/2021. A new prescription was issued to patient's pharmacy on 10/27/2021 #30 tabs, however prescription was not refilled. Noted that patient needs to follow up with PCP prior to future refills.   Will attempt to outreach patient again in 1 to 3 business days.

## 2021-12-09 NOTE — Progress Notes (Signed)
[facility])                                            [place] Team    12/09/2021  Thomas Livingston Mar 29, 1951 [id number]  [name] has filled rosuvastatin 10 mg #30 day supply as of 12/03/2021. Per review of patient's chart, he will need to be seen in the office prior to any future refills.   Loretha Brasil, PharmD [place] Pharmacist Office: [phone]

## 2021-12-25 ENCOUNTER — Other Ambulatory Visit: Payer: Self-pay

## 2021-12-25 NOTE — Telephone Encounter (Signed)
Pharmacy faxed a refill request for rosuvastatin (CRESTOR) 10 MG tablet [[id number]]    Order Details Dose, Route, Frequency: As Directed  Dispense Quantity: 30 tablet Refills: 0   Note to Pharmacy: PT NEEDS OV FOR FUTURE REFILLS       Sig: TAKE 1 TABLET BY MOUTH EVERY DAY       Start Date: 10/27/21 End Date: --  Written Date: 10/27/21 Expiration Date: 10/27/22

## 2021-12-25 NOTE — Telephone Encounter (Signed)
Called patient, scheduled OV for 12/30/21.

## 2021-12-26 MED ORDER — ROSUVASTATIN CALCIUM 10 MG PO TABS: 10.0000 mg | ORAL_TABLET | Freq: Every day | ORAL | 0 refills | Status: DC

## 2021-12-26 NOTE — Telephone Encounter (Signed)
Requested medication (s) are due for refill today: yes  Requested medication (s) are on the active medication list: yes  Last refill:  11/03/21, 30 days, 0 RF  Future visit scheduled: yes  Notes to clinic:  Unable to refill per protocol, courtesy refill already given, routing for provider approval. Pt has future OV scheduled for 12/30/21.     Requested Prescriptions  Pending Prescriptions Disp Refills   rosuvastatin (CRESTOR) 10 MG tablet 30 tablet 0    Sig: Take 1 tablet (10 mg total) by mouth daily.     Cardiovascular:  Antilipid - Statins 2 Failed - 12/25/2021  2:02 PM      Failed - Cr in normal range and within 360 days    Creat  Date Value Ref Range Status  07/31/2020 0.94 0.70 - 1.25 mg/dL Final    Comment:    For patients >42 years of age, the reference limit for Creatinine is approximately 13% higher for people identified as African-American. .          Failed - Lipid Panel in normal range within the last 12 months    Cholesterol  Date Value Ref Range Status  07/31/2020 133 <200 mg/dL Final   LDL Cholesterol (Calc)  Date Value Ref Range Status  07/31/2020 78 mg/dL (calc) Final    Comment:    Reference range: <100 . Desirable range <100 mg/dL for primary prevention;   <70 mg/dL for patients with CHD or diabetic patients  with > or = 2 CHD risk factors. Marland Kitchen LDL-C is now calculated using the Martin-Hopkins  calculation, which is a validated novel method providing  better accuracy than the Friedewald equation in the  estimation of LDL-C.  Cresenciano Genre et al. Annamaria Helling. 6734;193(79): 2061-2068  (http://education.[facility].com/faq/FAQ164)    HDL  Date Value Ref Range Status  07/31/2020 36 (L) > OR = 40 mg/dL Final   Triglycerides  Date Value Ref Range Status  07/31/2020 109 <150 mg/dL Final         Passed - Patient is not pregnant      Passed - Valid encounter within last 12 months    Recent Outpatient Visits           7 months ago Sarcoidosis    [place] Susy Frizzle, MD   9 months ago Opacities of both lungs present on chest x-ray   [place] Eulogio Bear, NP   10 months ago Acute bronchitis due to other specified organisms   [place] Pickard, Cammie Mcgee, MD   1 year ago General medical exam   [place] Susy Frizzle, MD   2 years ago Acute left otitis media   [place], [place], FNP       Future Appointments             In 4 days Pickard, Cammie Mcgee, MD [place]

## 2021-12-30 ENCOUNTER — Ambulatory Visit (INDEPENDENT_AMBULATORY_CARE_PROVIDER_SITE_OTHER): Payer: Medicare Other | Admitting: Family Medicine

## 2021-12-30 VITALS — BP 118/72 | HR 56 | Temp 98.8°F | Ht 73.0 in | Wt 228.8 lb

## 2021-12-30 DIAGNOSIS — E7800 Pure hypercholesterolemia, unspecified: Secondary | ICD-10-CM

## 2021-12-30 DIAGNOSIS — J4540 Moderate persistent asthma, uncomplicated: Secondary | ICD-10-CM

## 2021-12-30 NOTE — Progress Notes (Signed)
Subjective:    Patient ID: Thomas Livingston, male    DOB: 21-Feb-1951, 71 y.o.   MRN: [id number] Recent CT of Chest- Mediastinum/Nodes: There are multiple borderline enlarged and enlarged mediastinal and bilateral hilar lymph nodes, measuring up to 2.1 cm in short axis in the low right paratracheal nodal station, many of which are densely calcified. This appears relatively similar to the prior study from 2007. Esophagus is unremarkable in appearance. No axillary lymphadenopathy.   Lungs/Pleura: There is profound thickening of the peribronchovascular interstitium scattered throughout the lungs bilaterally, which has increased compared to the prior examination, and is associated with widespread nodularity in the lungs, most evident in both a peribronchovascular and subpleural distribution (i.e., the overall distribution is perilymphatic). This nodularity is widespread in the lungs,, with particular involvement of the perihilar aspects of the mid to upper lungs, and extensive involvement of the right middle lobe where there is substantial partial collapse. No pleural effusions.  Recently saw pulmonology whose recs are listed below for my reference:   # Mediastinal LAD # Bronchial wall thickening and bronchial stenosis # Multiple pulmonary nodules Concerned that he has active sarcoidosis and/or fibrosing mediastinitis. He may have superimposed atypical infection but my suspicion is relatively low for this.   Plan: - EBUS, bronch and BAL under general - consideration of steroid course after reviewing results - tentatively plan on clinic visit in January with PFTs   12/22 Patient is here today primarily for discussion.  He has not started the Advair was prescribed to him by pulmonology.  He is concerned about becoming "addicted" to the steroids.  He is also concerned about the medicine possibly raising his blood pressure.  He continues to have wheezing and a cough that is productive of  yellowish sputum.  He feels that he has allergies and asthma rather than sarcoidosis.  He believes that his allergies and dust exposure are why he has had so many frequent exacerbations recently.  At that time, my plan was: I spent approximately 30 minutes today with the patient primarily just talking.  I explained about sarcoidosis and whether it is active or in remission.  I am concerned that his sarcoidosis is now active and that this is why he had frequent "attacks" recently.  I recommended that he start the Advair immediately.  I explained the principal of inhaled corticosteroids.  I do not feel that he will become "addicted" to the steroids especially given the remote dose.  We did discuss side effects such as thrush and potential increased risk of pneumonia.  We also discussed the calcification seen in his coronary arteries.  He does have shortness of breath but I attribute this to the sarcoidosis.  He denies any chest pain or angina.  We discussed whether he would benefit from a stress test however we both decided to put this off until after the work-up has been completed regarding the biopsy of the lymph nodes in January.  12/30/21 When I last saw the patient there was question about sarcoidosis.  Biopsy was inconclusive.  At the present time pulmonology is treated the patient with Advair.  He states that as long as he uses the Advair his breathing is doing well.  He denies any significant wheezing or coughing or shortness of breath.  He states however if he does not take the Advair, he certainly has dyspnea on exertion.  His blood pressure today is well controlled at 118/72.  He is due to recheck his cholesterol.  He  also has some questions regarding treatment for Peyronie's disease.  We discussed this at length.  He is seeing a urologist to discuss surgical options and he wanted my opinion on this. Past Medical History:  Diagnosis Date   Allergy    Arthritis    left hip   Asthma    History of  kidney stones    Hyperlipidemia    Hypertension 2012   no bp meds since 2012   Multiple lung nodules on CT 2007   no current follow up done   Psoriasis    both legs   Urticaria    Urticaria 03/31/2018   Past Surgical History:  Procedure Laterality Date   BRONCHIAL WASHINGS  05/13/2021   Procedure: BRONCHIAL WASHINGS;  Surgeon: Maryjane Hurter, MD;  Location: [facility];  Service: Pulmonary;;   CHOLECYSTECTOMY  2007   ENDOBRONCHIAL ULTRASOUND N/A 05/13/2021   Procedure: ENDOBRONCHIAL ULTRASOUND;  Surgeon: Maryjane Hurter, MD;  Location: [facility];  Service: Pulmonary;  Laterality: N/A;   EUS N/A 06/28/2014   Procedure: LOWER ENDOSCOPIC ULTRASOUND (EUS);  Surgeon: Milus Banister, MD;  Location: [facility];  Service: Endoscopy;  Laterality: N/A;   EXTRACORPOREAL SHOCK WAVE LITHOTRIPSY N/A 03/21/2018   Procedure: EXTRACORPOREAL SHOCK WAVE LITHOTRIPSY (ESWL);  Surgeon: Irine Seal, MD;  Location: [facility];  Service: Urology;  Laterality: N/A;   FINE NEEDLE ASPIRATION  05/13/2021   Procedure: FINE NEEDLE ASPIRATION (FNA) LINEAR;  Surgeon: Maryjane Hurter, MD;  Location: [facility];  Service: Pulmonary;;   HIP ARTHROPLASTY Left 2018   MOUTH SURGERY  1972   PROCTOSCOPY N/A 08/27/2014   Procedure: PROCTOSCOPY;  Surgeon: Leighton Ruff, MD;  Location: [facility];  Service: General;  Laterality: N/A;   TRANSANAL RECTAL RESECTION N/A 08/27/2014   Procedure: TRANSANAL RESECTION OF RECTAL TUMOR;  Surgeon: Leighton Ruff, MD;  Location: [facility];  Service: General;  Laterality: N/A;   VIDEO BRONCHOSCOPY N/A 05/13/2021   Procedure: VIDEO BRONCHOSCOPY WITHOUT FLUORO;  Surgeon: Maryjane Hurter, MD;  Location: [facility];  Service: Pulmonary;  Laterality: N/A;   Current Outpatient Medications on File Prior to Visit  Medication Sig Dispense Refill   albuterol (VENTOLIN HFA) 108 (90 Base) MCG/ACT inhaler Inhale 2 puffs into the lungs every 6 (six) hours as needed for wheezing or shortness of breath.  8 g 0   aspirin EC 81 MG tablet Take 81 mg by mouth in the morning. Swallow whole.     doxycycline (VIBRA-TABS) 100 MG tablet Take 1 tablet (100 mg total) by mouth 2 (two) times daily. 14 tablet 0   EPINEPHrine 0.3 mg/0.3 mL IJ SOAJ injection Inject 0.3 mg into the muscle as needed for anaphylaxis. 2 each 1   fexofenadine (ALLEGRA) 180 MG tablet Take 180 mg by mouth daily. [place] Signature Aller-Fex Antihistamine 180 mg     fluticasone (FLONASE) 50 MCG/ACT nasal spray Use 2 sprays in each  nostril daily (Patient taking differently: 2 sprays every evening. Use 2 sprays in each  nostril daily) 48 g 3   fluticasone-salmeterol (ADVAIR HFA) 115-21 MCG/ACT inhaler Inhale 2 puffs into the lungs 2 (two) times daily. 1 each 12   Homeopathic Products (COLD REMEDY PO) Take 2 tablets by mouth every 2 (two) hours as needed (cold symptoms).     Multiple Vitamin (MULTIVITAMIN) tablet Take 1 tablet by mouth daily.     rosuvastatin (CRESTOR) 10 MG tablet Take 1 tablet (10 mg total) by mouth daily. 30 tablet 0   tamsulosin (FLOMAX)  0.4 MG CAPS capsule Take 0.4 mg by mouth daily.     No current facility-administered medications on file prior to visit.   Allergies  Allergen Reactions   Meat [Alpha-Gal] Anaphylaxis   Nylon Rash   Lotrel [Amlodipine Besy-Benazepril Hcl] Hives and Swelling   Other Swelling    lycra    Chalk Other (See Comments)    sneezing   Social History   Socioeconomic History   Marital status: Married    Spouse name: Not on file   Number of children: Not on file   Years of education: Not on file   Highest education level: Not on file  Occupational History   Not on file  Tobacco Use   Smoking status: Never   Smokeless tobacco: Never  Vaping Use   Vaping Use: Never used  Substance and Sexual Activity   Alcohol use: No   Drug use: No   Sexual activity: Not on file  Other Topics Concern   Not on file  Social History Narrative   Not on file   Social Determinants of Health    Financial Resource Strain: Low Risk  (08/07/2021)   Overall Financial Resource Strain (CARDIA)    Difficulty of Paying Living Expenses: Not hard at all  Food Insecurity: No Food Insecurity (08/07/2021)   Hunger Vital Sign    Worried About Running Out of Food in the Last Year: Never true    Ran Out of Food in the Last Year: Never true  Transportation Needs: No Transportation Needs (08/07/2021)   PRAPARE - Hydrologist (Medical): No    Lack of Transportation (Non-Medical): No  Physical Activity: Insufficiently Active (08/07/2021)   Exercise Vital Sign    Days of Exercise per Week: 3 days    Minutes of Exercise per Session: 30 min  Stress: No Stress Concern Present (08/07/2021)   [place]    Feeling of Stress : Not at all  Social Connections: [place] (08/07/2021)   Social Connection and Isolation Panel [NHANES]    Frequency of Communication with Friends and Family: More than three times a week    Frequency of Social Gatherings with Friends and Family: More than three times a week    Attends Religious Services: More than 4 times per year    Active Member of [employer] or Organizations: Yes    Attends Music therapist: More than 4 times per year    Marital Status: Married  Human resources officer Violence: Not At Risk (08/07/2021)   Humiliation, Afraid, Rape, and Kick questionnaire    Fear of Current or Ex-Partner: No    Emotionally Abused: No    Physically Abused: No    Sexually Abused: No   Family History  Problem Relation Age of Onset   Thyroid cancer Mother    Lung cancer Father    Colon cancer Neg Hx       Review of Systems  All other systems reviewed and are negative.      Objective:   Physical Exam Vitals reviewed.  Constitutional:      General: He is not in acute distress.    Appearance: He is well-developed. He is not diaphoretic.  HENT:     Head:  Normocephalic and atraumatic.     Right Ear: External ear normal.     Left Ear: External ear normal.     Nose: Rhinorrhea present.     Mouth/Throat:  Pharynx: No oropharyngeal exudate.  Eyes:     General: No scleral icterus.       Right eye: No discharge.        Left eye: No discharge.     Conjunctiva/sclera: Conjunctivae normal.     Pupils: Pupils are equal, round, and reactive to light.  Neck:     Thyroid: No thyromegaly.     Vascular: No JVD.     Trachea: No tracheal deviation.  Cardiovascular:     Rate and Rhythm: Normal rate and regular rhythm.     Heart sounds: Normal heart sounds. No murmur heard.    No friction rub. No gallop.  Pulmonary:     Effort: Pulmonary effort is normal. No respiratory distress.     Breath sounds: No stridor. Wheezing present. No rales.  Chest:     Chest wall: No tenderness.  Abdominal:     General: Bowel sounds are normal. There is no distension.     Palpations: Abdomen is soft. There is no mass.     Tenderness: There is no abdominal tenderness. There is no guarding or rebound.  Musculoskeletal:        General: No tenderness or deformity. Normal range of motion.     Cervical back: Normal range of motion and neck supple.  Lymphadenopathy:     Cervical: No cervical adenopathy.  Skin:    General: Skin is warm.     Coloration: Skin is not pale.     Findings: No erythema or rash.  Neurological:     Mental Status: He is alert and oriented to person, place, and time.     Cranial Nerves: No cranial nerve deficit.     Motor: No abnormal muscle tone.     Coordination: Coordination normal.     Deep Tendon Reflexes: Reflexes are normal and symmetric.  Psychiatric:        Behavior: Behavior normal.        Thought Content: Thought content normal.        Judgment: Judgment normal.           Assessment & Plan:  Pure hypercholesterolemia - Plan: CBC with Differential/Platelet, Lipid panel, COMPLETE METABOLIC PANEL WITH GFR  Asthma, moderate  persistent, well-controlled Patient's blood pressure is outstanding.  Presently his wheezing and shortness of breath is well controlled on Advair.  The biopsy was inconclusive.  At the present time, pulmonology is monitoring the patient clinically.  Possible causes include sarcoidosis versus interstitial lung disease.  The current plan is to try corticosteroids if the patient notices worsening in his lung function and shortness of breath however at the present time the patient has been stable for the last several months.  I will check his cholesterol today while he is here.  Ideally I like his LDL cholesterol below 100.  Also answered his questions pertaining to Perrone's disease as best I could.

## 2021-12-31 LAB — COMPLETE METABOLIC PANEL WITH GFR
AG Ratio: 1.6 (calc) (ref 1.0–2.5)
ALT: 16 U/L (ref 9–46)
AST: 19 U/L (ref 10–35)
Albumin: 3.9 g/dL (ref 3.6–5.1)
Alkaline phosphatase (APISO): 79 U/L (ref 35–144)
BUN: 13 mg/dL (ref 7–25)
CO2: 28 mmol/L (ref 20–32)
Calcium: 9.2 mg/dL (ref 8.6–10.3)
Chloride: 103 mmol/L (ref 98–110)
Creat: 0.96 mg/dL (ref 0.70–1.35)
Globulin: 2.4 g/dL (calc) (ref 1.9–3.7)
Glucose, Bld: 89 mg/dL (ref 65–99)
Potassium: 4.5 mmol/L (ref 3.5–5.3)
Sodium: 139 mmol/L (ref 135–146)
Total Bilirubin: 0.6 mg/dL (ref 0.2–1.2)
Total Protein: 6.3 g/dL (ref 6.1–8.1)
eGFR: 86 mL/min/{1.73_m2} (ref >=60)

## 2021-12-31 LAB — CBC WITH DIFFERENTIAL/PLATELET
Absolute Monocytes: 717 cells/uL (ref 200–950)
Basophils Absolute: 47 cells/uL (ref 0–200)
Basophils Relative: 0.7 %
Eosinophils Absolute: 590 cells/uL — ABNORMAL HIGH (ref 15–500)
Eosinophils Relative: 8.8 %
HCT: 43.1 % (ref 38.5–50.0)
Hemoglobin: 14.6 g/dL (ref 13.2–17.1)
Lymphs Abs: 891 cells/uL (ref 850–3900)
MCH: 31.5 pg (ref 27.0–33.0)
MCHC: 33.9 g/dL (ref 32.0–36.0)
MCV: 92.9 fL (ref 80.0–100.0)
MPV: 10.8 fL (ref 7.5–12.5)
Monocytes Relative: 10.7 %
Neutro Abs: 4456 cells/uL (ref 1500–7800)
Neutrophils Relative %: 66.5 %
Platelets: 244 10*3/uL (ref 140–400)
RBC: 4.64 10*6/uL (ref 4.20–5.80)
RDW: 12.1 % (ref 11.0–15.0)
Total Lymphocyte: 13.3 %
WBC: 6.7 10*3/uL (ref 3.8–10.8)

## 2021-12-31 LAB — LIPID PANEL
Cholesterol: 119 mg/dL (ref <200)
HDL: 36 mg/dL — ABNORMAL LOW (ref >=40)
LDL Cholesterol (Calc): 63 mg/dL (calc)
Non-HDL Cholesterol (Calc): 83 mg/dL (calc) (ref <130)
Total CHOL/HDL Ratio: 3.3 (calc) (ref <5.0)
Triglycerides: 117 mg/dL (ref <150)

## 2022-01-06 DIAGNOSIS — Z85828 Personal history of other malignant neoplasm of skin: Secondary | ICD-10-CM | POA: Diagnosis not present

## 2022-01-06 DIAGNOSIS — L812 Freckles: Secondary | ICD-10-CM | POA: Diagnosis not present

## 2022-01-06 DIAGNOSIS — L821 Other seborrheic keratosis: Secondary | ICD-10-CM | POA: Diagnosis not present

## 2022-01-06 DIAGNOSIS — D225 Melanocytic nevi of trunk: Secondary | ICD-10-CM | POA: Diagnosis not present

## 2022-01-19 DIAGNOSIS — H0102A Squamous blepharitis right eye, upper and lower eyelids: Secondary | ICD-10-CM | POA: Diagnosis not present

## 2022-01-19 DIAGNOSIS — H1045 Other chronic allergic conjunctivitis: Secondary | ICD-10-CM | POA: Diagnosis not present

## 2022-01-19 DIAGNOSIS — H04123 Dry eye syndrome of bilateral lacrimal glands: Secondary | ICD-10-CM | POA: Diagnosis not present

## 2022-01-19 DIAGNOSIS — H0102B Squamous blepharitis left eye, upper and lower eyelids: Secondary | ICD-10-CM | POA: Diagnosis not present

## 2022-02-02 ENCOUNTER — Other Ambulatory Visit: Payer: Self-pay | Admitting: Family Medicine

## 2022-03-23 ENCOUNTER — Other Ambulatory Visit: Payer: Self-pay | Admitting: Family Medicine

## 2022-04-27 ENCOUNTER — Other Ambulatory Visit: Payer: Self-pay

## 2022-04-27 DIAGNOSIS — E785 Hyperlipidemia, unspecified: Secondary | ICD-10-CM

## 2022-04-27 MED ORDER — ROSUVASTATIN CALCIUM 10 MG PO TABS: 10.0000 mg | ORAL_TABLET | Freq: Every day | ORAL | 3 refills | Status: DC

## 2022-05-12 DIAGNOSIS — J0100 Acute maxillary sinusitis, unspecified: Secondary | ICD-10-CM | POA: Diagnosis not present

## 2022-06-16 DIAGNOSIS — K080 Exfoliation of teeth due to systemic causes: Secondary | ICD-10-CM | POA: Diagnosis not present

## 2022-06-20 NOTE — Progress Notes (Unsigned)
Synopsis: Referred for sarcoidosis by Susy Frizzle, MD  Subjective:   PATIENT ID: Thomas Livingston GENDER: male DOB: 10/08/1950, MRN: [id number]  No chief complaint on file.  72yM with history of sarcoidosis, asthma, alpha gal allergy followed by Dr. Neldon Mc, allergy, pulmonary nodules, HTN who is referred for recurring lung infection wondering if he has pneumonia, frequently coughing up phlegm, plugs.   He thinks he picked up a cold again from a sick contact. Has had 2 courses of steroids/ABX with some improvement after each course but not back to baseline. On third round of ABX currently taking augmentin and azithromycin. Hasn't yet tried spiriva inhaler.   Was diagnosed with sarcoid in 2007 based on hilar adenopathy but never had biopsy. 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 He never was treated for sarcoid, never was symptomatic.  Has a brother that died of pulmonary fibrosis that he attributes to agent orange exposure. Wife has sarcoid.  Did work for [employer] and didn't wear mask when working. Never smoked. Has never lived outside of [place]. No pets at home. He hunts and dresses deer.  Interval HPI:  Continued on advair 2 puffs BID rinsing mouth afterward. Stable HRCT Chest.  Says he overall feels pretty good relative to last visit. He has no bothersome cough at present does have some postnasal drip.   No chest pain, vision changes.  ---------------------------------------------------- Arlyce Harman today  Otherwise pertinent review of systems is negative.   Past Medical History:  Diagnosis Date   Allergy    Arthritis    left hip   Asthma    History of kidney stones    Hyperlipidemia    Hypertension 2012   no bp meds since 2012   Multiple lung nodules on CT 2007   no current follow up done   Psoriasis    both legs   Urticaria    Urticaria 03/31/2018     Family History  Problem Relation Age of  Onset   Thyroid cancer Mother    Lung cancer Father    Colon cancer Neg Hx      Past Surgical History:  Procedure Laterality Date   BRONCHIAL WASHINGS  05/13/2021   Procedure: BRONCHIAL WASHINGS;  Surgeon: Maryjane Hurter, MD;  Location: [facility];  Service: Pulmonary;;   CHOLECYSTECTOMY  2007   ENDOBRONCHIAL ULTRASOUND N/A 05/13/2021   Procedure: ENDOBRONCHIAL ULTRASOUND;  Surgeon: Maryjane Hurter, MD;  Location: [facility];  Service: Pulmonary;  Laterality: N/A;   EUS N/A 06/28/2014   Procedure: LOWER ENDOSCOPIC ULTRASOUND (EUS);  Surgeon: Milus Banister, MD;  Location: [facility];  Service: Endoscopy;  Laterality: N/A;   EXTRACORPOREAL SHOCK WAVE LITHOTRIPSY N/A 03/21/2018   Procedure: EXTRACORPOREAL SHOCK WAVE LITHOTRIPSY (ESWL);  Surgeon: Irine Seal, MD;  Location: [facility];  Service: Urology;  Laterality: N/A;   FINE NEEDLE ASPIRATION  05/13/2021   Procedure: FINE NEEDLE ASPIRATION (FNA) LINEAR;  Surgeon: Maryjane Hurter, MD;  Location: [facility];  Service: Pulmonary;;   HIP ARTHROPLASTY Left 2018   MOUTH SURGERY  1972   PROCTOSCOPY N/A 08/27/2014   Procedure: PROCTOSCOPY;  Surgeon: Leighton Ruff, MD;  Location: [facility];  Service: General;  Laterality: N/A;   TRANSANAL RECTAL RESECTION N/A 08/27/2014   Procedure: TRANSANAL RESECTION OF RECTAL TUMOR;  Surgeon: Leighton Ruff, MD;  Location: [facility];  Service: General;  Laterality: N/A;   VIDEO BRONCHOSCOPY N/A 05/13/2021  Procedure: VIDEO BRONCHOSCOPY WITHOUT FLUORO;  Surgeon: Maryjane Hurter, MD;  Location: [facility];  Service: Pulmonary;  Laterality: N/A;    Social History   Socioeconomic History   Marital status: Married    Spouse name: Not on file   Number of children: Not on file   Years of education: Not on file   Highest education level: Not on file  Occupational History   Not on file  Tobacco Use   Smoking status: Never   Smokeless tobacco: Never  Vaping Use   Vaping Use: Never used  Substance and  Sexual Activity   Alcohol use: No   Drug use: No   Sexual activity: Not on file  Other Topics Concern   Not on file  Social History Narrative   Not on file   Social Determinants of Health   Financial Resource Strain: Low Risk  (08/07/2021)   Overall Financial Resource Strain (CARDIA)    Difficulty of Paying Living Expenses: Not hard at all  Food Insecurity: No Food Insecurity (08/07/2021)   Hunger Vital Sign    Worried About Running Out of Food in the Last Year: Never true    [place] in the Last Year: Never true  Transportation Needs: No Transportation Needs (08/07/2021)   PRAPARE - Hydrologist (Medical): No    Lack of Transportation (Non-Medical): No  Physical Activity: Insufficiently Active (08/07/2021)   Exercise Vital Sign    Days of Exercise per Week: 3 days    Minutes of Exercise per Session: 30 min  Stress: No Stress Concern Present (08/07/2021)   [place]    Feeling of Stress : Not at all  Social Connections: [place] (08/07/2021)   Social Connection and Isolation Panel [NHANES]    Frequency of Communication with Friends and Family: More than three times a week    Frequency of Social Gatherings with Friends and Family: More than three times a week    Attends Religious Services: More than 4 times per year    Active Member of [employer] or Organizations: Yes    Attends Music therapist: More than 4 times per year    Marital Status: Married  Human resources officer Violence: Not At Risk (08/07/2021)   Humiliation, Afraid, Rape, and Kick questionnaire    Fear of Current or Ex-Partner: No    Emotionally Abused: No    Physically Abused: No    Sexually Abused: No     Allergies  Allergen Reactions   Meat [Alpha-Gal] Anaphylaxis   Nylon Rash   Lotrel [Amlodipine Besy-Benazepril Hcl] Hives and Swelling   Other Swelling    lycra    [place]alk Other (See Comments)     sneezing     Outpatient Medications Prior to Visit  Medication Sig Dispense Refill   albuterol (VENTOLIN HFA) 108 (90 Base) MCG/ACT inhaler Inhale 2 puffs into the lungs every 6 (six) hours as needed for wheezing or shortness of breath. (Patient not taking: Reported on 12/30/2021) 8 g 0   aspirin EC 81 MG tablet Take 81 mg by mouth in the morning. Swallow whole.     doxycycline (VIBRA-TABS) 100 MG tablet Take 1 tablet (100 mg total) by mouth 2 (two) times daily. (Patient not taking: Reported on 12/30/2021) 14 tablet 0   EPINEPHrine 0.3 mg/0.3 mL IJ SOAJ injection Inject 0.3 mg into the muscle as needed for anaphylaxis. 2 each 1   fexofenadine (  ALLEGRA) 180 MG tablet Take 180 mg by mouth daily. [place] Signature Aller-Fex Antihistamine 180 mg     fluticasone (FLONASE) 50 MCG/ACT nasal spray Use 2 sprays in each  nostril daily (Patient taking differently: 2 sprays every evening. Use 2 sprays in each  nostril daily) 48 g 3   fluticasone-salmeterol (ADVAIR HFA) 115-21 MCG/ACT inhaler Inhale 2 puffs into the lungs 2 (two) times daily. 1 each 12   Homeopathic Products (COLD REMEDY PO) Take 2 tablets by mouth every 2 (two) hours as needed (cold symptoms). (Patient not taking: Reported on 12/30/2021)     Multiple Vitamin (MULTIVITAMIN) tablet Take 1 tablet by mouth daily.     rosuvastatin (CRESTOR) 10 MG tablet Take 1 tablet (10 mg total) by mouth daily. 90 tablet 3   tamsulosin (FLOMAX) 0.4 MG CAPS capsule Take 0.4 mg by mouth daily.     No facility-administered medications prior to visit.       Objective:   Physical Exam:  General appearance: 72 y.o., male, NAD, conversant  Eyes: anicteric sclerae; PERRL, tracking appropriately HENT: NCAT; MMM Neck: Trachea midline; no lymphadenopathy, no JVD Lungs: Good air movement, with normal respiratory effort CV: RRR, no murmur  Abdomen: Soft, non-tender; non-distended, BS present  Extremities: No peripheral edema, warm Skin: Normal turgor and  texture; no rash Psych: Appropriate affect Neuro: Alert and oriented to person and place, no focal deficit     There were no vitals filed for this visit.      on RA BMI Readings from Last 3 Encounters:  12/30/21 30.19 kg/m  10/15/21 29.91 kg/m  08/07/21 29.15 kg/m   Wt Readings from Last 3 Encounters:  12/30/21 228 lb 12.8 oz (103.8 kg)  10/15/21 229 lb 12.8 oz (104.2 kg)  08/07/21 227 lb (103 kg)     CBC    Component Value Date/Time   WBC 6.7 12/30/2021 1243   RBC 4.64 12/30/2021 1243   HGB 14.6 12/30/2021 1243   HGB 15.5 09/01/2017 1048   HCT 43.1 12/30/2021 1243   HCT 45.6 09/01/2017 1048   PLT 244 12/30/2021 1243   PLT 252 09/01/2017 1048   MCV 92.9 12/30/2021 1243   MCV 91 09/01/2017 1048   MCH 31.5 12/30/2021 1243   MCHC 33.9 12/30/2021 1243   RDW 12.1 12/30/2021 1243   RDW 13.9 09/01/2017 1048   LYMPHSABS 891 12/30/2021 1243   LYMPHSABS 1.5 09/01/2017 1048   MONOABS 1,092 (H) 02/20/2016 1249   EOSABS 590 (H) 12/30/2021 1243   EOSABS 0.4 09/01/2017 1048   BASOSABS 47 12/30/2021 1243   BASOSABS 0.0 09/01/2017 1048    Chest Imaging: CT Chest 04/11/2021 reviewed by me remarkable for calcified mediastinal LAD, bronchovascular thickening and bronchial stenosis, pulmonary nodules. Airways disease has progressed significantly and nodes are now calcified to greater extent  HRCT Chest 09/30/21 reviewed by me stable  Pulmonary Functions Testing Results:    Latest Ref Rng & Units 10/15/2021    8:43 AM 07/02/2021    8:56 AM  PFT Results  FVC-Pre L 4.14  4.35   FVC-Predicted Pre % 82  86   FVC-Post L  4.52   FVC-Predicted Post %  90   Pre FEV1/FVC % % 66  64   Post FEV1/FCV % %  68   FEV1-Pre L 2.72  2.78   FEV1-Predicted Pre % 73  74   FEV1-Post L  3.09   DLCO uncorrected ml/min/mmHg  22.61   DLCO UNC% %  78  DLCO corrected ml/min/mmHg  22.61   DLCO COR %Predicted %  78   DLVA Predicted %  91   TLC L  6.59   TLC % Predicted %  86   RV % Predicted %   82    Spirometry reviewed by me today stable     Assessment & Plan:   # Mild persistent asthma # Mediastinal LAD # Bronchial wall thickening and bronchial stenosis # Multiple pulmonary nodules Concerned that he may have sarcoidosis and/or fibrosing mediastinitis. EBUS, BAL unrevealing for any particular etiology but these remain possibilities. PFTs today with reversible obstruction, essentially positive BD response.   Plan: - continue advair 2 puffs twice daily rinsing mouth after use - consideration of steroid course if progressive decline in lung function, radiographic, and/or symptomatic progression - spirometry next visit in January    Larrie Fraizer M Darcus Edds, MD [facility] 06/20/2022 4:08 PM

## 2022-06-22 ENCOUNTER — Other Ambulatory Visit: Payer: Self-pay

## 2022-06-22 ENCOUNTER — Ambulatory Visit: Payer: Medicare Other | Admitting: Student

## 2022-06-22 ENCOUNTER — Ambulatory Visit (INDEPENDENT_AMBULATORY_CARE_PROVIDER_SITE_OTHER): Payer: Medicare Other | Admitting: Student

## 2022-06-22 ENCOUNTER — Encounter: Payer: Self-pay | Admitting: Student

## 2022-06-22 VITALS — BP 108/70 | HR 56 | Temp 97.9°F | Ht 74.0 in | Wt 236.6 lb

## 2022-06-22 DIAGNOSIS — J849 Interstitial pulmonary disease, unspecified: Secondary | ICD-10-CM | POA: Diagnosis not present

## 2022-06-22 DIAGNOSIS — J4530 Mild persistent asthma, uncomplicated: Secondary | ICD-10-CM | POA: Diagnosis not present

## 2022-06-22 LAB — PULMONARY FUNCTION TEST
FEF 25-75 Pre: 1.91 L/sec
FEF2575-%Pred-Pre: 65 %
FEV1-%Pred-Pre: 72 %
FEV1-Pre: 2.78 L
FEV1FVC-%Pred-Pre: 94 %
FEV6-%Pred-Pre: 81 %
FEV6-Pre: 3.96 L
FEV6FVC-%Pred-Pre: 104 %
FVC-%Pred-Pre: 77 %
FVC-Pre: 4 L
Pre FEV1/FVC ratio: 70 %
Pre FEV6/FVC Ratio: 99 %

## 2022-06-22 NOTE — Patient Instructions (Signed)
Spirometry Performed Today.

## 2022-06-22 NOTE — Patient Instructions (Addendum)
-   Keep using advair 2 puffs twice daily, rinse mouth after use - good idea to check in with cardiology at your convenience - appointment in 1 year with Dr. Loanne Drilling

## 2022-06-22 NOTE — Progress Notes (Signed)
Spirometry Performed Today.

## 2022-06-30 DIAGNOSIS — K080 Exfoliation of teeth due to systemic causes: Secondary | ICD-10-CM | POA: Diagnosis not present

## 2022-08-05 ENCOUNTER — Encounter: Payer: Self-pay | Admitting: Family Medicine

## 2022-08-13 ENCOUNTER — Ambulatory Visit (INDEPENDENT_AMBULATORY_CARE_PROVIDER_SITE_OTHER): Payer: Medicare Other

## 2022-08-13 ENCOUNTER — Telehealth: Payer: Self-pay

## 2022-08-13 VITALS — Ht 74.0 in | Wt 236.0 lb

## 2022-08-13 DIAGNOSIS — Z0000 Encounter for general adult medical examination without abnormal findings: Secondary | ICD-10-CM

## 2022-08-13 NOTE — Progress Notes (Signed)
Subjective:   Thomas Livingston is a 72 y.o. male who presents for Medicare Annual/Subsequent preventive examination.  I connected with  Thomas Livingston on 08/13/22 by a audio enabled telemedicine application and verified that I am speaking with the correct person using two identifiers.  Patient Location: Home  Provider Location: Office/Clinic  I discussed the limitations of evaluation and management by telemedicine. The patient expressed understanding and agreed to proceed.  Review of Systems     Cardiac Risk Factors include: advanced age (>8men, >49 women);hypertension;male gender;dyslipidemia     Objective:    Today's Vitals   08/13/22 0948  Weight: 236 lb (107 kg)  Height: 6\' 2"  (1.88 m)   Body mass index is 30.3 kg/m.     08/13/2022    9:51 AM 08/07/2021    9:52 AM 05/13/2021   10:56 AM 11/27/2020    9:51 PM 09/25/2020    4:40 AM 08/13/2020    6:01 PM 07/31/2020    8:33 AM  Advanced Directives  Does Patient Have a Medical Advance Directive? Yes Yes Yes No No No Yes  Type of Advance Directive Living will;Healthcare Power of [place];Living will [place];Living will      Does patient want to make changes to medical advance directive? No - Patient declined      No - Patient declined  Copy of [place] in Chart? No - copy requested No - copy requested No - copy requested      Would patient like information on creating a medical advance directive?  No - Patient declined   No - Patient declined      Current Medications (verified) Outpatient Encounter Medications as of 08/13/2022  Medication Sig   albuterol (VENTOLIN HFA) 108 (90 Base) MCG/ACT inhaler Inhale 2 puffs into the lungs every 6 (six) hours as needed for wheezing or shortness of breath.   aspirin EC 81 MG tablet Take 81 mg by mouth in the morning. Swallow whole.   EPINEPHrine 0.3 mg/0.3 mL IJ SOAJ injection Inject 0.3 mg into the muscle as needed for  anaphylaxis.   fexofenadine (ALLEGRA) 180 MG tablet Take 180 mg by mouth daily. [place] Signature Aller-Fex Antihistamine 180 mg   fluticasone (FLONASE) 50 MCG/ACT nasal spray Use 2 sprays in each  nostril daily (Patient taking differently: 2 sprays every evening. Use 2 sprays in each  nostril daily)   fluticasone-salmeterol (ADVAIR HFA) 115-21 MCG/ACT inhaler Inhale 2 puffs into the lungs 2 (two) times daily.   Homeopathic Products (COLD REMEDY PO) Take 2 tablets by mouth every 2 (two) hours as needed (cold symptoms).   Multiple Vitamin (MULTIVITAMIN) tablet Take 1 tablet by mouth daily.   rosuvastatin (CRESTOR) 10 MG tablet Take 1 tablet (10 mg total) by mouth daily.   tamsulosin (FLOMAX) 0.4 MG CAPS capsule Take 0.4 mg by mouth daily.   No facility-administered encounter medications on file as of 08/13/2022.    Allergies (verified) Meat [alpha-gal], Nylon, Lotrel [amlodipine besy-benazepril hcl], Other, and Chalk   History: Past Medical History:  Diagnosis Date   Allergy    Arthritis    left hip   Asthma    History of kidney stones    Hyperlipidemia    Hypertension 2012   no bp meds since 2012   Multiple lung nodules on CT 2007   no current follow up done   Psoriasis    both legs   Urticaria    Urticaria  03/31/2018   Past Surgical History:  Procedure Laterality Date   BRONCHIAL WASHINGS  05/13/2021   Procedure: BRONCHIAL WASHINGS;  Surgeon: Maryjane Hurter, MD;  Location: [facility];  Service: Pulmonary;;   CHOLECYSTECTOMY  2007   ENDOBRONCHIAL ULTRASOUND N/A 05/13/2021   Procedure: ENDOBRONCHIAL ULTRASOUND;  Surgeon: Maryjane Hurter, MD;  Location: [facility];  Service: Pulmonary;  Laterality: N/A;   EUS N/A 06/28/2014   Procedure: LOWER ENDOSCOPIC ULTRASOUND (EUS);  Surgeon: Milus Banister, MD;  Location: [facility];  Service: Endoscopy;  Laterality: N/A;   EXTRACORPOREAL SHOCK WAVE LITHOTRIPSY N/A 03/21/2018   Procedure: EXTRACORPOREAL SHOCK WAVE LITHOTRIPSY  (ESWL);  Surgeon: Irine Seal, MD;  Location: [facility];  Service: Urology;  Laterality: N/A;   FINE NEEDLE ASPIRATION  05/13/2021   Procedure: FINE NEEDLE ASPIRATION (FNA) LINEAR;  Surgeon: Maryjane Hurter, MD;  Location: [facility];  Service: Pulmonary;;   HIP ARTHROPLASTY Left 2018   MOUTH SURGERY  1972   PROCTOSCOPY N/A 08/27/2014   Procedure: PROCTOSCOPY;  Surgeon: Leighton Ruff, MD;  Location: [facility];  Service: General;  Laterality: N/A;   TRANSANAL RECTAL RESECTION N/A 08/27/2014   Procedure: TRANSANAL RESECTION OF RECTAL TUMOR;  Surgeon: Leighton Ruff, MD;  Location: [facility];  Service: General;  Laterality: N/A;   VIDEO BRONCHOSCOPY N/A 05/13/2021   Procedure: VIDEO BRONCHOSCOPY WITHOUT FLUORO;  Surgeon: Maryjane Hurter, MD;  Location: [facility];  Service: Pulmonary;  Laterality: N/A;   Family History  Problem Relation Age of Onset   Thyroid cancer Mother    Lung cancer Father    Colon cancer Neg Hx    Social History   Socioeconomic History   Marital status: Married    Spouse name: Not on file   Number of children: Not on file   Years of education: Not on file   Highest education level: Not on file  Occupational History   Not on file  Tobacco Use   Smoking status: Never   Smokeless tobacco: Never  Vaping Use   Vaping Use: Never used  Substance and Sexual Activity   Alcohol use: No   Drug use: No   Sexual activity: Not on file  Other Topics Concern   Not on file  Social History Narrative   Not on file   Social Determinants of Health   Financial Resource Strain: Low Risk  (08/13/2022)   Overall Financial Resource Strain (CARDIA)    Difficulty of Paying Living Expenses: Not hard at all  Food Insecurity: No Food Insecurity (08/13/2022)   Hunger Vital Sign    Worried About Running Out of Food in the Last Year: Never true    [place] in the Last Year: Never true  Transportation Needs: No Transportation Needs (08/13/2022)   PRAPARE - Radiographer, therapeutic (Medical): No    Lack of Transportation (Non-Medical): No  Physical Activity: Insufficiently Active (08/13/2022)   Exercise Vital Sign    Days of Exercise per Week: 3 days    Minutes of Exercise per Session: 40 min  Stress: No Stress Concern Present (08/13/2022)   [place]    Feeling of Stress : Not at all  Social Connections: [place] (08/13/2022)   Social Connection and Isolation Panel [NHANES]    Frequency of Communication with Friends and Family: More than three times a week    Frequency of Social Gatherings with Friends and Family: Three times a week  Attends Religious Services: More than 4 times per year    Active Member of Clubs or Organizations: Yes    Attends Music therapist: More than 4 times per year    Marital Status: Married    Tobacco Counseling Counseling given: Not Answered   Clinical Intake:  Pre-visit preparation completed: Yes  Pain : No/denies pain  Diabetes: No  How often do you need to have someone help you when you read instructions, pamphlets, or other written materials from your doctor or pharmacy?: 1 - Never  Diabetic?No   Interpreter Needed?: No  Information entered by :: Denman George LPN   Activities of Daily Living    08/13/2022    9:51 AM 08/10/2022    4:57 PM  In your present state of health, do you have any difficulty performing the following activities:  Hearing? 0 0  Vision? 0 0  Difficulty concentrating or making decisions? 0 0  Walking or climbing stairs? 0 0  Dressing or bathing? 0 0  Doing errands, shopping? 0 0  Preparing Food and eating ? N N  Using the Toilet? N N  In the past six months, have you accidently leaked urine? N N  Do you have problems with loss of bowel control? N N  Managing your Medications? N N  Managing your Finances? N N  Housekeeping or managing your Housekeeping? N N    Patient Care  Team: Susy Frizzle, MD as PCP - General (Family Medicine)  Indicate any recent Medical Services you may have received from other than [facility] providers in the past year (date may be approximate).     Assessment:   This is a routine wellness examination for [place].  Hearing/Vision screen Hearing Screening - Comments:: Hard of hearing; bilateral hearing aids   Vision Screening - Comments:: Wears rx glasses - up to date with routine eye exams with [facility]    Dietary issues and exercise activities discussed: Current Exercise Habits: Home exercise routine, Type of exercise: walking;stretching, Time (Minutes): 30, Frequency (Times/Week): 4, Weekly Exercise (Minutes/Week): 120, Intensity: Mild   Goals Addressed   None   Depression Screen    08/13/2022    9:50 AM 12/30/2021   12:14 PM 08/07/2021    9:50 AM 07/31/2020    8:29 AM 05/18/2019   10:11 AM 03/17/2018    8:30 AM 03/17/2018    8:22 AM  PHQ 2/9 Scores  PHQ - 2 Score 0 0 0 0 0 0 0    Fall Risk    08/13/2022    9:50 AM 08/10/2022    4:57 PM 08/07/2021    9:53 AM 07/31/2020    8:29 AM 05/18/2019   10:11 AM  Fall Risk   Falls in the past year? 0 0 0 0 0  Number falls in past yr: 0 0 0    Injury with Fall? 0 0 0    Risk for fall due to : No Fall Risks  No Fall Risks No Fall Risks   Follow up Falls prevention discussed;Education provided;Falls evaluation completed  Falls prevention discussed Falls evaluation completed Falls evaluation completed    FALL RISK PREVENTION PERTAINING TO THE HOME:  Any stairs in or around the home? No  If so, are there any without handrails? No  Home free of loose throw rugs in walkways, pet beds, electrical cords, etc? Yes  Adequate lighting in your home to reduce risk of falls? Yes   ASSISTIVE DEVICES UTILIZED TO PREVENT FALLS:  Life alert? No  Use of a cane, walker or w/c? No  Grab bars in the bathroom? Yes  Shower chair or bench in shower? No  Elevated toilet seat or a  handicapped toilet? Yes   TIMED UP AND GO:  Was the test performed? No . Telephonic visit   Cognitive Function:        08/13/2022    9:51 AM  6CIT Screen  What Year? 0 points  What month? 0 points  What time? 0 points  Count back from 20 0 points  Months in reverse 0 points  Repeat phrase 0 points  Total Score 0 points    Immunizations Immunization History  Administered Date(s) Administered   Fluad Quad(high Dose 65+) 05/18/2019   Influenza, High Dose Seasonal PF 03/01/2018   Influenza,inj,Quad PF,6+ Mos 02/27/2014, 06/25/2015, 02/20/2016   Influenza-Unspecified 03/02/2017   PFIZER(Purple Top)SARS-COV-2 Vaccination 07/14/2019, 08/09/2019   PNEUMOCOCCAL CONJUGATE-20 06/04/2021   Pneumococcal Conjugate-13 03/17/2018   Tdap 11/14/2009, 12/16/2013   Zoster, Live 02/27/2014    TDAP status: Up to date  Flu Vaccine status: Up to date  Pneumococcal vaccine status: Up to date  Covid-19 vaccine status: Information provided on how to obtain vaccines.   Qualifies for Shingles Vaccine? Yes   Zostavax completed No   Shingrix Completed?: No.    Education has been provided regarding the importance of this vaccine. Patient has been advised to call insurance company to determine out of pocket expense if they have not yet received this vaccine. Advised may also receive vaccine at local pharmacy or Health Dept. Verbalized acceptance and understanding.  Screening Tests Health Maintenance  Topic Date Due   COVID-19 Vaccine (3 - 2023-24 season) 01/09/2022   Zoster Vaccines- Shingrix (1 of 2) 08/13/2022 (Originally 11/17/2002)   INFLUENZA VACCINE  12/10/2022   Medicare Annual Wellness (AWV)  08/13/2023   DTaP/Tdap/Td (3 - Td or Tdap) 12/17/2023   COLONOSCOPY (Pts 45-64yrs Insurance coverage will need to be confirmed)  06/11/2024   Pneumonia Vaccine 36+ Years old  Completed   Hepatitis C Screening  Completed   HPV VACCINES  Aged Out    Health Maintenance  Health Maintenance Due   Topic Date Due   COVID-19 Vaccine (3 - 2023-24 season) 01/09/2022    Colorectal cancer screening: Type of screening: Colonoscopy. Completed 06/11/14. Repeat every 10 years  Lung Cancer Screening: (Low Dose CT Chest recommended if Age 62-80 years, 30 pack-year currently smoking OR have quit w/in 15years.) does not qualify.   Lung Cancer Screening Referral: n/a  Additional Screening:  Hepatitis C Screening: does qualify; Completed 06/25/15  Vision Screening: Recommended annual ophthalmology exams for early detection of glaucoma and other disorders of the eye. Is the patient up to date with their annual eye exam?  Yes  Who is the provider or what is the name of the office in which the patient attends annual eye exams? [facility] If pt is not established with a provider, would they like to be referred to a provider to establish care? No .   Dental Screening: Recommended annual dental exams for proper oral hygiene  Community Resource Referral / Chronic Care Management: CRR required this visit?  No   CCM required this visit?  No      Plan:     I have personally reviewed and noted the following in the patient's chart:   Medical and social history Use of alcohol, tobacco or illicit drugs  Current medications and supplements including opioid prescriptions. Patient is  not currently taking opioid prescriptions. Functional ability and status Nutritional status Physical activity Advanced directives List of other physicians Hospitalizations, surgeries, and ER visits in previous 12 months Vitals Screenings to include cognitive, depression, and falls Referrals and appointments  In addition, I have reviewed and discussed with patient certain preventive protocols, quality metrics, and best practice recommendations. A written personalized care plan for preventive services as well as general preventive health recommendations were provided to patient.     Vanetta Mulders, [place]   [id number]   Due to this being a virtual visit, the after visit summary with patients personalized plan was offered to patient via mail or my-chart.  Patient would like to access on my-chart/  Nurse Notes: See telephone note with patient concerns/questions

## 2022-08-13 NOTE — Patient Instructions (Signed)
[name] , Thank you for taking time to come for your Medicare Wellness Visit. I appreciate your ongoing commitment to your health goals. Please review the following plan we discussed and let me know if I can assist you in the future.   These are the goals we discussed:  Goals      Exercise 3x per week (30 min per time)     Increase exercise. Travel more.         This is a list of the screening recommended for you and due dates:  Health Maintenance  Topic Date Due   COVID-19 Vaccine (3 - 2023-24 season) 01/09/2022   Zoster (Shingles) Vaccine (1 of 2) 08/13/2022*   Flu Shot  12/10/2022   Medicare Annual Wellness Visit  08/13/2023   DTaP/Tdap/Td vaccine (3 - Td or Tdap) 12/17/2023   Colon Cancer Screening  06/11/2024   Pneumonia Vaccine  Completed   Hepatitis C Screening: USPSTF Recommendation to screen - Ages 35-79 yo.  Completed   HPV Vaccine  Aged Out  *Topic was postponed. The date shown is not the original due date.    Advanced directives: Please bring a copy of your health care power of attorney and living will to the office to be added to your chart at your convenience.   Conditions/risks identified: Aim for 30 minutes of exercise or brisk walking, 6-8 glasses of water, and 5 servings of fruits and vegetables each day.   Next appointment: Follow up in one year for your annual wellness visit.   We will be in touch with Dr. Samella Parr response with your current respiratory symptoms   Preventive Care 70 Years and Older, Male  Preventive care refers to lifestyle choices and visits with your health care provider that can promote health and wellness. What does preventive care include? A yearly physical exam. This is also called an annual well check. Dental exams once or twice a year. Routine eye exams. Ask your health care provider how often you should have your eyes checked. Personal lifestyle choices, including: Daily care of your teeth and gums. Regular physical  activity. Eating a healthy diet. Avoiding tobacco and drug use. Limiting alcohol use. Practicing safe sex. Taking low doses of aspirin every day. Taking vitamin and mineral supplements as recommended by your health care provider. What happens during an annual well check? The services and screenings done by your health care provider during your annual well check will depend on your age, overall health, lifestyle risk factors, and family history of disease. Counseling  Your health care provider may ask you questions about your: Alcohol use. Tobacco use. Drug use. Emotional well-being. Home and relationship well-being. Sexual activity. Eating habits. History of falls. Memory and ability to understand (cognition). Work and work Statistician. Screening  You may have the following tests or measurements: Height, weight, and BMI. Blood pressure. Lipid and cholesterol levels. These may be checked every 5 years, or more frequently if you are over 33 years old. Skin check. Lung cancer screening. You may have this screening every year starting at age 4 if you have a 30-pack-year history of smoking and currently smoke or have quit within the past 15 years. Fecal occult blood test (FOBT) of the stool. You may have this test every year starting at age 45. Flexible sigmoidoscopy or colonoscopy. You may have a sigmoidoscopy every 5 years or a colonoscopy every 10 years starting at age 34. Prostate cancer screening. Recommendations will vary depending on your family history and  other risks. Hepatitis C blood test. Hepatitis B blood test. Sexually transmitted disease (STD) testing. Diabetes screening. This is done by checking your blood sugar (glucose) after you have not eaten for a while (fasting). You may have this done every 1-3 years. Abdominal aortic aneurysm (AAA) screening. You may need this if you are a current or former smoker. Osteoporosis. You may be screened starting at age 46 if you are  at high risk. Talk with your health care provider about your test results, treatment options, and if necessary, the need for more tests. Vaccines  Your health care provider may recommend certain vaccines, such as: Influenza vaccine. This is recommended every year. Tetanus, diphtheria, and acellular pertussis (Tdap, Td) vaccine. You may need a Td booster every 10 years. Zoster vaccine. You may need this after age 36. Pneumococcal 13-valent conjugate (PCV13) vaccine. One dose is recommended after age 47. Pneumococcal polysaccharide (PPSV23) vaccine. One dose is recommended after age 70. Talk to your health care provider about which screenings and vaccines you need and how often you need them. This information is not intended to replace advice given to you by your health care provider. Make sure you discuss any questions you have with your health care provider. Document Released: 05/24/2015 Document Revised: 01/15/2016 Document Reviewed: 02/26/2015 Elsevier Interactive Patient Education  2017 [employer] Prevention in the Home Falls can cause injuries. They can happen to people of all ages. There are many things you can do to make your home safe and to help prevent falls. What can I do on the outside of my home? Regularly fix the edges of walkways and driveways and fix any cracks. Remove anything that might make you trip as you walk through a door, such as a raised step or threshold. Trim any bushes or trees on the path to your home. Use bright outdoor lighting. Clear any walking paths of anything that might make someone trip, such as rocks or tools. Regularly check to see if handrails are loose or broken. Make sure that both sides of any steps have handrails. Any raised decks and porches should have guardrails on the edges. Have any leaves, snow, or ice cleared regularly. Use sand or salt on walking paths during winter. Clean up any spills in your garage right away. This includes oil  or grease spills. What can I do in the bathroom? Use night lights. Install grab bars by the toilet and in the tub and shower. Do not use towel bars as grab bars. Use non-skid mats or decals in the tub or shower. If you need to sit down in the shower, use a plastic, non-slip stool. Keep the floor dry. Clean up any water that spills on the floor as soon as it happens. Remove soap buildup in the tub or shower regularly. Attach bath mats securely with double-sided non-slip rug tape. Do not have throw rugs and other things on the floor that can make you trip. What can I do in the bedroom? Use night lights. Make sure that you have a light by your bed that is easy to reach. Do not use any sheets or blankets that are too big for your bed. They should not hang down onto the floor. Have a firm chair that has side arms. You can use this for support while you get dressed. Do not have throw rugs and other things on the floor that can make you trip. What can I do in the kitchen? Clean up any spills right  away. Avoid walking on wet floors. Keep items that you use a lot in easy-to-reach places. If you need to reach something above you, use a strong step stool that has a grab bar. Keep electrical cords out of the way. Do not use floor polish or wax that makes floors slippery. If you must use wax, use non-skid floor wax. Do not have throw rugs and other things on the floor that can make you trip. What can I do with my stairs? Do not leave any items on the stairs. Make sure that there are handrails on both sides of the stairs and use them. Fix handrails that are broken or loose. Make sure that handrails are as long as the stairways. Check any carpeting to make sure that it is firmly attached to the stairs. Fix any carpet that is loose or worn. Avoid having throw rugs at the top or bottom of the stairs. If you do have throw rugs, attach them to the floor with carpet tape. Make sure that you have a light  switch at the top of the stairs and the bottom of the stairs. If you do not have them, ask someone to add them for you. What else can I do to help prevent falls? Wear shoes that: Do not have high heels. Have rubber bottoms. Are comfortable and fit you well. Are closed at the toe. Do not wear sandals. If you use a stepladder: Make sure that it is fully opened. Do not climb a closed stepladder. Make sure that both sides of the stepladder are locked into place. Ask someone to hold it for you, if possible. Clearly mark and make sure that you can see: Any grab bars or handrails. First and last steps. Where the edge of each step is. Use tools that help you move around (mobility aids) if they are needed. These include: Canes. Walkers. Scooters. Crutches. Turn on the lights when you go into a dark area. Replace any light bulbs as soon as they burn out. Set up your furniture so you have a clear path. Avoid moving your furniture around. If any of your floors are uneven, fix them. If there are any pets around you, be aware of where they are. Review your medicines with your doctor. Some medicines can make you feel dizzy. This can increase your chance of falling. Ask your doctor what other things that you can do to help prevent falls. This information is not intended to replace advice given to you by your health care provider. Make sure you discuss any questions you have with your health care provider. Document Released: 02/21/2009 Document Revised: 10/03/2015 Document Reviewed: 06/01/2014 Elsevier Interactive Patient Education  2017 [employer].

## 2022-08-13 NOTE — Telephone Encounter (Signed)
Patient seen for AWV and states that he has had respiratory symptoms x 3 days with a low grade fever, cough, and congestion.  Has not taken a Covid test.  Please advise.

## 2022-08-14 DIAGNOSIS — J40 Bronchitis, not specified as acute or chronic: Secondary | ICD-10-CM | POA: Diagnosis not present

## 2022-08-14 DIAGNOSIS — J0140 Acute pansinusitis, unspecified: Secondary | ICD-10-CM | POA: Diagnosis not present

## 2022-09-04 ENCOUNTER — Other Ambulatory Visit: Payer: Medicare Other

## 2022-09-04 DIAGNOSIS — Z125 Encounter for screening for malignant neoplasm of prostate: Secondary | ICD-10-CM | POA: Diagnosis not present

## 2022-09-04 DIAGNOSIS — E785 Hyperlipidemia, unspecified: Secondary | ICD-10-CM | POA: Diagnosis not present

## 2022-09-04 DIAGNOSIS — I10 Essential (primary) hypertension: Secondary | ICD-10-CM | POA: Diagnosis not present

## 2022-09-04 DIAGNOSIS — D869 Sarcoidosis, unspecified: Secondary | ICD-10-CM | POA: Diagnosis not present

## 2022-09-04 DIAGNOSIS — R590 Localized enlarged lymph nodes: Secondary | ICD-10-CM | POA: Diagnosis not present

## 2022-09-05 DIAGNOSIS — H66001 Acute suppurative otitis media without spontaneous rupture of ear drum, right ear: Secondary | ICD-10-CM | POA: Diagnosis not present

## 2022-09-08 LAB — LIPOPROTEIN A (LPA): Lipoprotein (a): 12 nmol/L (ref <75)

## 2022-09-10 ENCOUNTER — Ambulatory Visit (INDEPENDENT_AMBULATORY_CARE_PROVIDER_SITE_OTHER): Payer: Medicare Other | Admitting: Family Medicine

## 2022-09-10 ENCOUNTER — Encounter: Payer: Self-pay | Admitting: Family Medicine

## 2022-09-10 VITALS — BP 120/78 | HR 64 | Temp 98.2°F | Ht 74.0 in | Wt 230.6 lb

## 2022-09-10 DIAGNOSIS — E7800 Pure hypercholesterolemia, unspecified: Secondary | ICD-10-CM | POA: Diagnosis not present

## 2022-09-10 DIAGNOSIS — I2510 Atherosclerotic heart disease of native coronary artery without angina pectoris: Secondary | ICD-10-CM

## 2022-09-10 DIAGNOSIS — Z0001 Encounter for general adult medical examination with abnormal findings: Secondary | ICD-10-CM | POA: Diagnosis not present

## 2022-09-10 DIAGNOSIS — L608 Other nail disorders: Secondary | ICD-10-CM

## 2022-09-10 DIAGNOSIS — D869 Sarcoidosis, unspecified: Secondary | ICD-10-CM | POA: Diagnosis not present

## 2022-09-10 DIAGNOSIS — Z0000 Encounter for general adult medical examination without abnormal findings: Secondary | ICD-10-CM

## 2022-09-10 NOTE — Progress Notes (Signed)
Subjective:    Patient ID: Thomas Livingston, male    DOB: 1950/05/23, 72 y.o.   MRN: [id number]   Patient is a very pleasant 72 year old Caucasian male here today for complete physical exam.  Last colonoscopy was in 2016 and was within normal limits.  Next colonoscopy is due in 2026.  He is due for a PSA to screen for prostate cancer.  He does not smoke and therefore does not require lung cancer screening.  His immunizations are listed below. Immunization History  Administered Date(s) Administered   Fluad Quad(high Dose 65+) 05/18/2019   Influenza, High Dose Seasonal PF 03/01/2018   Influenza,inj,Quad PF,6+ Mos 02/27/2014, 06/25/2015, 02/20/2016   Influenza-Unspecified 03/02/2017   PFIZER(Purple Top)SARS-COV-2 Vaccination 07/14/2019, 08/09/2019   PNEUMOCOCCAL CONJUGATE-20 06/04/2021   Pneumococcal Conjugate-13 03/17/2018   Tdap 11/14/2009, 12/16/2013   Zoster, Live 02/27/2014  Patient recently had a CT scan of his chest to monitor his sarcoidosis and there was a coincidental finding of plaque in the coronary artery.  Otherwise he is asymptomatic.  He denies any chest pain or shortness of breath or dyspnea on exertion beyond his baseline.  I explained that we cannot quantify the extent of the plaque only that is present.  I would recommend a cardiology consultation if you are having any symptoms to suggest ischemia.  We discussed what those symptoms are and at the present time he feels like he is doing fine.  He is already on a statin and his blood pressure is well-controlled today 120/78.  He is due for Prevnar 23, COVID shot, and Shingrix.  We discussed all of these today and he declines any vaccinations today.  He denies any falls or depression or memory loss.  He would like to see podiatrist due to dysmorphic toenails.  Lab on 09/04/2022  Component Date Value Ref Range Status   Lipoprotein (a) 09/04/2022 12  <75 nmol/L Final   Comment: . Risk Category   Optimal        < 75 nmol/L    Moderate   75 - 125 nmol/L   High          > 125 nmol/L . Cardiovascular event risk category cut points (optimal, moderate, high) are based on Tsimika S. JACC 2017;69:692-711. .    WBC 09/04/2022 7.9  3.8 - 10.8 Thousand/uL Final   RBC 09/04/2022 4.59  4.20 - 5.80 Million/uL Final   Hemoglobin 09/04/2022 14.4  13.2 - 17.1 g/dL Final   HCT 40/98/1191 42.8  38.5 - 50.0 % Final   MCV 09/04/2022 93.2  80.0 - 100.0 fL Final   MCH 09/04/2022 31.4  27.0 - 33.0 pg Final   MCHC 09/04/2022 33.6  32.0 - 36.0 g/dL Final   RDW 47/82/9562 12.0  11.0 - 15.0 % Final   Platelets 09/04/2022 220  140 - 400 Thousand/uL Final   MPV 09/04/2022 10.9  7.5 - 12.5 fL Final   Neutro Abs 09/04/2022 5,988  1,500 - 7,800 cells/uL Final   Lymphs Abs 09/04/2022 703 (L)  850 - 3,900 cells/uL Final   Absolute Monocytes 09/04/2022 751  200 - 950 cells/uL Final   Eosinophils Absolute 09/04/2022 419  15 - 500 cells/uL Final   Basophils Absolute 09/04/2022 40  0 - 200 cells/uL Final   Neutrophils Relative % 09/04/2022 75.8  % Final   Total Lymphocyte 09/04/2022 8.9  % Final   Monocytes Relative 09/04/2022 9.5  % Final   Eosinophils Relative 09/04/2022 5.3  % Final  Basophils Relative 09/04/2022 0.5  % Final   Glucose, Bld 09/04/2022 95  65 - 99 mg/dL Final   Comment: .            Fasting reference interval .    BUN 09/04/2022 8  7 - 25 mg/dL Final   Creat 40/02/2724 0.85  0.70 - 1.35 mg/dL Final   eGFR 36/64/4034 94  > OR = 60 mL/min/1.105m2 Final   BUN/Creatinine Ratio 09/04/2022 SEE NOTE:  6 - 22 (calc) Final   Comment:    Not Reported: BUN and Creatinine are within    reference range. .    Sodium 09/04/2022 138  135 - 146 mmol/L Final   Potassium 09/04/2022 4.3  3.5 - 5.3 mmol/L Final   Chloride 09/04/2022 103  98 - 110 mmol/L Final   CO2 09/04/2022 29  20 - 32 mmol/L Final   Calcium 09/04/2022 8.8  8.6 - 10.3 mg/dL Final   Total Protein 74/25/9563 6.1  6.1 - 8.1 g/dL Final   Albumin 87/56/4332 3.8  3.6  - 5.1 g/dL Final   Globulin 95/18/8416 2.3  1.9 - 3.7 g/dL (calc) Final   AG Ratio 09/04/2022 1.7  1.0 - 2.5 (calc) Final   Total Bilirubin 09/04/2022 0.5  0.2 - 1.2 mg/dL Final   Alkaline phosphatase (APISO) 09/04/2022 77  35 - 144 U/L Final   AST 09/04/2022 19  10 - 35 U/L Final   ALT 09/04/2022 16  9 - 46 U/L Final   Cholesterol 09/04/2022 134  <200 mg/dL Final   HDL 60/63/0160 42  > OR = 40 mg/dL Final   Triglycerides 10/93/2355 88  <150 mg/dL Final   LDL Cholesterol (Calc) 09/04/2022 75  mg/dL (calc) Final   Comment: Reference range: <100 . Desirable range <100 mg/dL for primary prevention;   <70 mg/dL for patients with CHD or diabetic patients  with > or = 2 CHD risk factors. Marland Kitchen LDL-C is now calculated using the Martin-Hopkins  calculation, which is a validated novel method providing  better accuracy than the Friedewald equation in the  estimation of LDL-C.  Horald Pollen et al. Lenox Ahr. 7322;025(42): 2061-2068  (http://education.[facility].com/faq/FAQ164)    Total CHOL/HDL Ratio 09/04/2022 3.2  <7.0 (calc) Final   Non-HDL Cholesterol (Calc) 09/04/2022 92  <130 mg/dL (calc) Final   Comment: For patients with diabetes plus 1 major ASCVD risk  factor, treating to a non-HDL-C goal of <100 mg/dL  (LDL-C of <62 mg/dL) is considered a therapeutic  option.     Past Medical History:  Diagnosis Date   Allergy    Arthritis    left hip   Asthma    History of kidney stones    Hyperlipidemia    Hypertension 2012   no bp meds since 2012   Multiple lung nodules on CT 2007   no current follow up done   Psoriasis    both legs   Urticaria    Urticaria 03/31/2018   Past Surgical History:  Procedure Laterality Date   BRONCHIAL WASHINGS  05/13/2021   Procedure: BRONCHIAL WASHINGS;  Surgeon: Omar Person, MD;  Location: [facility];  Service: Pulmonary;;   CHOLECYSTECTOMY  2007   ENDOBRONCHIAL ULTRASOUND N/A 05/13/2021   Procedure: ENDOBRONCHIAL ULTRASOUND;  Surgeon: Omar Person, MD;  Location: [facility];  Service: Pulmonary;  Laterality: N/A;   EUS N/A 06/28/2014   Procedure: LOWER ENDOSCOPIC ULTRASOUND (EUS);  Surgeon: Rachael Fee, MD;  Location: [facility];  Service: Endoscopy;  Laterality:  N/A;   EXTRACORPOREAL SHOCK WAVE LITHOTRIPSY N/A 03/21/2018   Procedure: EXTRACORPOREAL SHOCK WAVE LITHOTRIPSY (ESWL);  Surgeon: Bjorn Pippin, MD;  Location: [facility];  Service: Urology;  Laterality: N/A;   FINE NEEDLE ASPIRATION  05/13/2021   Procedure: FINE NEEDLE ASPIRATION (FNA) LINEAR;  Surgeon: Omar Person, MD;  Location: [facility];  Service: Pulmonary;;   HIP ARTHROPLASTY Left 2018   MOUTH SURGERY  1972   PROCTOSCOPY N/A 08/27/2014   Procedure: PROCTOSCOPY;  Surgeon: Romie Levee, MD;  Location: [facility];  Service: General;  Laterality: N/A;   TRANSANAL RECTAL RESECTION N/A 08/27/2014   Procedure: TRANSANAL RESECTION OF RECTAL TUMOR;  Surgeon: Romie Levee, MD;  Location: [facility];  Service: General;  Laterality: N/A;   VIDEO BRONCHOSCOPY N/A 05/13/2021   Procedure: VIDEO BRONCHOSCOPY WITHOUT FLUORO;  Surgeon: Omar Person, MD;  Location: [facility];  Service: Pulmonary;  Laterality: N/A;   Current Outpatient Medications on File Prior to Visit  Medication Sig Dispense Refill   albuterol (VENTOLIN HFA) 108 (90 Base) MCG/ACT inhaler Inhale 2 puffs into the lungs every 6 (six) hours as needed for wheezing or shortness of breath. 8 g 0   aspirin EC 81 MG tablet Take 81 mg by mouth in the morning. Swallow whole.     EPINEPHrine 0.3 mg/0.3 mL IJ SOAJ injection Inject 0.3 mg into the muscle as needed for anaphylaxis. 2 each 1   fexofenadine (ALLEGRA) 180 MG tablet Take 180 mg by mouth daily. [place] Signature Aller-Fex Antihistamine 180 mg     fluticasone (FLONASE) 50 MCG/ACT nasal spray Use 2 sprays in each  nostril daily (Patient taking differently: 2 sprays every evening. Use 2 sprays in each  nostril daily) 48 g 3   fluticasone-salmeterol (ADVAIR  HFA) 115-21 MCG/ACT inhaler Inhale 2 puffs into the lungs 2 (two) times daily. 1 each 12   Homeopathic Products (COLD REMEDY PO) Take 2 tablets by mouth every 2 (two) hours as needed (cold symptoms).     Multiple Vitamin (MULTIVITAMIN) tablet Take 1 tablet by mouth daily.     rosuvastatin (CRESTOR) 10 MG tablet Take 1 tablet (10 mg total) by mouth daily. 90 tablet 3   tamsulosin (FLOMAX) 0.4 MG CAPS capsule Take 0.4 mg by mouth daily.     No current facility-administered medications on file prior to visit.   Allergies  Allergen Reactions   Meat [Alpha-Gal] Anaphylaxis   Nylon Rash   Lotrel [Amlodipine Besy-Benazepril Hcl] Hives and Swelling   Other Swelling    lycra    Chalk Other (See Comments)    sneezing   Social History   Socioeconomic History   Marital status: Married    Spouse name: Not on file   Number of children: Not on file   Years of education: Not on file   Highest education level: Not on file  Occupational History   Not on file  Tobacco Use   Smoking status: Never   Smokeless tobacco: Never  Vaping Use   Vaping Use: Never used  Substance and Sexual Activity   Alcohol use: No   Drug use: No   Sexual activity: Not on file  Other Topics Concern   Not on file  Social History Narrative   Not on file   Social Determinants of Health   Financial Resource Strain: Low Risk  (08/13/2022)   Overall Financial Resource Strain (CARDIA)    Difficulty of Paying Living Expenses: Not hard at all  Food Insecurity: No Food Insecurity (08/13/2022)  Hunger Vital Sign    Worried About Running Out of Food in the Last Year: Never true    Ran Out of Food in the Last Year: Never true  Transportation Needs: No Transportation Needs (08/13/2022)   PRAPARE - Administrator, Civil Service (Medical): No    Lack of Transportation (Non-Medical): No  Physical Activity: Insufficiently Active (08/13/2022)   Exercise Vital Sign    Days of Exercise per Week: 3 days    Minutes of  Exercise per Session: 40 min  Stress: No Stress Concern Present (08/13/2022)   [employer] of Occupational Health - Occupational Stress Questionnaire    Feeling of Stress : Not at all  Social Connections: Socially Integrated (08/13/2022)   Social Connection and Isolation Panel [NHANES]    Frequency of Communication with Friends and Family: More than three times a week    Frequency of Social Gatherings with Friends and Family: Three times a week    Attends Religious Services: More than 4 times per year    Active Member of Clubs or Organizations: Yes    Attends Banker Meetings: More than 4 times per year    Marital Status: Married  Catering manager Violence: Not At Risk (08/13/2022)   Humiliation, Afraid, Rape, and Kick questionnaire    Fear of Current or Ex-Partner: No    Emotionally Abused: No    Physically Abused: No    Sexually Abused: No   Family History  Problem Relation Age of Onset   Thyroid cancer Mother    Lung cancer Father    Colon cancer Neg Hx       Review of Systems  All other systems reviewed and are negative.      Objective:   Physical Exam Vitals reviewed.  Constitutional:      General: He is not in acute distress.    Appearance: He is well-developed. He is not diaphoretic.  HENT:     Head: Normocephalic and atraumatic.     Right Ear: External ear normal.     Left Ear: External ear normal.     Nose: Nose normal.     Mouth/Throat:     Pharynx: No oropharyngeal exudate.  Eyes:     General: No scleral icterus.       Right eye: No discharge.        Left eye: No discharge.     Conjunctiva/sclera: Conjunctivae normal.     Pupils: Pupils are equal, round, and reactive to light.  Neck:     Thyroid: No thyromegaly.     Vascular: No JVD.     Trachea: No tracheal deviation.  Cardiovascular:     Rate and Rhythm: Normal rate and regular rhythm.     Heart sounds: Normal heart sounds. No murmur heard.    No friction rub. No gallop.   Pulmonary:     Effort: Pulmonary effort is normal. No respiratory distress.     Breath sounds: Normal breath sounds. No stridor. No wheezing or rales.  Chest:     Chest wall: No tenderness.  Abdominal:     General: Bowel sounds are normal. There is no distension.     Palpations: Abdomen is soft. There is no mass.     Tenderness: There is no abdominal tenderness. There is no guarding or rebound.  Musculoskeletal:        General: No tenderness or deformity. Normal range of motion.     Cervical back: Normal range of motion and  neck supple.  Lymphadenopathy:     Cervical: No cervical adenopathy.  Skin:    General: Skin is warm.     Coloration: Skin is not pale.     Findings: No erythema or rash.  Neurological:     Mental Status: He is alert and oriented to person, place, and time.     Cranial Nerves: No cranial nerve deficit.     Motor: No abnormal muscle tone.     Coordination: Coordination normal.     Deep Tendon Reflexes: Reflexes are normal and symmetric.  Psychiatric:        Behavior: Behavior normal.        Thought Content: Thought content normal.        Judgment: Judgment normal.     Patient has acquired dysmorphic toenails on both feet      Assessment & Plan:  Encounter for Medicare annual wellness exam  Pure hypercholesterolemia  Sarcoidosis  Atherosclerosis of native coronary artery of native heart without angina pectoris  Acquired dysmorphic toenail - Plan: Ambulatory referral to Podiatry At the present time, the patient declines Pneumovax 23, Shingrix, and a COVID shot.  Colonoscopy is up-to-date.  I will check a PSA to screen for prostate cancer.  Blood pressure and cholesterol are outstanding.  He is asymptomatic so we will forego any cardiology consultation at the present time.  If he develops any shortness of breath or chest tightness I can certainly consult cardiology for additional testing.  Patient agrees and if he develops any symptoms he will let me  know.  Consult podiatry for dysmorphic toenails.

## 2022-09-11 LAB — TEST AUTHORIZATION

## 2022-09-11 LAB — CBC WITH DIFFERENTIAL/PLATELET
Absolute Monocytes: 751 cells/uL (ref 200–950)
Basophils Absolute: 40 cells/uL (ref 0–200)
Basophils Relative: 0.5 %
Eosinophils Absolute: 419 cells/uL (ref 15–500)
Eosinophils Relative: 5.3 %
HCT: 42.8 % (ref 38.5–50.0)
Hemoglobin: 14.4 g/dL (ref 13.2–17.1)
Lymphs Abs: 703 cells/uL — ABNORMAL LOW (ref 850–3900)
MCH: 31.4 pg (ref 27.0–33.0)
MCHC: 33.6 g/dL (ref 32.0–36.0)
MCV: 93.2 fL (ref 80.0–100.0)
MPV: 10.9 fL (ref 7.5–12.5)
Monocytes Relative: 9.5 %
Neutro Abs: 5988 cells/uL (ref 1500–7800)
Neutrophils Relative %: 75.8 %
Platelets: 220 10*3/uL (ref 140–400)
RBC: 4.59 10*6/uL (ref 4.20–5.80)
RDW: 12 % (ref 11.0–15.0)
Total Lymphocyte: 8.9 %
WBC: 7.9 10*3/uL (ref 3.8–10.8)

## 2022-09-11 LAB — COMPLETE METABOLIC PANEL WITH GFR
AG Ratio: 1.7 (calc) (ref 1.0–2.5)
ALT: 16 U/L (ref 9–46)
AST: 19 U/L (ref 10–35)
Albumin: 3.8 g/dL (ref 3.6–5.1)
Alkaline phosphatase (APISO): 77 U/L (ref 35–144)
BUN: 8 mg/dL (ref 7–25)
CO2: 29 mmol/L (ref 20–32)
Calcium: 8.8 mg/dL (ref 8.6–10.3)
Chloride: 103 mmol/L (ref 98–110)
Creat: 0.85 mg/dL (ref 0.70–1.35)
Globulin: 2.3 g/dL (calc) (ref 1.9–3.7)
Glucose, Bld: 95 mg/dL (ref 65–99)
Potassium: 4.3 mmol/L (ref 3.5–5.3)
Sodium: 138 mmol/L (ref 135–146)
Total Bilirubin: 0.5 mg/dL (ref 0.2–1.2)
Total Protein: 6.1 g/dL (ref 6.1–8.1)
eGFR: 94 mL/min/{1.73_m2} (ref >=60)

## 2022-09-11 LAB — LIPID PANEL
Cholesterol: 134 mg/dL (ref <200)
HDL: 42 mg/dL (ref >=40)
LDL Cholesterol (Calc): 75 mg/dL (calc)
Non-HDL Cholesterol (Calc): 92 mg/dL (calc) (ref <130)
Total CHOL/HDL Ratio: 3.2 (calc) (ref <5.0)
Triglycerides: 88 mg/dL (ref <150)

## 2022-09-11 LAB — PSA: PSA: 0.8 ng/mL (ref <=4.00)

## 2022-09-19 DIAGNOSIS — H66001 Acute suppurative otitis media without spontaneous rupture of ear drum, right ear: Secondary | ICD-10-CM | POA: Diagnosis not present

## 2022-09-19 DIAGNOSIS — H6993 Unspecified Eustachian tube disorder, bilateral: Secondary | ICD-10-CM | POA: Diagnosis not present

## 2022-09-22 ENCOUNTER — Ambulatory Visit: Payer: Medicare Other | Admitting: Family Medicine

## 2022-09-22 ENCOUNTER — Encounter: Payer: Self-pay | Admitting: Podiatry

## 2022-09-22 ENCOUNTER — Ambulatory Visit: Payer: Medicare Other | Admitting: Podiatry

## 2022-09-22 DIAGNOSIS — L603 Nail dystrophy: Secondary | ICD-10-CM | POA: Diagnosis not present

## 2022-09-22 NOTE — Progress Notes (Signed)
  Subjective:  Patient ID: Thomas Livingston, male    DOB: Oct 11, 1950,   MRN: [id number]  Chief Complaint  Patient presents with   Nail Problem    Bilateral chronic ingrown's  Right foot second toe discoloration     72 y.o. male presents for concern of bilateral great toenails and right second toenail. Relates years go he had bilateral hallux nails removed. Relates he wanted them checked out as parts of the nails are growing back. Also has concern for thickness and pain in his right second toenail . Denies any other pedal complaints. Denies n/v/f/c.   Past Medical History:  Diagnosis Date   Allergy    Arthritis    left hip   Asthma    History of kidney stones    Hyperlipidemia    Hypertension 2012   no bp meds since 2012   Multiple lung nodules on CT 2007   no current follow up done   Psoriasis    both legs   Urticaria    Urticaria 03/31/2018    Objective:  Physical Exam: Vascular: DP/PT pulses 2/4 bilateral. CFT <3 seconds. Normal hair growth on digits. No edema.  Skin. No lacerations or abrasions bilateral feet. Bilateral hallux nails avulsed with minimal remaining spicules noted. Right second digit is thickened dystrophic elongated and discolored with subungual debris.  Musculoskeletal: MMT 5/5 bilateral lower extremities in DF, PF, Inversion and Eversion. Deceased ROM in DF of ankle joint.  Neurological: Sensation intact to light touch.   Assessment:   1. Onychodystrophy      Plan:  Patient was evaluated and treated and all questions answered. Discussed dystrophic and ingrown toenails etiology and treatment options including procedure for removal vs conservative care.  Patient requesting debridement today and bilatearl hallux and right second digits debrided as courtesy today.  Discussed if continued pain or infection with toes than can consider procedure to remove on the second and repeat on the bilateral hallux toenails.  Return as needed.     Louann Sjogren,  DPM

## 2022-10-01 ENCOUNTER — Ambulatory Visit (INDEPENDENT_AMBULATORY_CARE_PROVIDER_SITE_OTHER): Payer: Medicare Other | Admitting: Family Medicine

## 2022-10-01 VITALS — BP 124/72 | HR 69 | Temp 99.0°F | Ht 74.0 in | Wt 230.0 lb

## 2022-10-01 DIAGNOSIS — J0111 Acute recurrent frontal sinusitis: Secondary | ICD-10-CM

## 2022-10-01 MED ORDER — LEVOFLOXACIN 500 MG PO TABS: 500.0000 mg | ORAL_TABLET | Freq: Every day | ORAL | 0 refills | Status: AC

## 2022-10-01 MED ORDER — PREDNISONE 20 MG PO TABS
ORAL_TABLET | ORAL | 0 refills | Status: DC
Start: 2022-10-01 — End: 2024-01-27

## 2022-10-01 NOTE — Progress Notes (Signed)
Subjective:    Patient ID: Thomas Livingston, male    DOB: [birth year], 72 y.o.   MRN: [id number] Patient been treated under the care of a sinus tract/infection to send patient.  He has received amoxicillin and most recently he has received Omnicef with steroids.  He continues to have hearing loss in his right ear.  He states he feels like his head is in a pocket.  He also reports pain and pressure in his right frontal sinus.  He reports postnasal drip and a dull constant headache.  On examination today he has a bulging right tympanic membrane but no erythema.  He has tenderness to percussion over his right frontal sinus as well as swollen nasal mucosa.  Lungs are clear Past Medical History:  Diagnosis Date   Allergy    Arthritis    left hip   Asthma    History of kidney stones    Hyperlipidemia    Hypertension 2012   no bp meds since 2012   Multiple lung nodules on CT 2007   no current follow up done   Psoriasis    both legs   Urticaria    Urticaria 03/31/2018   Past Surgical History:  Procedure Laterality Date   BRONCHIAL WASHINGS  05/13/2021   Procedure: BRONCHIAL WASHINGS;  Surgeon: Omar Person, MD;  Location: [facility];  Service: Pulmonary;;   CHOLECYSTECTOMY  2007   ENDOBRONCHIAL ULTRASOUND N/A 05/13/2021   Procedure: ENDOBRONCHIAL ULTRASOUND;  Surgeon: Omar Person, MD;  Location: [facility];  Service: Pulmonary;  Laterality: N/A;   EUS N/A 06/28/2014   Procedure: LOWER ENDOSCOPIC ULTRASOUND (EUS);  Surgeon: Rachael Fee, MD;  Location: [facility];  Service: Endoscopy;  Laterality: N/A;   EXTRACORPOREAL SHOCK WAVE LITHOTRIPSY N/A 03/21/2018   Procedure: EXTRACORPOREAL SHOCK WAVE LITHOTRIPSY (ESWL);  Surgeon: Bjorn Pippin, MD;  Location: [facility];  Service: Urology;  Laterality: N/A;   FINE NEEDLE ASPIRATION  05/13/2021   Procedure: FINE NEEDLE ASPIRATION (FNA) LINEAR;  Surgeon: Omar Person, MD;  Location: [facility];  Service: Pulmonary;;   HIP  ARTHROPLASTY Left 2018   MOUTH SURGERY  1972   PROCTOSCOPY N/A 08/27/2014   Procedure: PROCTOSCOPY;  Surgeon: Romie Levee, MD;  Location: [facility];  Service: General;  Laterality: N/A;   TRANSANAL RECTAL RESECTION N/A 08/27/2014   Procedure: TRANSANAL RESECTION OF RECTAL TUMOR;  Surgeon: Romie Levee, MD;  Location: [facility];  Service: General;  Laterality: N/A;   VIDEO BRONCHOSCOPY N/A 05/13/2021   Procedure: VIDEO BRONCHOSCOPY WITHOUT FLUORO;  Surgeon: Omar Person, MD;  Location: [facility];  Service: Pulmonary;  Laterality: N/A;   Current Outpatient Medications on File Prior to Visit  Medication Sig Dispense Refill   albuterol (VENTOLIN HFA) 108 (90 Base) MCG/ACT inhaler Inhale 2 puffs into the lungs every 6 (six) hours as needed for wheezing or shortness of breath. 8 g 0   aspirin EC 81 MG tablet Take 81 mg by mouth in the morning. Swallow whole.     EPINEPHrine 0.3 mg/0.3 mL IJ SOAJ injection Inject 0.3 mg into the muscle as needed for anaphylaxis. 2 each 1   fexofenadine (ALLEGRA) 180 MG tablet Take 180 mg by mouth daily. [place] Signature Aller-Fex Antihistamine 180 mg     fluticasone (FLONASE) 50 MCG/ACT nasal spray Use 2 sprays in each  nostril daily (Patient taking differently: 2 sprays every evening. Use 2 sprays in each  nostril daily) 48 g 3   fluticasone-salmeterol (ADVAIR HFA) 115-21  MCG/ACT inhaler Inhale 2 puffs into the lungs 2 (two) times daily. 1 each 12   Homeopathic Products (COLD REMEDY PO) Take 2 tablets by mouth every 2 (two) hours as needed (cold symptoms).     Multiple Vitamin (MULTIVITAMIN) tablet Take 1 tablet by mouth daily.     rosuvastatin (CRESTOR) 10 MG tablet Take 1 tablet (10 mg total) by mouth daily. 90 tablet 3   tamsulosin (FLOMAX) 0.4 MG CAPS capsule Take 0.4 mg by mouth daily.     No current facility-administered medications on file prior to visit.   Allergies  Allergen Reactions   Meat [Alpha-Gal] Anaphylaxis   Nylon Rash   Lotrel [Amlodipine  Besy-Benazepril Hcl] Hives and Swelling   Other Swelling    lycra    Chalk Other (See Comments)    sneezing   Social History   Socioeconomic History   Marital status: Married    Spouse name: Not on file   Number of children: Not on file   Years of education: Not on file   Highest education level: Not on file  Occupational History   Not on file  Tobacco Use   Smoking status: Never   Smokeless tobacco: Never  Vaping Use   Vaping Use: Never used  Substance and Sexual Activity   Alcohol use: No   Drug use: No   Sexual activity: Not on file  Other Topics Concern   Not on file  Social History Narrative   Not on file   Social Determinants of Health   Financial Resource Strain: Low Risk  (08/13/2022)   Overall Financial Resource Strain (CARDIA)    Difficulty of Paying Living Expenses: Not hard at all  Food Insecurity: No Food Insecurity (08/13/2022)   Hunger Vital Sign    Worried About Running Out of Food in the Last Year: Never true    Ran Out of Food in the Last Year: Never true  Transportation Needs: No Transportation Needs (08/13/2022)   PRAPARE - Administrator, Civil Service (Medical): No    Lack of Transportation (Non-Medical): No  Physical Activity: Insufficiently Active (08/13/2022)   Exercise Vital Sign    Days of Exercise per Week: 3 days    Minutes of Exercise per Session: 40 min  Stress: No Stress Concern Present (08/13/2022)   [employer] of Occupational Health - Occupational Stress Questionnaire    Feeling of Stress : Not at all  Social Connections: Socially Integrated (08/13/2022)   Social Connection and Isolation Panel [NHANES]    Frequency of Communication with Friends and Family: More than three times a week    Frequency of Social Gatherings with Friends and Family: Three times a week    Attends Religious Services: More than 4 times per year    Active Member of Clubs or Organizations: Yes    Attends Banker Meetings: More than 4  times per year    Marital Status: Married  Catering manager Violence: Not At Risk (08/13/2022)   Humiliation, Afraid, Rape, and Kick questionnaire    Fear of Current or Ex-Partner: No    Emotionally Abused: No    Physically Abused: No    Sexually Abused: No   Family History  Problem Relation Age of Onset   Thyroid cancer Mother    Lung cancer Father    Colon cancer Neg Hx       Review of Systems  All other systems reviewed and are negative.      Objective:  Physical Exam Vitals reviewed.  Constitutional:      General: He is not in acute distress.    Appearance: He is well-developed. He is not diaphoretic.  HENT:     Head: Normocephalic and atraumatic.     Right Ear: External ear normal. Tympanic membrane is bulging. Tympanic membrane has decreased mobility.     Left Ear: External ear normal.     Nose: Rhinorrhea present.     Right Turbinates: Swollen.     Left Turbinates: Swollen.     Right Sinus: Frontal sinus tenderness present.     Mouth/Throat:     Pharynx: No oropharyngeal exudate.  Eyes:     General: No scleral icterus.       Right eye: No discharge.        Left eye: No discharge.     Conjunctiva/sclera: Conjunctivae normal.     Pupils: Pupils are equal, round, and reactive to light.  Neck:     Thyroid: No thyromegaly.     Vascular: No JVD.     Trachea: No tracheal deviation.  Cardiovascular:     Rate and Rhythm: Normal rate and regular rhythm.     Heart sounds: Normal heart sounds. No murmur heard.    No friction rub. No gallop.  Pulmonary:     Effort: Pulmonary effort is normal. No respiratory distress.     Breath sounds: No stridor. Wheezing present. No rales.  Chest:     Chest wall: No tenderness.  Abdominal:     General: Bowel sounds are normal. There is no distension.     Palpations: Abdomen is soft. There is no mass.     Tenderness: There is no abdominal tenderness. There is no guarding or rebound.  Musculoskeletal:        General: No  tenderness or deformity. Normal range of motion.     Cervical back: Normal range of motion and neck supple.  Lymphadenopathy:     Cervical: No cervical adenopathy.  Skin:    General: Skin is warm.     Coloration: Skin is not pale.     Findings: No erythema or rash.  Neurological:     Mental Status: He is alert and oriented to person, place, and time.     Cranial Nerves: No cranial nerve deficit.     Motor: No abnormal muscle tone.     Coordination: Coordination normal.     Deep Tendon Reflexes: Reflexes are normal and symmetric.  Psychiatric:        Behavior: Behavior normal.        Thought Content: Thought content normal.        Judgment: Judgment normal.           Assessment & Plan:  Acute recurrent frontal sinusitis I believe the patient has an acute frontal sinusitis with eustachian tube dysfunction.  Try Levaquin 500 mg daily for 7 days with a prednisone pack.  If not improving would recommend a CT scan of the sinuses and an ENT consultation.  Continue to use Flonase 2 sprays each nostril daily I feel the patient likely had viral bronchitis or bacterial bronchitis.  This is gradually responding to Zithromax.  He is using the albuterol sparingly as needed.  If his wheezing worsens, he can start a prednisone taper pack.  Otherwise continue to use the albuterol every 6 hours as needed and allow tincture of time.  Patient states that he feels much better.

## 2022-10-07 ENCOUNTER — Encounter: Payer: Self-pay | Admitting: Family Medicine

## 2022-10-08 DIAGNOSIS — J310 Chronic rhinitis: Secondary | ICD-10-CM | POA: Diagnosis not present

## 2022-10-08 DIAGNOSIS — R0982 Postnasal drip: Secondary | ICD-10-CM | POA: Diagnosis not present

## 2022-10-08 DIAGNOSIS — J343 Hypertrophy of nasal turbinates: Secondary | ICD-10-CM | POA: Diagnosis not present

## 2022-10-08 DIAGNOSIS — J342 Deviated nasal septum: Secondary | ICD-10-CM | POA: Diagnosis not present

## 2022-10-12 ENCOUNTER — Ambulatory Visit: Payer: Medicare Other | Admitting: Family Medicine

## 2022-10-21 ENCOUNTER — Other Ambulatory Visit: Payer: Self-pay | Admitting: Otolaryngology

## 2022-10-21 DIAGNOSIS — J329 Chronic sinusitis, unspecified: Secondary | ICD-10-CM

## 2022-11-23 ENCOUNTER — Other Ambulatory Visit: Payer: Medicare Other

## 2022-11-30 ENCOUNTER — Ambulatory Visit
Admission: RE | Admit: 2022-11-30 | Discharge: 2022-11-30 | Disposition: A | Discharge status: Home / Self Care | Payer: Medicare Other | Source: Ambulatory Visit | Attending: Otolaryngology | Admitting: Otolaryngology

## 2022-11-30 DIAGNOSIS — J329 Chronic sinusitis, unspecified: Secondary | ICD-10-CM

## 2022-12-21 ENCOUNTER — Telehealth: Payer: Self-pay | Admitting: Pulmonary Disease

## 2022-12-21 MED ORDER — FLUTICASONE-SALMETEROL 115-21 MCG/ACT IN AERO: 2.0000 | INHALATION_SPRAY | Freq: Two times a day (BID) | RESPIRATORY_TRACT | 5 refills | Status: DC

## 2022-12-21 NOTE — Telephone Encounter (Signed)
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 He has been assigned to Dr. Everardo All per recall.  [phone] is his #

## 2022-12-21 NOTE — Telephone Encounter (Signed)
Advair refilled. Recall for 06/2023 in place. Closing encounter.

## 2023-01-12 DIAGNOSIS — K080 Exfoliation of teeth due to systemic causes: Secondary | ICD-10-CM | POA: Diagnosis not present

## 2023-01-26 DIAGNOSIS — H0102A Squamous blepharitis right eye, upper and lower eyelids: Secondary | ICD-10-CM | POA: Diagnosis not present

## 2023-01-26 DIAGNOSIS — H524 Presbyopia: Secondary | ICD-10-CM | POA: Diagnosis not present

## 2023-01-26 DIAGNOSIS — H0102B Squamous blepharitis left eye, upper and lower eyelids: Secondary | ICD-10-CM | POA: Diagnosis not present

## 2023-01-26 DIAGNOSIS — H2513 Age-related nuclear cataract, bilateral: Secondary | ICD-10-CM | POA: Diagnosis not present

## 2023-01-26 DIAGNOSIS — H04123 Dry eye syndrome of bilateral lacrimal glands: Secondary | ICD-10-CM | POA: Diagnosis not present

## 2023-01-28 DIAGNOSIS — R040 Epistaxis: Secondary | ICD-10-CM | POA: Diagnosis not present

## 2023-02-02 DIAGNOSIS — Z85828 Personal history of other malignant neoplasm of skin: Secondary | ICD-10-CM | POA: Diagnosis not present

## 2023-02-02 DIAGNOSIS — L821 Other seborrheic keratosis: Secondary | ICD-10-CM | POA: Diagnosis not present

## 2023-02-02 DIAGNOSIS — L820 Inflamed seborrheic keratosis: Secondary | ICD-10-CM | POA: Diagnosis not present

## 2023-02-02 DIAGNOSIS — L812 Freckles: Secondary | ICD-10-CM | POA: Diagnosis not present

## 2023-02-02 DIAGNOSIS — D1801 Hemangioma of skin and subcutaneous tissue: Secondary | ICD-10-CM | POA: Diagnosis not present

## 2023-02-05 DIAGNOSIS — H1131 Conjunctival hemorrhage, right eye: Secondary | ICD-10-CM | POA: Diagnosis not present

## 2023-02-19 ENCOUNTER — Ambulatory Visit (INDEPENDENT_AMBULATORY_CARE_PROVIDER_SITE_OTHER): Payer: Medicare Other | Admitting: Otolaryngology

## 2023-02-25 ENCOUNTER — Ambulatory Visit (INDEPENDENT_AMBULATORY_CARE_PROVIDER_SITE_OTHER): Payer: Medicare Other | Admitting: Otolaryngology

## 2023-02-25 ENCOUNTER — Encounter (INDEPENDENT_AMBULATORY_CARE_PROVIDER_SITE_OTHER): Payer: Self-pay

## 2023-02-25 VITALS — Ht 74.0 in | Wt 228.0 lb

## 2023-02-25 DIAGNOSIS — R040 Epistaxis: Secondary | ICD-10-CM | POA: Diagnosis not present

## 2023-02-25 DIAGNOSIS — Z09 Encounter for follow-up examination after completed treatment for conditions other than malignant neoplasm: Secondary | ICD-10-CM | POA: Diagnosis not present

## 2023-02-26 DIAGNOSIS — R040 Epistaxis: Secondary | ICD-10-CM | POA: Insufficient documentation

## 2023-02-26 NOTE — Progress Notes (Unsigned)
Patient ID: Thomas Livingston, male   DOB: 1951-01-08, 72 y.o.   MRN: [id number]  Follow-up: Recurrent right epistaxis  HPI: The patient is a 72 year old male who returns today for his follow-up visit.  He was last seen 1 month ago.  At that time, he was complaining of recurrent right epistaxis.  Hypervascular areas were noted on his right anterior and superior nasal septum.  He underwent endoscopic cauterization of his right nasal septum.  The patient returns today reporting no more bleeding since his last procedure.  He is able to breathe through both nostrils.  He denies any recent nasal trauma.  Exam: General: Communicates without difficulty, well nourished, no acute distress. Head: Normocephalic, no evidence injury, no tenderness, facial buttresses intact without stepoff. Face/sinus: No tenderness to palpation and percussion. Facial movement is normal and symmetric. Eyes: PERRL, EOMI. No scleral icterus, conjunctivae clear. Neuro: CN II exam reveals vision grossly intact.  No nystagmus at any point of gaze. Ears: Auricles well formed without lesions.  Ear canals are intact without mass or lesion.  No erythema or edema is appreciated.  The TMs are intact without fluid. Nose: External evaluation reveals normal support and skin without lesions.  Dorsum is intact.  Anterior rhinoscopy reveals the right nasal septum to be healing well from the cauterization procedure.  No bleeding is noted.  Oral:  Oral cavity and oropharynx are intact, symmetric, without erythema or edema.  Mucosa is moist without lesions. Neck: Full range of motion without pain.  There is no significant lymphadenopathy.  No masses palpable.  Thyroid bed within normal limits to palpation.  Parotid glands and submandibular glands equal bilaterally without mass.  Trachea is midline. Neuro:  CN 2-12 grossly intact.    Assessment: 1.  The patient is doing well status post cauterization of his right nasal septum. 2.  No significant hypervascular  area or bleeding is noted today.  Plan: 1.  The physical exam findings are reviewed with the patient. 2.  Nasal ointment/humidifier as needed. 3.  The patient is encouraged to call with any questions or concerns.

## 2023-04-01 DIAGNOSIS — J45909 Unspecified asthma, uncomplicated: Secondary | ICD-10-CM | POA: Diagnosis not present

## 2023-04-01 DIAGNOSIS — E7800 Pure hypercholesterolemia, unspecified: Secondary | ICD-10-CM | POA: Diagnosis not present

## 2023-04-01 DIAGNOSIS — I2510 Atherosclerotic heart disease of native coronary artery without angina pectoris: Secondary | ICD-10-CM | POA: Diagnosis not present

## 2023-05-09 ENCOUNTER — Other Ambulatory Visit: Payer: Self-pay | Admitting: Family Medicine

## 2023-05-09 DIAGNOSIS — E785 Hyperlipidemia, unspecified: Secondary | ICD-10-CM

## 2023-05-20 DIAGNOSIS — L820 Inflamed seborrheic keratosis: Secondary | ICD-10-CM | POA: Diagnosis not present

## 2023-05-20 DIAGNOSIS — Z85828 Personal history of other malignant neoplasm of skin: Secondary | ICD-10-CM | POA: Diagnosis not present

## 2023-05-20 DIAGNOSIS — L821 Other seborrheic keratosis: Secondary | ICD-10-CM | POA: Diagnosis not present

## 2023-05-20 DIAGNOSIS — D044 Carcinoma in situ of skin of scalp and neck: Secondary | ICD-10-CM | POA: Diagnosis not present

## 2023-06-05 DIAGNOSIS — R69 Illness, unspecified: Secondary | ICD-10-CM | POA: Diagnosis not present

## 2023-07-19 DIAGNOSIS — N5201 Erectile dysfunction due to arterial insufficiency: Secondary | ICD-10-CM | POA: Diagnosis not present

## 2023-07-19 DIAGNOSIS — N401 Enlarged prostate with lower urinary tract symptoms: Secondary | ICD-10-CM | POA: Diagnosis not present

## 2023-07-19 DIAGNOSIS — R3912 Poor urinary stream: Secondary | ICD-10-CM | POA: Diagnosis not present

## 2023-07-21 ENCOUNTER — Telehealth: Payer: Self-pay | Admitting: Pulmonary Disease

## 2023-07-21 NOTE — Telephone Encounter (Signed)
 Tonya states tiering exception for Advair has been denied. Tonya phone number is [phone] option 5.

## 2023-07-22 NOTE — Telephone Encounter (Signed)
 Could you find out what alternative formulary medications need to be tried before patient can receive Advair?

## 2023-07-22 NOTE — Telephone Encounter (Signed)
 Per letter from insurance,Tier exceptions require a trial of at least three alternative formulary medications (generic included) that are approved for the same condition as using Advair for.  Do you have a preference on a different med?

## 2023-07-23 ENCOUNTER — Other Ambulatory Visit (HOSPITAL_COMMUNITY): Payer: Self-pay

## 2023-07-23 NOTE — Telephone Encounter (Signed)
 The Advair HFA is currently covered. The patient has a deductible to meet which is causing the high cost. Alternatives in the same class are under the same circumstance. A Tier Exception does not override the deductible needing to be met.  Advair HFA: $302.30 Advair Diskus: $226.25 Symbicort: $252.20 Breo Ellipta: $385.90 Dulera: $324.73

## 2023-07-27 NOTE — Telephone Encounter (Signed)
 Will discuss with patient at next clinic visit on 07/29/23

## 2023-07-29 ENCOUNTER — Encounter (HOSPITAL_BASED_OUTPATIENT_CLINIC_OR_DEPARTMENT_OTHER): Payer: Self-pay | Admitting: Pulmonary Disease

## 2023-07-29 ENCOUNTER — Other Ambulatory Visit (HOSPITAL_BASED_OUTPATIENT_CLINIC_OR_DEPARTMENT_OTHER): Payer: Self-pay | Admitting: Pulmonary Disease

## 2023-07-29 ENCOUNTER — Ambulatory Visit (HOSPITAL_BASED_OUTPATIENT_CLINIC_OR_DEPARTMENT_OTHER): Payer: Medicare Other | Admitting: Pulmonary Disease

## 2023-07-29 VITALS — BP 118/72 | HR 61 | Ht 74.0 in | Wt 229.3 lb

## 2023-07-29 DIAGNOSIS — J4530 Mild persistent asthma, uncomplicated: Secondary | ICD-10-CM

## 2023-07-29 DIAGNOSIS — T781XXA Other adverse food reactions, not elsewhere classified, initial encounter: Secondary | ICD-10-CM | POA: Diagnosis not present

## 2023-07-29 DIAGNOSIS — D869 Sarcoidosis, unspecified: Secondary | ICD-10-CM

## 2023-07-29 MED ORDER — FLUTICASONE-SALMETEROL 115-21 MCG/ACT IN AERO: 2.0000 | INHALATION_SPRAY | Freq: Two times a day (BID) | RESPIRATORY_TRACT | 11 refills | Status: DC

## 2023-07-29 NOTE — Patient Instructions (Addendum)
 Asthma --CONTINUE Advair 115-21 TWO puffs in the morning and evening. Rinse mouth after use --CONTINUE Albuterol AS NEEDED for shortness of breath or wheeze --ORDER pulmonary function tests

## 2023-07-29 NOTE — Progress Notes (Signed)
 Subjective:   PATIENT ID: Prudy Feeler GENDER: male DOB: 01-09-1951, MRN: [id number]  Chief Complaint  Patient presents with   Establish Care    Mild persistent asthma    Reason for Visit: New patient  Mr. Ramsay "Thomas Livingston" Kaluzny is a 73 year old male never smoker with asthma, sarcoid, alpha gal allergy, pulmonary nodules, HTN who presents as a new patient for pulmonary management. Previously followed by Dr. Thora Lance.  He is on inhalers for his asthma. Recent telephone visit regarding inhaler pricing. He plans to pay off deductible. Feels Advair HFA does well. Denies shortness of breath, cough, wheezing. Rarely uses albuterol unless he is sick. Last exacerbation >1 years ago but did have influenza A two months ago with fever and body aches. May be triggered by dust, chicken feathers. 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 With his alpha gal he eats primarily poultry and no longer eats red meat. Interested in more information in his condition.  Social History: Retired Psychiatric nurse with pulmonary fibrosis  I have personally reviewed patient's past medical/family/social history, allergies, current medications.  Past Medical History:  Diagnosis Date   Allergy    Arthritis    left hip   Asthma    History of kidney stones    Hyperlipidemia    Hypertension 2012   no bp meds since 2012   Multiple lung nodules on CT 2007   no current follow up done   Psoriasis    both legs   Urticaria    Urticaria 03/31/2018     Family History  Problem Relation Age of Onset   Thyroid cancer Mother    Lung cancer Father    Colon cancer Neg Hx      Social History   Occupational History   Not on file  Tobacco Use   Smoking status: Never   Smokeless tobacco: Never  Vaping Use   Vaping status: Never Used  Substance and Sexual Activity   Alcohol use: No   Drug use: No   Sexual activity: Not on file    Allergies  Allergen Reactions   Meat [Alpha-Gal]  Anaphylaxis   Nylon Rash   Lotrel [Amlodipine Besy-Benazepril Hcl] Hives and Swelling   Other Swelling    lycra    Chalk Other (See Comments)    sneezing     Outpatient Medications Prior to Visit  Medication Sig Dispense Refill   albuterol (VENTOLIN HFA) 108 (90 Base) MCG/ACT inhaler Inhale 2 puffs into the lungs every 6 (six) hours as needed for wheezing or shortness of breath. 8 g 0   aspirin EC 81 MG tablet Take 81 mg by mouth in the morning. Swallow whole.     EPINEPHrine 0.3 mg/0.3 mL IJ SOAJ injection Inject 0.3 mg into the muscle as needed for anaphylaxis. 2 each 1   fexofenadine (ALLEGRA) 180 MG tablet Take 180 mg by mouth daily. [place] Signature Aller-Fex Antihistamine 180 mg     fluticasone (FLONASE) 50 MCG/ACT nasal spray Use 2 sprays in each  nostril daily (Patient taking differently: 2 sprays every evening. Use 2 sprays in each  nostril daily) 48 g 3   Homeopathic Products (COLD REMEDY PO) Take 2 tablets by mouth every 2 (two) hours as needed (cold symptoms).     Multiple Vitamin (MULTIVITAMIN) tablet Take 1 tablet by mouth daily.     rosuvastatin (CRESTOR) 10 MG tablet TAKE 1 TABLET BY MOUTH EVERY DAY 90 tablet 3   tamsulosin (FLOMAX)  0.4 MG CAPS capsule Take 0.4 mg by mouth daily.     fluticasone-salmeterol (ADVAIR HFA) 115-21 MCG/ACT inhaler Inhale 2 puffs into the lungs 2 (two) times daily. 1 each 5   predniSONE (DELTASONE) 20 MG tablet 3 tabs poqday 1-2, 2 tabs poqday 3-4, 1 tab poqday 5-6 (Patient not taking: Reported on 07/29/2023) 12 tablet 0   No facility-administered medications prior to visit.    Review of Systems  Constitutional:  Negative for chills, diaphoresis, fever, malaise/fatigue and weight loss.  HENT:  Negative for congestion.   Respiratory:  Negative for cough, hemoptysis, sputum production, shortness of breath and wheezing.   Cardiovascular:  Negative for chest pain, palpitations and leg swelling.     Objective:   Vitals:   07/29/23 0840  BP:  118/72  Pulse: 61  SpO2: 98%  Weight: 229 lb 4.8 oz (104 kg)  Height: 6\' 2"  (1.88 m)   SpO2: 98 %  Physical Exam: General: Well-appearing, no acute distress HENT: [place], AT Eyes: EOMI, no scleral icterus Respiratory: Clear to auscultation bilaterally.  No crackles, wheezing or rales Cardiovascular: RRR, -M/R/G, no JVD Extremities:-Edema,-tenderness Neuro: AAO x4, CNII-XII grossly intact Psych: Normal mood, normal affect  Data Reviewed:  Imaging: CT Chest 04/11/21 - calcified mediastinal lymphadenopathy, predominantly upper lobe nodular and mildly fibrotic parenchyma with perilymphatic distribution nodularity.  CT Chest 10/01/21 - calcified bilateral hilar lymphadenopathy, predominantly upper lobe nodular and mildly fibrotic parenchyma with perilymphatic distribution nodularity. Unchanged compared to 04/11/21  PFT: 03/31/18 FVC 4.16 (82%) FEV1 3.03 (80%) Ratio 73   Interpretation: Normal spirometry  07/02/21 FVC 4.35 (86%) FEV1 2.78 (74%) Ratio 64  TLC 86% DLCO 78% Interpretation: Normal spirometry. No obstructive or restrictive defect. Borderline bronchodilator response. Mildly reduced DLCO  10/15/21 FVC 4.14 (82%) FEV1 2.72 (73%) Ratio 66   Interpretation: Normal spirometry  06/22/22 FVC 4.00 (77%) FEV1 2.78 (72%) Ratio 70   Interpretation: Normal spirometry  Labs:    Latest Ref Rng & Units 09/04/2022    8:08 AM 12/30/2021   12:43 PM 07/31/2020    8:57 AM  CBC  WBC 3.8 - 10.8 Thousand/uL 7.9  6.7  7.4   Hemoglobin 13.2 - 17.1 g/dL 57.8  46.9  62.9   Hematocrit 38.5 - 50.0 % 42.8  43.1  43.1   Platelets 140 - 400 Thousand/uL 220  244  258       Latest Ref Rng & Units 09/04/2022    8:08 AM 12/30/2021   12:43 PM 07/31/2020    8:57 AM  CMP  Glucose 65 - 99 mg/dL 95  89  79   BUN 7 - 25 mg/dL 8  13  9    Creatinine 0.70 - 1.35 mg/dL 5.28  4.13  2.44   Sodium 135 - 146 mmol/L 138  139  140   Potassium 3.5 - 5.3 mmol/L 4.3  4.5  4.8   Chloride 98 - 110 mmol/L 103  103  101    CO2 20 - 32 mmol/L 29  28  30    Calcium 8.6 - 10.3 mg/dL 8.8  9.2  9.3   Total Protein 6.1 - 8.1 g/dL 6.1  6.3  6.6   Total Bilirubin 0.2 - 1.2 mg/dL 0.5  0.6  0.5   AST 10 - 35 U/L 19  19  20    ALT 9 - 46 U/L 16  16  17      07/31/20    Component Ref Range & Units (hover) 2 yr ago  Galactose-alpha-1,3-galactose  IgE 9.68 High   Comment: . Previous reports (JACI [id number]) have demonstrated that patients with IgE antibodies to galactose-a-1,3-galactose are at risk for delayed anaphylaxis, angioedema, or urticaria following consumption of beef, pork, or lamb.  Beef IgE 6.79 High   Class 3  LAMB/MUTTON IGE 1.29 High   Class 2  Pork IgE 2.86 High   Class 2  Comment: . The test method is the [employer] ImmunoCAP allergen-specific IgE system. CLASS INTERPRETATION   <0.10 kU/L= 0, Negative; 0.10 - 0.34 kU/L= 0/1, Equivocal/Borderline;  0.35 - 0.69  kU/L=1, Low Positive; 0.70 - 3.49 kU/L=2, Moderate Positive;  3.50  - 17.49 kU/L=3, High Positive; 17.50 - 49.99 kU/L= 4, Very High Positive; 50.00 - 99.99  kU/L= 5, Very High Positive;   >99.99 kU/L=6, Very High Positive . *This test was developed and its performance characteristics determined by [employer]. It has not been cleared or approved by the U.S. Food and Drug Administration.     Assessment & Plan:   Discussion: 73 year old male never smoker with asthma, sarcoid, alpha gal allergy, pulmonary nodules, HTN who presents as a new patient for pulmonary management. Previously followed by Dr. Thora Lance.  Discussed clinical course and management of asthma including bronchodilator regimen, preventive care and action plan for exacerbation.  Sarcoid history reviewed. Asymptomatic with stable chest imaging and PFTs >5 years. Plan to continue surveillance with PFTss every 18 months. Chest imaging indicated only if symptomatic or if PFTs worsen.   Mild persistent asthma --CONTINUE Advair 115-21 TWO puffs in the morning and evening.  Rinse mouth after use --CONTINUE Albuterol AS NEEDED for shortness of breath or wheeze --ORDER pulmonary function tests  Pulmonary sarcoidosis - asymptomatic --Initially seen 1989 during SWAT team evaluation, in 2007 via CT with hilar adenopathy  but never had biopsy --No history of treatment/immunosuppression --Stable CT with calcified lymphadenopathy. No further imaging indicated  Sarcoid Monitoring --Recent chest imaging reviewed.  --Annual PFTs.  Last PFTs 06/22/22 --Annual ophthalmology exam.  Last visit on 2025 --Routine labs as needed: CBC with diff, CMET, 1, 25 and 25 hydroxy vitamin D, urinary calcium  Alpha gal allergy --REFER to [facility] with Dr. Lorin Picket Commins  Health Maintenance Immunization History  Administered Date(s) Administered   Fluad Quad(high Dose 65+) 05/18/2019   Influenza, High Dose Seasonal PF 03/01/2018   Influenza,inj,Quad PF,6+ Mos 02/27/2014, 06/25/2015, 02/20/2016   Influenza-Unspecified 03/02/2017   PFIZER(Purple Top)SARS-COV-2 Vaccination 07/14/2019, 08/09/2019   PNEUMOCOCCAL CONJUGATE-20 06/04/2021   Pneumococcal Conjugate-13 03/17/2018   Tdap 11/14/2009, 12/16/2013   Zoster, Live 02/27/2014   CT Lung Screen - not qualified  Orders Placed This Encounter  Procedures   Pulmonary function test    Standing Status:   Future    Expiration Date:   07/28/2024    Where should this test be performed?:   [facility]    Full PFT: includes the following: basic spirometry, spirometry pre & post bronchodilator, diffusion capacity (DLCO), lung volumes:   Full PFT   Meds ordered this encounter  Medications   fluticasone-salmeterol (ADVAIR HFA) 115-21 MCG/ACT inhaler    Sig: Inhale 2 puffs into the lungs 2 (two) times daily.    Dispense:  1 each    Refill:  11    Return in about 7 months (around 02/28/2024) for after PFT. PFTs in fall and if stable can return to annual follow-up  I have spent a total time of 45-minutes on the day of the appointment  reviewing prior documentation, coordinating care and discussing medical  diagnosis and plan with the patient/family. Imaging, labs and tests included in this note have been reviewed and interpreted independently by me.  Andelyn Spade Mechele Collin, MD [facility] 07/29/2023 9:08 AM

## 2023-07-29 NOTE — Telephone Encounter (Signed)
 Please advise pharmacy note for change as not covered by insurance

## 2023-08-11 DIAGNOSIS — K080 Exfoliation of teeth due to systemic causes: Secondary | ICD-10-CM | POA: Diagnosis not present

## 2023-09-23 DIAGNOSIS — E7800 Pure hypercholesterolemia, unspecified: Secondary | ICD-10-CM | POA: Diagnosis not present

## 2023-09-23 DIAGNOSIS — J45909 Unspecified asthma, uncomplicated: Secondary | ICD-10-CM | POA: Diagnosis not present

## 2023-09-27 DIAGNOSIS — L821 Other seborrheic keratosis: Secondary | ICD-10-CM | POA: Diagnosis not present

## 2023-09-27 DIAGNOSIS — L820 Inflamed seborrheic keratosis: Secondary | ICD-10-CM | POA: Diagnosis not present

## 2023-09-27 DIAGNOSIS — L570 Actinic keratosis: Secondary | ICD-10-CM | POA: Diagnosis not present

## 2023-09-27 DIAGNOSIS — Z85828 Personal history of other malignant neoplasm of skin: Secondary | ICD-10-CM | POA: Diagnosis not present

## 2023-10-07 ENCOUNTER — Ambulatory Visit

## 2023-10-07 DIAGNOSIS — Z0000 Encounter for general adult medical examination without abnormal findings: Secondary | ICD-10-CM | POA: Diagnosis not present

## 2023-10-07 NOTE — Progress Notes (Signed)
 Subjective:   Thomas Livingston is a 73 y.o. male who presents for Medicare Annual/Subsequent preventive examination.  Visit Complete: Virtual I connected with  Arren A Maiers on 10/07/23 by a audio enabled telemedicine application and verified that I am speaking with the correct person using two identifiers.  Patient Location: Home  Provider Location: Home Office  I discussed the limitations of evaluation and management by telemedicine. The patient expressed understanding and agreed to proceed.  Vital Signs: Because this visit was a virtual/telehealth visit, some criteria may be missing or patient reported. Any vitals not documented were not able to be obtained and vitals that have been documented are patient reported.  Cardiac Risk Factors include: advanced age (>64men, >63 women);male gender     Objective:     There were no vitals filed for this visit. There is no height or weight on file to calculate BMI.     10/07/2023   11:47 AM 08/13/2022    9:51 AM 08/07/2021    9:52 AM 05/13/2021   10:56 AM 11/27/2020    9:51 PM 09/25/2020    4:40 AM 08/13/2020    6:01 PM  Advanced Directives  Does Patient Have a Medical Advance Directive? Yes Yes Yes Yes No No No  Type of Advance Directive Healthcare Power of Attorney Living will;Healthcare Power of [employer] Power of [place];Living will Healthcare Power of [place];Living will     Does patient want to make changes to medical advance directive?  No - Patient declined       Copy of Healthcare Power of Attorney in Chart? No - copy requested No - copy requested No - copy requested No - copy requested     Would patient like information on creating a medical advance directive?   No - Patient declined   No - Patient declined     Current Medications (verified) Outpatient Encounter Medications as of 10/07/2023  Medication Sig   albuterol  (VENTOLIN  HFA) 108 (90 Base) MCG/ACT inhaler Inhale 2 puffs into the lungs every 6 (six) hours as  needed for wheezing or shortness of breath.   aspirin EC 81 MG tablet Take 81 mg by mouth in the morning. Swallow whole.   EPINEPHrine  0.3 mg/0.3 mL IJ SOAJ injection Inject 0.3 mg into the muscle as needed for anaphylaxis.   fexofenadine  (ALLEGRA ) 180 MG tablet Take 180 mg by mouth daily. [place] Signature Aller-Fex Antihistamine 180 mg   fluticasone  (FLONASE ) 50 MCG/ACT nasal spray Use 2 sprays in each  nostril daily (Patient taking differently: 2 sprays every evening. Use 2 sprays in each  nostril daily)   fluticasone -salmeterol (ADVAIR  HFA) 115-21 MCG/ACT inhaler INHALE 2 PUFFS INTO THE LUNGS TWICE A DAY   Homeopathic Products (COLD REMEDY PO) Take 2 tablets by mouth every 2 (two) hours as needed (cold symptoms).   Multiple Vitamin (MULTIVITAMIN) tablet Take 1 tablet by mouth daily.   predniSONE  (DELTASONE ) 20 MG tablet 3 tabs poqday 1-2, 2 tabs poqday 3-4, 1 tab poqday 5-6   rosuvastatin  (CRESTOR ) 10 MG tablet TAKE 1 TABLET BY MOUTH EVERY DAY   tamsulosin  (FLOMAX ) 0.4 MG CAPS capsule Take 0.4 mg by mouth daily.   No facility-administered encounter medications on file as of 10/07/2023.    Allergies (verified) Meat [alpha-gal], Nylon, Lotrel [amlodipine besy-benazepril hcl], Other, and Chalk   History: Past Medical History:  Diagnosis Date   Allergy    Arthritis    left hip   Asthma    History of kidney stones  Hyperlipidemia    Hypertension 2012   no bp meds since 2012   Multiple lung nodules on CT 2007   no current follow up done   Psoriasis    both legs   Urticaria    Urticaria 03/31/2018   Past Surgical History:  Procedure Laterality Date   BRONCHIAL WASHINGS  05/13/2021   Procedure: BRONCHIAL WASHINGS;  Surgeon: Gloriajean Large, MD;  Location: [facility];  Service: Pulmonary;;   CHOLECYSTECTOMY  2007   ENDOBRONCHIAL ULTRASOUND N/A 05/13/2021   Procedure: ENDOBRONCHIAL ULTRASOUND;  Surgeon: Gloriajean Large, MD;  Location: [facility];  Service: Pulmonary;   Laterality: N/A;   EUS N/A 06/28/2014   Procedure: LOWER ENDOSCOPIC ULTRASOUND (EUS);  Surgeon: Janel Medford, MD;  Location: [facility];  Service: Endoscopy;  Laterality: N/A;   EXTRACORPOREAL SHOCK WAVE LITHOTRIPSY N/A 03/21/2018   Procedure: EXTRACORPOREAL SHOCK WAVE LITHOTRIPSY (ESWL);  Surgeon: Homero Luster, MD;  Location: [facility];  Service: Urology;  Laterality: N/A;   FINE NEEDLE ASPIRATION  05/13/2021   Procedure: FINE NEEDLE ASPIRATION (FNA) LINEAR;  Surgeon: Gloriajean Large, MD;  Location: [facility];  Service: Pulmonary;;   HIP ARTHROPLASTY Left 2018   MOUTH SURGERY  1972   PROCTOSCOPY N/A 08/27/2014   Procedure: PROCTOSCOPY;  Surgeon: Joyce Nixon, MD;  Location: [facility];  Service: General;  Laterality: N/A;   TRANSANAL RECTAL RESECTION N/A 08/27/2014   Procedure: TRANSANAL RESECTION OF RECTAL TUMOR;  Surgeon: Joyce Nixon, MD;  Location: [facility];  Service: General;  Laterality: N/A;   VIDEO BRONCHOSCOPY N/A 05/13/2021   Procedure: VIDEO BRONCHOSCOPY WITHOUT FLUORO;  Surgeon: Gloriajean Large, MD;  Location: [facility];  Service: Pulmonary;  Laterality: N/A;   Family History  Problem Relation Age of Onset   Thyroid  cancer Mother    Lung cancer Father    Colon cancer Neg Hx    Social History   Socioeconomic History   Marital status: Married    Spouse name: Not on file   Number of children: Not on file   Years of education: Not on file   Highest education level: Associate degree: academic program  Occupational History   Not on file  Tobacco Use   Smoking status: Never   Smokeless tobacco: Never  Vaping Use   Vaping status: Never Used  Substance and Sexual Activity   Alcohol use: No   Drug use: No   Sexual activity: Not Currently  Other Topics Concern   Not on file  Social History Narrative   Not on file   Social Drivers of Health   Financial Resource Strain: Low Risk  (10/07/2023)   Overall Financial Resource Strain (CARDIA)    Difficulty of Paying  Living Expenses: Not hard at all  Food Insecurity: No Food Insecurity (10/07/2023)   Hunger Vital Sign    Worried About Running Out of Food in the Last Year: Never true    Ran Out of Food in the Last Year: Never true  Transportation Needs: No Transportation Needs (10/07/2023)   PRAPARE - Administrator, Civil Service (Medical): No    Lack of Transportation (Non-Medical): No  Physical Activity: Insufficiently Active (10/07/2023)   Exercise Vital Sign    Days of Exercise per Week: 3 days    Minutes of Exercise per Session: 30 min  Stress: No Stress Concern Present (10/07/2023)   [employer] of Occupational Health - Occupational Stress Questionnaire    Feeling of Stress : Not at all  Social  Connections: Socially Integrated (10/07/2023)   Social Connection and Isolation Panel [NHANES]    Frequency of Communication with Friends and Family: More than three times a week    Frequency of Social Gatherings with Friends and Family: Three times a week    Attends Religious Services: More than 4 times per year    Active Member of Clubs or Organizations: Yes    Attends Engineer, structural: More than 4 times per year    Marital Status: Married    Tobacco Counseling Counseling given: Not Answered   Clinical Intake:  Pre-visit preparation completed: Yes  Pain : No/denies pain     Diabetes: No  How often do you need to have someone help you when you read instructions, pamphlets, or other written materials from your doctor or pharmacy?: 1 - Never  Interpreter Needed?: No  Information entered by :: Kieth Pelt LPN   Activities of Daily Living    10/07/2023   11:52 AM  In your present state of health, do you have any difficulty performing the following activities:  Hearing? 1  Vision? 0  Difficulty concentrating or making decisions? 0  Walking or climbing stairs? 0  Dressing or bathing? 0  Doing errands, shopping? 0  Preparing Food and eating ? N  Using the  Toilet? N  In the past six months, have you accidently leaked urine? N  Do you have problems with loss of bowel control? N  Managing your Medications? N  Managing your Finances? N  Housekeeping or managing your Housekeeping? N    Patient Care Team: Austine Lefort, MD as PCP - General (Family Medicine) [facility], [facility] Gloriajean Large, MD as Consulting Physician (Pulmonary Disease)  Indicate any recent Medical Services you may have received from other than [facility] providers in the past year (date may be approximate).     Assessment:    This is a routine wellness examination for [place].  Hearing/Vision screen Hearing Screening - Comments:: Bilateral hearing aids Vision Screening - Comments:: Up to date Unsure of name   Goals Addressed             This Visit's Progress    Exercise 3x per week (30 min per time)   Not on track    Increase exercise. Travel more.      Patient Stated       Stay healthy       Depression Screen    10/07/2023   11:54 AM 10/01/2022    3:38 PM 09/10/2022    9:43 AM 08/13/2022    9:50 AM 12/30/2021   12:14 PM 08/07/2021    9:50 AM 07/31/2020    8:29 AM  PHQ 2/9 Scores  PHQ - 2 Score 0 0 0 0 0 0 0  PHQ- 9 Score 4          Fall Risk    10/07/2023   11:43 AM 10/01/2022    3:38 PM 09/10/2022    9:43 AM 08/13/2022    9:50 AM 08/10/2022    4:57 PM  Fall Risk   Falls in the past year? 0 0 0 0 0  Number falls in past yr: 0 0 0 0 0  Injury with Fall? 0 0 0 0 0  Risk for fall due to :  No Fall Risks No Fall Risks No Fall Risks   Follow up Falls evaluation completed;Education provided;Falls prevention discussed Falls prevention discussed Falls prevention discussed Falls prevention discussed;Education provided;Falls evaluation completed  MEDICARE RISK AT HOME: Medicare Risk at Home Any stairs in or around the home?: No If so, are there any without handrails?: No Home free of loose throw rugs in walkways, pet beds, electrical  cords, etc?: Yes Adequate lighting in your home to reduce risk of falls?: Yes Life alert?: No Use of a cane, walker or w/c?: No Grab bars in the bathroom?: Yes Shower chair or bench in shower?: No Elevated toilet seat or a handicapped toilet?: Yes  TIMED UP AND GO:  Was the test performed?  No    Cognitive Function:        10/07/2023   11:49 AM 08/13/2022    9:51 AM  6CIT Screen  What Year? 0 points 0 points  What month? 0 points 0 points  What time? 0 points 0 points  Count back from 20 0 points 0 points  Months in reverse 0 points 0 points  Repeat phrase 0 points 0 points  Total Score 0 points 0 points    Immunizations Immunization History  Administered Date(s) Administered   Fluad Quad(high Dose 65+) 05/18/2019   Influenza, High Dose Seasonal PF 03/01/2018   Influenza,inj,Quad PF,6+ Mos 02/27/2014, 06/25/2015, 02/20/2016   Influenza-Unspecified 03/02/2017   PFIZER(Purple Top)SARS-COV-2 Vaccination 07/14/2019, 08/09/2019   PNEUMOCOCCAL CONJUGATE-20 06/04/2021   Pneumococcal Conjugate-13 03/17/2018   Tdap 11/14/2009, 12/16/2013   Zoster, Live 02/27/2014    TDAP status: Up to date  Flu Vaccine status: Up to date  Pneumococcal vaccine status: Up to date  Covid-19 vaccine status: Information provided on how to obtain vaccines.   Qualifies for Shingles Vaccine? Yes   Zostavax completed No   Shingrix Completed?: Yes  Screening Tests Health Maintenance  Topic Date Due   Zoster Vaccines- Shingrix (1 of 2) 11/17/2002   COVID-19 Vaccine (3 - 2024-25 season) 10/23/2023 (Originally 01/10/2023)   INFLUENZA VACCINE  12/10/2023   DTaP/Tdap/Td (3 - Td or Tdap) 12/17/2023   Colonoscopy  06/11/2024   Medicare Annual Wellness (AWV)  10/06/2024   Pneumonia Vaccine 31+ Years old  Completed   Hepatitis C Screening  Completed   HPV VACCINES  Aged Out   Meningococcal B Vaccine  Aged Out    Health Maintenance  Health Maintenance Due  Topic Date Due   Zoster Vaccines-  Shingrix (1 of 2) 11/17/2002    Colorectal cancer screening: Type of screening: Colonoscopy. Completed 2016. Repeat every 10 years  Lung Cancer Screening: (Low Dose CT Chest recommended if Age 6-80 years, 20 pack-year currently smoking OR have quit w/in 15years.) does not qualify.   Lung Cancer Screening Referral:   Additional Screening:  Hepatitis C Screening: does not qualify; Completed 2017  Vision Screening: Recommended annual ophthalmology exams for early detection of glaucoma and other disorders of the eye. Is the patient up to date with their annual eye exam?  Yes  Who is the provider or what is the name of the office in which the patient attends annual eye exams? Unsure of name If pt is not established with a provider, would they like to be referred to a provider to establish care? No .   Dental Screening: Recommended annual dental exams for proper oral hygiene    Community Resource Referral / Chronic Care Management: CRR required this visit?  No   CCM required this visit?  No     Plan:     I have personally reviewed and noted the following in the patient's chart:   Medical and social history Use of alcohol,  tobacco or illicit drugs  Current medications and supplements including opioid prescriptions. Patient is not currently taking opioid prescriptions. Functional ability and status Nutritional status Physical activity Advanced directives List of other physicians Hospitalizations, surgeries, and ER visits in previous 12 months Vitals Screenings to include cognitive, depression, and falls Referrals and appointments  In addition, I have reviewed and discussed with patient certain preventive protocols, quality metrics, and best practice recommendations. A written personalized care plan for preventive services as well as general preventive health recommendations were provided to patient.     Kieth Pelt, LPN   05/29/1476   After Visit Summary: (MyChart) Due to  this being a telephonic visit, the after visit summary with patients personalized plan was offered to patient via MyChart   Nurse Notes:

## 2023-10-07 NOTE — Patient Instructions (Signed)
 [name] , Thank you for taking time to come for your Medicare Wellness Visit. I appreciate your ongoing commitment to your health goals. Please review the following plan we discussed and let me know if I can assist you in the future.   Screening recommendations/referrals: Colonoscopy: up to date Recommended yearly ophthalmology/optometry visit for glaucoma screening and checkup Recommended yearly dental visit for hygiene and checkup  Vaccinations: Influenza vaccine: up to date Pneumococcal vaccine: up to date Tdap vaccine: up to date Shingles vaccine: Education provided      Preventive Care 65 Years and Older, Male Preventive care refers to lifestyle choices and visits with your health care provider that can promote health and wellness. What does preventive care include? A yearly physical exam. This is also called an annual well check. Dental exams once or twice a year. Routine eye exams. Ask your health care provider how often you should have your eyes checked. Personal lifestyle choices, including: Daily care of your teeth and gums. Regular physical activity. Eating a healthy diet. Avoiding tobacco and drug use. Limiting alcohol use. Practicing safe sex. Taking low doses of aspirin every day. Taking vitamin and mineral supplements as recommended by your health care provider. What happens during an annual well check? The services and screenings done by your health care provider during your annual well check will depend on your age, overall health, lifestyle risk factors, and family history of disease. Counseling  Your health care provider may ask you questions about your: Alcohol use. Tobacco use. Drug use. Emotional well-being. Home and relationship well-being. Sexual activity. Eating habits. History of falls. Memory and ability to understand (cognition). Work and work Astronomer. Screening  You may have the following tests or measurements: Height, weight, and  BMI. Blood pressure. Lipid and cholesterol levels. These may be checked every 5 years, or more frequently if you are over 69 years old. Skin check. Lung cancer screening. You may have this screening every year starting at age 33 if you have a 30-pack-year history of smoking and currently smoke or have quit within the past 15 years. Fecal occult blood test (FOBT) of the stool. You may have this test every year starting at age 2. Flexible sigmoidoscopy or colonoscopy. You may have a sigmoidoscopy every 5 years or a colonoscopy every 10 years starting at age 71. Prostate cancer screening. Recommendations will vary depending on your family history and other risks. Hepatitis C blood test. Hepatitis B blood test. Sexually transmitted disease (STD) testing. Diabetes screening. This is done by checking your blood sugar (glucose) after you have not eaten for a while (fasting). You may have this done every 1-3 years. Abdominal aortic aneurysm (AAA) screening. You may need this if you are a current or former smoker. Osteoporosis. You may be screened starting at age 59 if you are at high risk. Talk with your health care provider about your test results, treatment options, and if necessary, the need for more tests. Vaccines  Your health care provider may recommend certain vaccines, such as: Influenza vaccine. This is recommended every year. Tetanus, diphtheria, and acellular pertussis (Tdap, Td) vaccine. You may need a Td booster every 10 years. Zoster vaccine. You may need this after age 44. Pneumococcal 13-valent conjugate (PCV13) vaccine. One dose is recommended after age 31. Pneumococcal polysaccharide (PPSV23) vaccine. One dose is recommended after age 85. Talk to your health care provider about which screenings and vaccines you need and how often you need them. This information is not intended to  replace advice given to you by your health care provider. Make sure you discuss any questions you have  with your health care provider. Document Released: 05/24/2015 Document Revised: 01/15/2016 Document Reviewed: 02/26/2015 Elsevier Interactive Patient Education  2017 [employer].  Fall Prevention in the Home Falls can cause injuries. They can happen to people of all ages. There are many things you can do to make your home safe and to help prevent falls. What can I do on the outside of my home? Regularly fix the edges of walkways and driveways and fix any cracks. Remove anything that might make you trip as you walk through a door, such as a raised step or threshold. Trim any bushes or trees on the path to your home. Use bright outdoor lighting. Clear any walking paths of anything that might make someone trip, such as rocks or tools. Regularly check to see if handrails are loose or broken. Make sure that both sides of any steps have handrails. Any raised decks and porches should have guardrails on the edges. Have any leaves, snow, or ice cleared regularly. Use sand or salt on walking paths during winter. Clean up any spills in your garage right away. This includes oil or grease spills. What can I do in the bathroom? Use night lights. Install grab bars by the toilet and in the tub and shower. Do not use towel bars as grab bars. Use non-skid mats or decals in the tub or shower. If you need to sit down in the shower, use a plastic, non-slip stool. Keep the floor dry. Clean up any water that spills on the floor as soon as it happens. Remove soap buildup in the tub or shower regularly. Attach bath mats securely with double-sided non-slip rug tape. Do not have throw rugs and other things on the floor that can make you trip. What can I do in the bedroom? Use night lights. Make sure that you have a light by your bed that is easy to reach. Do not use any sheets or blankets that are too big for your bed. They should not hang down onto the floor. Have a firm chair that has side arms. You can use  this for support while you get dressed. Do not have throw rugs and other things on the floor that can make you trip. What can I do in the kitchen? Clean up any spills right away. Avoid walking on wet floors. Keep items that you use a lot in easy-to-reach places. If you need to reach something above you, use a strong step stool that has a grab bar. Keep electrical cords out of the way. Do not use floor polish or wax that makes floors slippery. If you must use wax, use non-skid floor wax. Do not have throw rugs and other things on the floor that can make you trip. What can I do with my stairs? Do not leave any items on the stairs. Make sure that there are handrails on both sides of the stairs and use them. Fix handrails that are broken or loose. Make sure that handrails are as long as the stairways. Check any carpeting to make sure that it is firmly attached to the stairs. Fix any carpet that is loose or worn. Avoid having throw rugs at the top or bottom of the stairs. If you do have throw rugs, attach them to the floor with carpet tape. Make sure that you have a light switch at the top of the stairs and the  bottom of the stairs. If you do not have them, ask someone to add them for you. What else can I do to help prevent falls? Wear shoes that: Do not have high heels. Have rubber bottoms. Are comfortable and fit you well. Are closed at the toe. Do not wear sandals. If you use a stepladder: Make sure that it is fully opened. Do not climb a closed stepladder. Make sure that both sides of the stepladder are locked into place. Ask someone to hold it for you, if possible. Clearly mark and make sure that you can see: Any grab bars or handrails. First and last steps. Where the edge of each step is. Use tools that help you move around (mobility aids) if they are needed. These include: Canes. Walkers. Scooters. Crutches. Turn on the lights when you go into a dark area. Replace any light bulbs  as soon as they burn out. Set up your furniture so you have a clear path. Avoid moving your furniture around. If any of your floors are uneven, fix them. If there are any pets around you, be aware of where they are. Review your medicines with your doctor. Some medicines can make you feel dizzy. This can increase your chance of falling. Ask your doctor what other things that you can do to help prevent falls. This information is not intended to replace advice given to you by your health care provider. Make sure you discuss any questions you have with your health care provider. Document Released: 02/21/2009 Document Revised: 10/03/2015 Document Reviewed: 06/01/2014 Elsevier Interactive Patient Education  2017 [employer].

## 2023-10-18 ENCOUNTER — Ambulatory Visit (INDEPENDENT_AMBULATORY_CARE_PROVIDER_SITE_OTHER): Admitting: Family Medicine

## 2023-10-18 ENCOUNTER — Encounter: Payer: Self-pay | Admitting: Family Medicine

## 2023-10-18 VITALS — BP 106/62 | HR 57 | Ht 74.0 in | Wt 233.8 lb

## 2023-10-18 DIAGNOSIS — Z0000 Encounter for general adult medical examination without abnormal findings: Secondary | ICD-10-CM

## 2023-10-18 DIAGNOSIS — E7800 Pure hypercholesterolemia, unspecified: Secondary | ICD-10-CM

## 2023-10-18 DIAGNOSIS — Z125 Encounter for screening for malignant neoplasm of prostate: Secondary | ICD-10-CM | POA: Diagnosis not present

## 2023-10-18 DIAGNOSIS — I2510 Atherosclerotic heart disease of native coronary artery without angina pectoris: Secondary | ICD-10-CM | POA: Diagnosis not present

## 2023-10-18 DIAGNOSIS — D869 Sarcoidosis, unspecified: Secondary | ICD-10-CM

## 2023-10-18 DIAGNOSIS — Z0001 Encounter for general adult medical examination with abnormal findings: Secondary | ICD-10-CM | POA: Diagnosis not present

## 2023-10-18 DIAGNOSIS — Z23 Encounter for immunization: Secondary | ICD-10-CM | POA: Diagnosis not present

## 2023-10-18 DIAGNOSIS — M79671 Pain in right foot: Secondary | ICD-10-CM

## 2023-10-18 NOTE — Progress Notes (Signed)
 Subjective:    Patient ID: Thomas Livingston, male    DOB: Feb 08, 1951, 73 y.o.   MRN: [id number]   Patient is a very pleasant 73 year old Caucasian male here today for complete physical exam.  Last colonoscopy was in 2016 and was within normal limits.  Next colonoscopy is due in 2026.  He is due for a PSA to screen for prostate cancer.  He does not smoke and therefore does not require lung cancer screening.  His immunizations are listed below.  Patient does report pain behind the medial malleolus on his right ankle.  He is hypersensitive to touch around the posterior tibialis artery.  There is no bruising or swelling in that area.  I believe the patient may likely have tendinitis and the inflammation may be causing nerve irritation.  Otherwise he is doing well with no concerns. Immunization History  Administered Date(s) Administered   Fluad Quad(high Dose 65+) 05/18/2019   Influenza, High Dose Seasonal PF 03/01/2018   Influenza,inj,Quad PF,6+ Mos 02/27/2014, 06/25/2015, 02/20/2016   Influenza-Unspecified 03/02/2017   PFIZER(Purple Top)SARS-COV-2 Vaccination 07/14/2019, 08/09/2019   PNEUMOCOCCAL CONJUGATE-20 06/04/2021   Pneumococcal Conjugate-13 03/17/2018   Tdap 11/14/2009, 12/16/2013   Zoster, Live 02/27/2014       Past Medical History:  Diagnosis Date   Allergy    Arthritis    left hip   Asthma    History of kidney stones    Hyperlipidemia    Hypertension 2012   no bp meds since 2012   Multiple lung nodules on CT 2007   no current follow up done   Psoriasis    both legs   Urticaria    Urticaria 03/31/2018   Past Surgical History:  Procedure Laterality Date   BRONCHIAL WASHINGS  05/13/2021   Procedure: BRONCHIAL WASHINGS;  Surgeon: Gloriajean Large, MD;  Location: [facility];  Service: Pulmonary;;   CHOLECYSTECTOMY  2007   ENDOBRONCHIAL ULTRASOUND N/A 05/13/2021   Procedure: ENDOBRONCHIAL ULTRASOUND;  Surgeon: Gloriajean Large, MD;  Location: [facility];  Service:  Pulmonary;  Laterality: N/A;   EUS N/A 06/28/2014   Procedure: LOWER ENDOSCOPIC ULTRASOUND (EUS);  Surgeon: Janel Medford, MD;  Location: [facility];  Service: Endoscopy;  Laterality: N/A;   EXTRACORPOREAL SHOCK WAVE LITHOTRIPSY N/A 03/21/2018   Procedure: EXTRACORPOREAL SHOCK WAVE LITHOTRIPSY (ESWL);  Surgeon: Homero Luster, MD;  Location: [facility];  Service: Urology;  Laterality: N/A;   FINE NEEDLE ASPIRATION  05/13/2021   Procedure: FINE NEEDLE ASPIRATION (FNA) LINEAR;  Surgeon: Gloriajean Large, MD;  Location: [facility];  Service: Pulmonary;;   HIP ARTHROPLASTY Left 2018   MOUTH SURGERY  1972   PROCTOSCOPY N/A 08/27/2014   Procedure: PROCTOSCOPY;  Surgeon: Joyce Nixon, MD;  Location: [facility];  Service: General;  Laterality: N/A;   TRANSANAL RECTAL RESECTION N/A 08/27/2014   Procedure: TRANSANAL RESECTION OF RECTAL TUMOR;  Surgeon: Joyce Nixon, MD;  Location: [facility];  Service: General;  Laterality: N/A;   VIDEO BRONCHOSCOPY N/A 05/13/2021   Procedure: VIDEO BRONCHOSCOPY WITHOUT FLUORO;  Surgeon: Gloriajean Large, MD;  Location: [facility];  Service: Pulmonary;  Laterality: N/A;   Current Outpatient Medications on File Prior to Visit  Medication Sig Dispense Refill   albuterol  (VENTOLIN  HFA) 108 (90 Base) MCG/ACT inhaler Inhale 2 puffs into the lungs every 6 (six) hours as needed for wheezing or shortness of breath. 8 g 0   aspirin EC 81 MG tablet Take 81 mg by mouth in the morning. Swallow whole.  EPINEPHrine  0.3 mg/0.3 mL IJ SOAJ injection Inject 0.3 mg into the muscle as needed for anaphylaxis. 2 each 1   fexofenadine  (ALLEGRA ) 180 MG tablet Take 180 mg by mouth daily. [place] Signature Aller-Fex Antihistamine 180 mg     fluticasone  (FLONASE ) 50 MCG/ACT nasal spray Use 2 sprays in each  nostril daily (Patient taking differently: 2 sprays every evening. Use 2 sprays in each  nostril daily) 48 g 3   fluticasone -salmeterol (ADVAIR  HFA) 115-21 MCG/ACT inhaler INHALE 2 PUFFS INTO THE  LUNGS TWICE A DAY 12 each 11   Homeopathic Products (COLD REMEDY PO) Take 2 tablets by mouth every 2 (two) hours as needed (cold symptoms).     Multiple Vitamin (MULTIVITAMIN) tablet Take 1 tablet by mouth daily.     predniSONE  (DELTASONE ) 20 MG tablet 3 tabs poqday 1-2, 2 tabs poqday 3-4, 1 tab poqday 5-6 12 tablet 0   rosuvastatin  (CRESTOR ) 10 MG tablet TAKE 1 TABLET BY MOUTH EVERY DAY 90 tablet 3   tamsulosin  (FLOMAX ) 0.4 MG CAPS capsule Take 0.4 mg by mouth daily.     No current facility-administered medications on file prior to visit.   Allergies  Allergen Reactions   Meat [Alpha-Gal] Anaphylaxis   Nylon Rash   Lotrel [Amlodipine Besy-Benazepril Hcl] Hives and Swelling   Other Swelling    lycra    Chalk Other (See Comments)    sneezing   Social History   Socioeconomic History   Marital status: Married    Spouse name: Not on file   Number of children: Not on file   Years of education: Not on file   Highest education level: Associate degree: academic program  Occupational History   Not on file  Tobacco Use   Smoking status: Never   Smokeless tobacco: Never  Vaping Use   Vaping status: Never Used  Substance and Sexual Activity   Alcohol use: No   Drug use: No   Sexual activity: Not Currently  Other Topics Concern   Not on file  Social History Narrative   Not on file   Social Drivers of Health   Financial Resource Strain: Low Risk  (10/07/2023)   Overall Financial Resource Strain (CARDIA)    Difficulty of Paying Living Expenses: Not hard at all  Food Insecurity: No Food Insecurity (10/07/2023)   Hunger Vital Sign    Worried About Running Out of Food in the Last Year: Never true    Ran Out of Food in the Last Year: Never true  Transportation Needs: No Transportation Needs (10/07/2023)   PRAPARE - Administrator, Civil Service (Medical): No    Lack of Transportation (Non-Medical): No  Physical Activity: Insufficiently Active (10/07/2023)   Exercise Vital  Sign    Days of Exercise per Week: 3 days    Minutes of Exercise per Session: 30 min  Stress: No Stress Concern Present (10/07/2023)   [employer] of Occupational Health - Occupational Stress Questionnaire    Feeling of Stress : Not at all  Social Connections: Socially Integrated (10/07/2023)   Social Connection and Isolation Panel [NHANES]    Frequency of Communication with Friends and Family: More than three times a week    Frequency of Social Gatherings with Friends and Family: Three times a week    Attends Religious Services: More than 4 times per year    Active Member of Clubs or Organizations: Yes    Attends Banker Meetings: More than 4 times per year  Marital Status: Married  Catering manager Violence: Not At Risk (10/07/2023)   Humiliation, Afraid, Rape, and Kick questionnaire    Fear of Current or Ex-Partner: No    Emotionally Abused: No    Physically Abused: No    Sexually Abused: No   Family History  Problem Relation Age of Onset   Thyroid  cancer Mother    Lung cancer Father    Colon cancer Neg Hx       Review of Systems  All other systems reviewed and are negative.      Objective:   Physical Exam Vitals reviewed.  Constitutional:      General: He is not in acute distress.    Appearance: He is well-developed. He is not diaphoretic.  HENT:     Head: Normocephalic and atraumatic.     Right Ear: External ear normal.     Left Ear: External ear normal.     Nose: Nose normal.     Mouth/Throat:     Pharynx: No oropharyngeal exudate.  Eyes:     General: No scleral icterus.       Right eye: No discharge.        Left eye: No discharge.     Conjunctiva/sclera: Conjunctivae normal.     Pupils: Pupils are equal, round, and reactive to light.  Neck:     Thyroid : No thyromegaly.     Vascular: No JVD.     Trachea: No tracheal deviation.  Cardiovascular:     Rate and Rhythm: Normal rate and regular rhythm.     Heart sounds: Normal heart sounds.  No murmur heard.    No friction rub. No gallop.  Pulmonary:     Effort: Pulmonary effort is normal. No respiratory distress.     Breath sounds: Normal breath sounds. No stridor. No wheezing or rales.  Chest:     Chest wall: No tenderness.  Abdominal:     General: Bowel sounds are normal. There is no distension.     Palpations: Abdomen is soft. There is no mass.     Tenderness: There is no abdominal tenderness. There is no guarding or rebound.  Musculoskeletal:        General: No tenderness or deformity. Normal range of motion.     Cervical back: Normal range of motion and neck supple.  Lymphadenopathy:     Cervical: No cervical adenopathy.  Skin:    General: Skin is warm.     Coloration: Skin is not pale.     Findings: No erythema or rash.  Neurological:     Mental Status: He is alert and oriented to person, place, and time.     Cranial Nerves: No cranial nerve deficit.     Motor: No abnormal muscle tone.     Coordination: Coordination normal.     Deep Tendon Reflexes: Reflexes are normal and symmetric.  Psychiatric:        Behavior: Behavior normal.        Thought Content: Thought content normal.        Judgment: Judgment normal.     Patient has acquired dysmorphic toenails on both feet      Assessment & Plan:  Encounter for Medicare annual wellness exam - Plan: CBC with Differential/Platelet, Comprehensive metabolic panel with GFR, Lipid panel, PSA  Pure hypercholesterolemia - Plan: CBC with Differential/Platelet, Comprehensive metabolic panel with GFR, Lipid panel  Sarcoidosis  Atherosclerosis of native coronary artery of native heart without angina pectoris - Plan: CBC with Differential/Platelet,  Comprehensive metabolic panel with GFR, Lipid panel  Prostate cancer screening - Plan: PSA  Right foot pain - Plan: Ambulatory referral to Podiatry Patient received the shingles vaccine today.  Pneumonia vaccines up-to-date.  I recommended a tetanus shot but he would like  to get this at his pharmacy.  Blood pressure is excellent.  Check CBC CMP and lipid panel.  Given his history of coronary artery calcifications in the LAD, I have recommended getting his LDL cholesterol less than 55.  Patient denies any angina or shortness of breath or chest pain.  Screening for prostate cancer with a PSA.  Colonoscopy is due next year.  Consult podiatry for likely tendinitis in the medial ankle

## 2023-10-18 NOTE — Progress Notes (Signed)
 Subjective:   Thomas Livingston is a 73 y.o. male who presents for Medicare Annual/Subsequent preventive examination.  Visit Complete: In person  Patient Medicare AWV questionnaire was completed by the patient on [id number]; I have confirmed that all information answered by patient is correct and no changes since this date.        Objective:     There were no vitals filed for this visit. There is no height or weight on file to calculate BMI.     10/07/2023   11:47 AM 08/13/2022    9:51 AM 08/07/2021    9:52 AM 05/13/2021   10:56 AM 11/27/2020    9:51 PM 09/25/2020    4:40 AM 08/13/2020    6:01 PM  Advanced Directives  Does Patient Have a Medical Advance Directive? Yes Yes Yes Yes No No No  Type of Advance Directive Healthcare Power of Attorney Living will;Healthcare Power of [employer] Power of [place];Living will Healthcare Power of [place];Living will     Does patient want to make changes to medical advance directive?  No - Patient declined       Copy of Healthcare Power of Attorney in Chart? No - copy requested No - copy requested No - copy requested No - copy requested     Would patient like information on creating a medical advance directive?   No - Patient declined   No - Patient declined     Current Medications (verified) Outpatient Encounter Medications as of 10/18/2023  Medication Sig   albuterol  (VENTOLIN  HFA) 108 (90 Base) MCG/ACT inhaler Inhale 2 puffs into the lungs every 6 (six) hours as needed for wheezing or shortness of breath.   aspirin EC 81 MG tablet Take 81 mg by mouth in the morning. Swallow whole.   EPINEPHrine  0.3 mg/0.3 mL IJ SOAJ injection Inject 0.3 mg into the muscle as needed for anaphylaxis.   fexofenadine  (ALLEGRA ) 180 MG tablet Take 180 mg by mouth daily. [place] Signature Aller-Fex Antihistamine 180 mg   fluticasone  (FLONASE ) 50 MCG/ACT nasal spray Use 2 sprays in each  nostril daily (Patient taking differently: 2 sprays every evening. Use 2  sprays in each  nostril daily)   fluticasone -salmeterol (ADVAIR  HFA) 115-21 MCG/ACT inhaler INHALE 2 PUFFS INTO THE LUNGS TWICE A DAY   Homeopathic Products (COLD REMEDY PO) Take 2 tablets by mouth every 2 (two) hours as needed (cold symptoms).   Multiple Vitamin (MULTIVITAMIN) tablet Take 1 tablet by mouth daily.   predniSONE  (DELTASONE ) 20 MG tablet 3 tabs poqday 1-2, 2 tabs poqday 3-4, 1 tab poqday 5-6   rosuvastatin  (CRESTOR ) 10 MG tablet TAKE 1 TABLET BY MOUTH EVERY DAY   tamsulosin  (FLOMAX ) 0.4 MG CAPS capsule Take 0.4 mg by mouth daily.   No facility-administered encounter medications on file as of 10/18/2023.    Allergies (verified) Meat [alpha-gal], Nylon, Lotrel [amlodipine besy-benazepril hcl], Other, and Chalk   History: Past Medical History:  Diagnosis Date   Allergy    Arthritis    left hip   Asthma    History of kidney stones    Hyperlipidemia    Hypertension 2012   no bp meds since 2012   Multiple lung nodules on CT 2007   no current follow up done   Psoriasis    both legs   Urticaria    Urticaria 03/31/2018   Past Surgical History:  Procedure Laterality Date   BRONCHIAL WASHINGS  05/13/2021   Procedure: BRONCHIAL WASHINGS;  Surgeon: Arline Laity  Melven Stable, MD;  Location: [facility];  Service: Pulmonary;;   CHOLECYSTECTOMY  2007   ENDOBRONCHIAL ULTRASOUND N/A 05/13/2021   Procedure: ENDOBRONCHIAL ULTRASOUND;  Surgeon: Gloriajean Large, MD;  Location: [facility];  Service: Pulmonary;  Laterality: N/A;   EUS N/A 06/28/2014   Procedure: LOWER ENDOSCOPIC ULTRASOUND (EUS);  Surgeon: Janel Medford, MD;  Location: [facility];  Service: Endoscopy;  Laterality: N/A;   EXTRACORPOREAL SHOCK WAVE LITHOTRIPSY N/A 03/21/2018   Procedure: EXTRACORPOREAL SHOCK WAVE LITHOTRIPSY (ESWL);  Surgeon: Homero Luster, MD;  Location: [facility];  Service: Urology;  Laterality: N/A;   FINE NEEDLE ASPIRATION  05/13/2021   Procedure: FINE NEEDLE ASPIRATION (FNA) LINEAR;  Surgeon: Gloriajean Large, MD;  Location: [facility];  Service: Pulmonary;;   HIP ARTHROPLASTY Left 2018   MOUTH SURGERY  1972   PROCTOSCOPY N/A 08/27/2014   Procedure: PROCTOSCOPY;  Surgeon: Joyce Nixon, MD;  Location: [facility];  Service: General;  Laterality: N/A;   TRANSANAL RECTAL RESECTION N/A 08/27/2014   Procedure: TRANSANAL RESECTION OF RECTAL TUMOR;  Surgeon: Joyce Nixon, MD;  Location: [facility];  Service: General;  Laterality: N/A;   VIDEO BRONCHOSCOPY N/A 05/13/2021   Procedure: VIDEO BRONCHOSCOPY WITHOUT FLUORO;  Surgeon: Gloriajean Large, MD;  Location: [facility];  Service: Pulmonary;  Laterality: N/A;   Family History  Problem Relation Age of Onset   Thyroid  cancer Mother    Lung cancer Father    Colon cancer Neg Hx    Social History   Socioeconomic History   Marital status: Married    Spouse name: Not on file   Number of children: Not on file   Years of education: Not on file   Highest education level: Associate degree: academic program  Occupational History   Not on file  Tobacco Use   Smoking status: Never   Smokeless tobacco: Never  Vaping Use   Vaping status: Never Used  Substance and Sexual Activity   Alcohol use: No   Drug use: No   Sexual activity: Not Currently  Other Topics Concern   Not on file  Social History Narrative   Not on file   Social Drivers of Health   Financial Resource Strain: Low Risk  (10/07/2023)   Overall Financial Resource Strain (CARDIA)    Difficulty of Paying Living Expenses: Not hard at all  Food Insecurity: No Food Insecurity (10/07/2023)   Hunger Vital Sign    Worried About Running Out of Food in the Last Year: Never true    Ran Out of Food in the Last Year: Never true  Transportation Needs: No Transportation Needs (10/07/2023)   PRAPARE - Administrator, Civil Service (Medical): No    Lack of Transportation (Non-Medical): No  Physical Activity: Insufficiently Active (10/07/2023)   Exercise Vital Sign    Days of  Exercise per Week: 3 days    Minutes of Exercise per Session: 30 min  Stress: No Stress Concern Present (10/07/2023)   [employer] of Occupational Health - Occupational Stress Questionnaire    Feeling of Stress : Not at all  Social Connections: Socially Integrated (10/07/2023)   Social Connection and Isolation Panel [NHANES]    Frequency of Communication with Friends and Family: More than three times a week    Frequency of Social Gatherings with Friends and Family: Three times a week    Attends Religious Services: More than 4 times per year    Active Member of Clubs or Organizations: Yes  Attends Banker Meetings: More than 4 times per year    Marital Status: Married    Tobacco Counseling Counseling given: Not Answered   Clinical Intake:                        Activities of Daily Living    10/07/2023   11:52 AM  In your present state of health, do you have any difficulty performing the following activities:  Hearing? 1  Vision? 0  Difficulty concentrating or making decisions? 0  Walking or climbing stairs? 0  Dressing or bathing? 0  Doing errands, shopping? 0  Preparing Food and eating ? N  Using the Toilet? N  In the past six months, have you accidently leaked urine? N  Do you have problems with loss of bowel control? N  Managing your Medications? N  Managing your Finances? N  Housekeeping or managing your Housekeeping? N    Patient Care Team: Austine Lefort, MD as PCP - General (Family Medicine) [facility], [facility] Gloriajean Large, MD as Consulting Physician (Pulmonary Disease)  Indicate any recent Medical Services you may have received from other than [facility] providers in the past year (date may be approximate).     Assessment:    This is a routine wellness examination for [place].  Hearing/Vision screen No results found.   Goals Addressed   None    Depression Screen    10/07/2023   11:54 AM 10/01/2022     3:38 PM 09/10/2022    9:43 AM 08/13/2022    9:50 AM 12/30/2021   12:14 PM 08/07/2021    9:50 AM 07/31/2020    8:29 AM  PHQ 2/9 Scores  PHQ - 2 Score 0 0 0 0 0 0 0  PHQ- 9 Score 4          Fall Risk    10/07/2023   11:43 AM 10/01/2022    3:38 PM 09/10/2022    9:43 AM 08/13/2022    9:50 AM 08/10/2022    4:57 PM  Fall Risk   Falls in the past year? 0 0 0 0 0  Number falls in past yr: 0 0 0 0 0  Injury with Fall? 0 0 0 0 0  Risk for fall due to :  No Fall Risks No Fall Risks No Fall Risks   Follow up Falls evaluation completed;Education provided;Falls prevention discussed Falls prevention discussed Falls prevention discussed Falls prevention discussed;Education provided;Falls evaluation completed     MEDICARE RISK AT HOME:    TIMED UP AND GO:  Was the test performed?  Yes  Length of time to ambulate 10 feet: 10 sec Gait steady and fast with assistive device    Cognitive Function:        10/07/2023   11:49 AM 08/13/2022    9:51 AM  6CIT Screen  What Year? 0 points 0 points  What month? 0 points 0 points  What time? 0 points 0 points  Count back from 20 0 points 0 points  Months in reverse 0 points 0 points  Repeat phrase 0 points 0 points  Total Score 0 points 0 points    Immunizations Immunization History  Administered Date(s) Administered   Fluad Quad(high Dose 65+) 05/18/2019   Influenza, High Dose Seasonal PF 03/01/2018   Influenza,inj,Quad PF,6+ Mos 02/27/2014, 06/25/2015, 02/20/2016   Influenza-Unspecified 03/02/2017   PFIZER(Purple Top)SARS-COV-2 Vaccination 07/14/2019, 08/09/2019   PNEUMOCOCCAL CONJUGATE-20 06/04/2021   Pneumococcal Conjugate-13  03/17/2018   Tdap 11/14/2009, 12/16/2013   Zoster, Live 02/27/2014    TDAP status: Up to date  Flu Vaccine status: Up to date  Pneumococcal vaccine status: Up to date  Covid-19 vaccine status: Completed vaccines  Qualifies for Shingles Vaccine? yes  Zostavax completed No   Shingrix Completed?: Yes  Screening  Tests Health Maintenance  Topic Date Due   Zoster Vaccines- Shingrix (1 of 2) 11/17/2002   COVID-19 Vaccine (3 - 2024-25 season) 10/23/2023 (Originally 01/10/2023)   INFLUENZA VACCINE  12/10/2023   DTaP/Tdap/Td (3 - Td or Tdap) 12/17/2023   Colonoscopy  06/11/2024   Medicare Annual Wellness (AWV)  10/17/2024   Pneumonia Vaccine 67+ Years old  Completed   Hepatitis C Screening  Completed   HPV VACCINES  Aged Out   Meningococcal B Vaccine  Aged Out    Health Maintenance  Health Maintenance Due  Topic Date Due   Zoster Vaccines- Shingrix (1 of 2) 11/17/2002    Colorectal cancer screening: Type of screening: Colonoscopy. Completed 06/11/2014. Repeat every 10 years  Lung Cancer Screening: (Low Dose CT Chest recommended if Age 68-80 years, 20 pack-year currently smoking OR have quit w/in 15years.) does not qualify.   Lung Cancer Screening Referral: no  Additional Screening:  Hepatitis C Screening: does not qualify; Completed 06/25/2015  Vision Screening: Recommended annual ophthalmology exams for early detection of glaucoma and other disorders of the eye. Is the patient up to date with their annual eye exam?  Yes  Who is the provider or what is the name of the office in which the patient attends annual eye exams? Sees different practitioner  If pt is not established with a provider, would they like to be referred to a provider to establish care? No .   Dental Screening: Recommended annual dental exams for proper oral hygiene  Diabetic Foot Exam: Diabetic Foot Exam: Completed n/a  Community Resource Referral / Chronic Care Management: CRR required this visit?  No   CCM required this visit?  No     Plan:     I have personally reviewed and noted the following in the patient's chart:   Medical and social history Use of alcohol, tobacco or illicit drugs  Current medications and supplements including opioid prescriptions. Patient is not currently taking opioid  prescriptions. Functional ability and status Nutritional status Physical activity Advanced directives List of other physicians Hospitalizations, surgeries, and ER visits in previous 12 months Vitals Screenings to include cognitive, depression, and falls Referrals and appointments  In addition, I have reviewed and discussed with patient certain preventive protocols, quality metrics, and best practice recommendations. A written personalized care plan for preventive services as well as general preventive health recommendations were provided to patient.     Meriam Stamp, CMA   10/18/2023   After Visit Summary: (In Person-Printed) AVS printed and given to the patient  Nurse Notes: pt. Answered al questioning in favorable cognitive manner

## 2023-10-19 ENCOUNTER — Ambulatory Visit (INDEPENDENT_AMBULATORY_CARE_PROVIDER_SITE_OTHER)

## 2023-10-19 ENCOUNTER — Encounter: Payer: Self-pay | Admitting: Podiatry

## 2023-10-19 ENCOUNTER — Ambulatory Visit: Admitting: Podiatry

## 2023-10-19 ENCOUNTER — Ambulatory Visit: Payer: Self-pay | Admitting: Family Medicine

## 2023-10-19 ENCOUNTER — Other Ambulatory Visit: Payer: Self-pay

## 2023-10-19 DIAGNOSIS — M67873 Other specified disorders of tendon, right ankle and foot: Secondary | ICD-10-CM | POA: Diagnosis not present

## 2023-10-19 DIAGNOSIS — G5751 Tarsal tunnel syndrome, right lower limb: Secondary | ICD-10-CM

## 2023-10-19 LAB — COMPREHENSIVE METABOLIC PANEL WITH GFR
AG Ratio: 1.8 (calc) (ref 1.0–2.5)
ALT: 21 U/L (ref 9–46)
AST: 22 U/L (ref 10–35)
Albumin: 4 g/dL (ref 3.6–5.1)
Alkaline phosphatase (APISO): 77 U/L (ref 35–144)
BUN: 10 mg/dL (ref 7–25)
CO2: 29 mmol/L (ref 20–32)
Calcium: 9 mg/dL (ref 8.6–10.3)
Chloride: 102 mmol/L (ref 98–110)
Creat: 0.8 mg/dL (ref 0.70–1.28)
Globulin: 2.2 g/dL (ref 1.9–3.7)
Glucose, Bld: 72 mg/dL (ref 65–99)
Potassium: 4.4 mmol/L (ref 3.5–5.3)
Sodium: 138 mmol/L (ref 135–146)
Total Bilirubin: 0.5 mg/dL (ref 0.2–1.2)
Total Protein: 6.2 g/dL (ref 6.1–8.1)
eGFR: 95 mL/min/{1.73_m2} (ref >=60)

## 2023-10-19 LAB — CBC WITH DIFFERENTIAL/PLATELET
Absolute Lymphocytes: 798 {cells}/uL — ABNORMAL LOW (ref 850–3900)
Absolute Monocytes: 861 {cells}/uL (ref 200–950)
Basophils Absolute: 49 {cells}/uL (ref 0–200)
Basophils Relative: 0.7 %
Eosinophils Absolute: 658 {cells}/uL — ABNORMAL HIGH (ref 15–500)
Eosinophils Relative: 9.4 %
HCT: 45 % (ref 38.5–50.0)
Hemoglobin: 14.8 g/dL (ref 13.2–17.1)
MCH: 31.6 pg (ref 27.0–33.0)
MCHC: 32.9 g/dL (ref 32.0–36.0)
MCV: 95.9 fL (ref 80.0–100.0)
MPV: 11 fL (ref 7.5–12.5)
Monocytes Relative: 12.3 %
Neutro Abs: 4634 {cells}/uL (ref 1500–7800)
Neutrophils Relative %: 66.2 %
Platelets: 227 10*3/uL (ref 140–400)
RBC: 4.69 10*6/uL (ref 4.20–5.80)
RDW: 12.2 % (ref 11.0–15.0)
Total Lymphocyte: 11.4 %
WBC: 7 10*3/uL (ref 3.8–10.8)

## 2023-10-19 LAB — LIPID PANEL
Cholesterol: 140 mg/dL (ref <200)
HDL: 42 mg/dL (ref >=40)
LDL Cholesterol (Calc): 75 mg/dL
Non-HDL Cholesterol (Calc): 98 mg/dL (ref <130)
Total CHOL/HDL Ratio: 3.3 (calc) (ref <5.0)
Triglycerides: 150 mg/dL — ABNORMAL HIGH (ref <150)

## 2023-10-19 LAB — PSA: PSA: 1.03 ng/mL (ref <=4.00)

## 2023-10-19 MED ORDER — ROSUVASTATIN CALCIUM 20 MG PO TABS: 20.0000 mg | ORAL_TABLET | Freq: Every day | ORAL | 3 refills | Status: AC

## 2023-10-19 MED ORDER — MELOXICAM 15 MG PO TABS: 15.0000 mg | ORAL_TABLET | Freq: Every day | ORAL | 0 refills | Status: DC

## 2023-10-19 NOTE — Patient Instructions (Signed)
 Tarsal Tunnel Syndrome Rehab Ask your health care provider which exercises are safe for you. Do exercises exactly as told by your provider and adjust them as told. It is normal to feel mild stretching, pulling, tightness, or discomfort as you do these exercises. Stop right away if you feel sudden pain or your pain gets worse. Do not begin these exercises until told by your provider. Stretching and range-of-motion exercises These exercises warm up your muscles and joints and improve the movement and flexibility of your foot. These exercises also help to relieve pain, numbness, and tingling. Gastrocnemius stretch, standing This exercise is also called a calf stretch. It stretches the muscles in the back of the lower leg (gastrocnemius). Stand with your hands against a wall. Extend your left / right leg behind you, and bend your front knee slightly. Your heels should be on the floor. Keeping your heels on the floor and your back knee straight, shift your weight toward the wall. Do not arch your back. You should feel a gentle stretch in the back of your lower leg (calf). Hold this position for __________ seconds. Return to the starting position. Repeat __________ times. Complete this exercise __________ times a day. Tibial nerve glide  Sit on a stable chair with both feet on the floor. Clasp your hands together behind your back. Gently round your back and tuck your chin toward your chest. Slowly straighten your knee as far as you can without increasing your symptoms. Turn your left / right foot so that your toes are pointing outward. Slowly tip your toes toward your shin. Hold this position for __________ seconds. Slowly return to the starting position. Repeat __________ times. Complete this exercise __________ times a day. Strengthening exercises These exercises build strength and endurance in your foot. Endurance is the ability to use your muscles for a long time, even after they get  tired. Plantar flexion with band  Sit on the floor with your left / right leg extended. Loop a rubber exercise band or tube around the ball of your left / right foot. The ball of your foot is on the walking surface, right under your toes. The band or tube should be slightly tense when your foot is relaxed. Hold the two ends of the band or tube in your hands. Slowly point your toes downward, pushing them away from you (plantar flexion). Stop pushing your toes down if you have any pain. Hold this position for __________ seconds. Let the band or tube slowly pull your foot back to the starting position. Repeat __________ times. Complete this exercise __________ times a day. Ankle inversion with band  Secure one end of an exercise band or tubing to a fixed object, such as a table leg or a pole, that will stay still when the band is pulled. Secure the other end of the band around your left / right foot, near your toes. Sit on the floor, facing the fixed object. The band should be slightly tense when your foot is relaxed. Leading with your big toe, slowly pull your banded foot inward, toward your body (inversion). The band or tube should be adding resistance. Hold this position for __________ seconds. Let the band or tube slowly pull your foot back to the starting position. Repeat __________ times. Complete this exercise __________ times a day. Arch lifts This exercise strengthens the main muscles of your foot (foot intrinsics). Sit in a chair with your feet flat on the floor. Keeping your big toe and your heel  on the floor, lift only your arch, which is on the inner edge of your left / right foot. Do not move your knee or scrunch your toes. This is a small movement. Hold this position for __________ seconds. Slowly return to the starting position. Repeat __________ times. Complete this exercise __________ times a day. This information is not intended to replace advice given to you by your health  care provider. Make sure you discuss any questions you have with your health care provider. Document Revised: 05/12/2022 Document Reviewed: 05/12/2022 Elsevier Patient Education  2024 [employer].

## 2023-10-19 NOTE — Progress Notes (Signed)
  Subjective:  Patient ID: Thomas Livingston, male    DOB: April 23, 1951,   MRN: [id number]  Chief Complaint  Patient presents with   Foot Pain    Patient states he has pain on his medial side of his right foot, when he puts pressure on his medial side of his Achillies it hurts badly the patient. Patient states he has not been taking medication for pain     73 y.o. male presents for new concern of pain on the inside of his right ankle relates there is a certain spot that sends shocks up his leg. States he noticed it several months ago when his other foot would pump this spot. He denies any current treatments but does wear supportive inserts from previous plantar fasciitis.  . Denies any other pedal complaints. Denies n/v/f/c.   Past Medical History:  Diagnosis Date   Allergy    Arthritis    left hip   Asthma    Cataract 2023   Found by optometrist   History of kidney stones    Hyperlipidemia    Hypertension 2012   no bp meds since 2012   Multiple lung nodules on CT 2007   no current follow up done   Psoriasis    both legs   Urticaria    Urticaria 03/31/2018    Objective:  Physical Exam: Vascular: DP/PT pulses 2/4 bilateral. CFT <3 seconds. Normal hair growth on digits. No edema.  Skin. No lacerations or abrasions bilateral feet.  Musculoskeletal: MMT 5/5 bilateral lower extremities in DF, PF, Inversion and Eversion. Deceased ROM in DF of ankle joint. Tender to medial right ankle in area of tarsal tunnel very sensitive to touch. No pain with Eversion inversion PF or DF of the foot. No pain with heel lift and inversion of the heel.  Neurological: Sensation intact to light touch. Positive tinel sign on right.   Assessment:   1. Tarsal tunnel syndrome, right      Plan:  Patient was evaluated and treated and all questions answered. X-rays reviewed and discussed with patient. No acute fractures or dislocations some collapse of medial arch and pes planus noted.  Discussed tarsal  tunnel syndrome and treatment options at length with patient Discussed stretching exercises and provided handout. Prescription for meloxicam provided. CMP reviewed and kidney function wnl.  Dispensed Tri-Lock ankle brace. Discussed that if the symptoms do not improve can consider PT/MRI. Patient to return in 6 to 8 weeks or sooner if symptoms fail to improve or worsen.   Jennefer Moats, DPM

## 2023-10-20 ENCOUNTER — Telehealth: Payer: Self-pay

## 2023-10-20 NOTE — Telephone Encounter (Signed)
 Pt stopped by and wanted a clinical staff member to take a look at his injection site where he had the shingles vaccine. I took a look at the injection site. It is a red, raised lump on the left deltoid with bruising to the left deltoid as well. I offered him an appt today with Dr Ferna How but he denied. I advised him to alternate heat and ice and to please let us  know how he is doing tomorrow. I advised him to seek emergency care if he gets a fever, if the injection site feels warm to the touch or if he experiences any chest pain or shortness of breath.

## 2023-10-28 ENCOUNTER — Ambulatory Visit: Admitting: Podiatry

## 2023-11-03 ENCOUNTER — Telehealth: Payer: Self-pay | Admitting: Family Medicine

## 2023-11-03 NOTE — Telephone Encounter (Signed)
 Copied from CRM [id number]. Topic: General - Call Back - No Documentation >> Nov 02, 2023  4:19 PM Thomas Livingston wrote: Reason for CRM: patient got a shingles shot that was not covered by the insurance and was wondering if the next shot would cost him anything. Please give patient a callback [phone]

## 2023-11-11 ENCOUNTER — Telehealth: Payer: Self-pay | Admitting: Family Medicine

## 2023-11-11 NOTE — Telephone Encounter (Signed)
 Copied from CRM [id number]. Topic: Complaint (DO NOT CONVERT) - Billing/Coding >> Nov 11, 2023  4:16 PM Tiffini S wrote: DOS: 10/07/23 and 10/18/23 Details of complaint: Patient have been billed by [facility] for one of the two visits and will not be covered by insurance.  How would the patient like to see this issue resolved? Patient states that this is a billing/ code issue for the telephone visit/ phone call should not be counted as a Annual Wellness Visit/ patient states that he was not informed- double / mis coding - patient states that he would have not agreed to the telephone visit just for questions Asked for a call back at [phone]   Route to Research officer, political party.

## 2023-11-17 ENCOUNTER — Telehealth: Payer: Self-pay | Admitting: Family Medicine

## 2023-11-17 NOTE — Telephone Encounter (Signed)
 Copied from CRM [id number]. Topic: General - Billing Inquiry >> Nov 15, 2023  3:55 PM Essie A wrote: Reason for CRM: Patient called again regarding CRM previously sent with a billing issue.  I asked him to wait a couple more days to get a response.

## 2023-11-23 DIAGNOSIS — L821 Other seborrheic keratosis: Secondary | ICD-10-CM | POA: Diagnosis not present

## 2023-11-23 DIAGNOSIS — L570 Actinic keratosis: Secondary | ICD-10-CM | POA: Diagnosis not present

## 2023-11-23 DIAGNOSIS — Z85828 Personal history of other malignant neoplasm of skin: Secondary | ICD-10-CM | POA: Diagnosis not present

## 2023-11-30 ENCOUNTER — Encounter: Payer: Self-pay | Admitting: Podiatry

## 2023-11-30 ENCOUNTER — Ambulatory Visit (INDEPENDENT_AMBULATORY_CARE_PROVIDER_SITE_OTHER): Admitting: Podiatry

## 2023-11-30 DIAGNOSIS — G5751 Tarsal tunnel syndrome, right lower limb: Secondary | ICD-10-CM | POA: Diagnosis not present

## 2023-11-30 MED ORDER — MELOXICAM 15 MG PO TABS: 15.0000 mg | ORAL_TABLET | Freq: Every day | ORAL | 0 refills | Status: DC

## 2023-11-30 NOTE — Patient Instructions (Signed)
Posterior Tibial Tendinitis Rehab Ask your health care provider which exercises are safe for you. Do exercises exactly as told by your provider and adjust them as directed. It's normal to feel mild stretching, pulling, tightness, or discomfort as you do these exercises. Stop right away if you feel sudden pain or your pain gets worse. Do not begin these exercises until told by your provider. Stretching and range-of-motion exercises These exercises warm up your muscles and joints and improve the movement and flexibility in your ankle and foot. These exercises may also help to relieve pain. Standing wall calf stretch, knee straight  Stand with your hands against a wall. Extend your left / right leg behind you, and bend your front knee slightly. If told, place a folded washcloth under the arch of your foot for support. Point the toes of your back foot slightly inward. Keeping your heels on the floor and your back knee straight, shift your weight toward the wall. Do not allow your back to arch. You should feel a gentle stretch in your upper left / right calf. Hold this position for __________ seconds. Repeat __________ times. Complete this exercise __________ times a day. Standing wall calf stretch, knee bent  Stand with your hands against a wall. Extend your left / right leg behind you, and bend your front knee slightly. If told, place a folded washcloth under the arch of your foot for support. Point the toes of your back foot slightly inward. Unlock your back knee so it's bent. Keep your heels on the floor. You should feel a gentle stretch deep in your lower left / right calf. Hold this position for __________ seconds. Repeat __________ times. Complete this exercise __________ times a day. Strengthening exercises These exercises build strength and endurance in your ankle and foot. Endurance is the ability to use your muscles for a long time, even after they get tired. Ankle inversion with  band  Secure one end of a rubber exercise band or tubing to a fixed object, such as a table leg or a pole, that will stay still when the band is pulled. Loop the other end of the band around the middle of your left / right foot. Sit on the floor facing the object with your left / right leg extended. The band or tube should be slightly tense when your foot is relaxed. Leading with your big toe, slowly bring your left / right foot and ankle inward, toward your other foot (inversion). Hold this position for __________ seconds. Slowly return your foot to the starting position. Repeat __________ times. Complete this exercise __________ times a day. Towel curls  Sit in a chair on a non-carpeted surface, and put your feet on the floor. Place a towel in front of your feet. If told by your provider, add a __________ weight to the end of the towel. Keeping your heel on the floor, put your left / right foot on the towel. Pull the towel toward you by grabbing the towel with your toes and curling them under. Keep your heel on the floor while you do this. Let your toes relax. Grab the towel with your toes again. Keep going until the towel is completely underneath your foot. Repeat __________ times. Complete this exercise __________ times a day. Balance exercise This exercise improves or maintains your balance. Balance is important in preventing falls. Single leg stand  Without wearing shoes, stand near a railing or in a doorway. You may hold on to the railing or   doorframe as needed for balance. Stand on your left / right foot. Keep your big toe down on the floor and try to keep your arch lifted. If balancing in this position is too easy, try the exercise with your eyes closed or while standing on a pillow. Hold this position for __________ seconds. Repeat __________ times. Complete this exercise __________ times a day. This information is not intended to replace advice given to you by your health care  provider. Make sure you discuss any questions you have with your health care provider. Document Revised: 04/29/2022 Document Reviewed: 04/29/2022 Elsevier Patient Education  2024 [employer]  

## 2023-11-30 NOTE — Progress Notes (Signed)
  Subjective:  Patient ID: Thomas Livingston, male    DOB: April 27, 1951,   MRN: [id number]  Chief Complaint  Patient presents with   Foot Pain    It's somewhat better.  It's not as painful.    73 y.o. male presents for follow up of right tarsal tunnel syndrome . Does relate not as painful and is better. Still getting some pain.   . Denies any other pedal complaints. Denies n/v/f/c.   Past Medical History:  Diagnosis Date   Allergy    Arthritis    left hip   Asthma    Cataract 2023   Found by optometrist   History of kidney stones    Hyperlipidemia    Hypertension 2012   no bp meds since 2012   Multiple lung nodules on CT 2007   no current follow up done   Psoriasis    both legs   Urticaria    Urticaria 03/31/2018    Objective:  Physical Exam: Vascular: DP/PT pulses 2/4 bilateral. CFT <3 seconds. Normal hair growth on digits. No edema.  Skin. No lacerations or abrasions bilateral feet.  Musculoskeletal: MMT 5/5 bilateral lower extremities in DF, PF, Inversion and Eversion. Deceased ROM in DF of ankle joint. Tender to medial right ankle in area of tarsal tunnel very sensitive to touch. No pain with Eversion inversion PF or DF of the foot. No pain with heel lift and inversion of the heel.  Neurological: Sensation intact to light touch. Positive tinel sign on right.   Assessment:   1. Tarsal tunnel syndrome, right       Plan:  Patient was evaluated and treated and all questions answered. X-rays reviewed and discussed with patient. No acute fractures or dislocations some collapse of medial arch and pes planus noted.  Discussed tarsal tunnel syndrome and treatment options at length with patient Will continue stretching.  Prescription for meloxicam  refilled CMP reviewed and kidney function wnl.  Continue brace and support.  Discussed that if the symptoms do not improve can consider PT/MRI. Patient to return in 6 to 8 weeks or sooner if symptoms fail to improve or  worsen.   Asberry Failing, DPM

## 2023-12-28 ENCOUNTER — Ambulatory Visit (INDEPENDENT_AMBULATORY_CARE_PROVIDER_SITE_OTHER)

## 2023-12-28 VITALS — Wt 233.0 lb

## 2023-12-28 DIAGNOSIS — Z23 Encounter for immunization: Secondary | ICD-10-CM | POA: Diagnosis not present

## 2023-12-28 NOTE — Progress Notes (Signed)
 Patient is in office today for a nurse visit for Immunization. Patient Injection was given in the  Left deltoid. Patient tolerated injection well.

## 2023-12-29 ENCOUNTER — Other Ambulatory Visit: Payer: Self-pay | Admitting: Podiatry

## 2024-01-27 ENCOUNTER — Encounter (HOSPITAL_BASED_OUTPATIENT_CLINIC_OR_DEPARTMENT_OTHER): Payer: Self-pay | Admitting: Pulmonary Disease

## 2024-01-27 ENCOUNTER — Ambulatory Visit (HOSPITAL_BASED_OUTPATIENT_CLINIC_OR_DEPARTMENT_OTHER): Admitting: Pulmonary Disease

## 2024-01-27 ENCOUNTER — Ambulatory Visit: Admitting: Pulmonary Disease

## 2024-01-27 VITALS — BP 120/63 | HR 58 | Ht 74.0 in | Wt 234.0 lb

## 2024-01-27 DIAGNOSIS — J4530 Mild persistent asthma, uncomplicated: Secondary | ICD-10-CM

## 2024-01-27 DIAGNOSIS — Z91014 Allergy to mammalian meats: Secondary | ICD-10-CM | POA: Diagnosis not present

## 2024-01-27 DIAGNOSIS — D860 Sarcoidosis of lung: Secondary | ICD-10-CM | POA: Diagnosis not present

## 2024-01-27 LAB — PULMONARY FUNCTION TEST
DL/VA % pred: 87 %
DL/VA: 3.45 ml/min/mmHg/L
DLCO unc % pred: 69 %
DLCO unc: 20.14 ml/min/mmHg
FEF 25-75 Post: 2.32 L/s
FEF 25-75 Pre: 1.77 L/s
FEF2575-%Change-Post: 30 %
FEF2575-%Pred-Post: 82 %
FEF2575-%Pred-Pre: 63 %
FEV1-%Change-Post: 10 %
FEV1-%Pred-Post: 77 %
FEV1-%Pred-Pre: 70 %
FEV1-Post: 2.91 L
FEV1-Pre: 2.64 L
FEV1FVC-%Change-Post: 7 %
FEV1FVC-%Pred-Pre: 92 %
FEV6-%Change-Post: 1 %
FEV6-%Pred-Post: 82 %
FEV6-%Pred-Pre: 81 %
FEV6-Post: 3.98 L
FEV6-Pre: 3.91 L
FEV6FVC-%Pred-Post: 105 %
FEV6FVC-%Pred-Pre: 105 %
FVC-%Change-Post: 2 %
FVC-%Pred-Post: 78 %
FVC-%Pred-Pre: 76 %
FVC-Post: 4 L
FVC-Pre: 3.91 L
Post FEV1/FVC ratio: 73 %
Post FEV6/FVC ratio: 100 %
Pre FEV1/FVC ratio: 68 %
Pre FEV6/FVC Ratio: 100 %
RV % pred: 103 %
RV: 2.78 L
TLC % pred: 87 %
TLC: 6.83 L

## 2024-01-27 MED ORDER — FLUTICASONE-SALMETEROL 115-21 MCG/ACT IN AERO: 2.0000 | INHALATION_SPRAY | Freq: Two times a day (BID) | RESPIRATORY_TRACT | 11 refills | Status: AC

## 2024-01-27 NOTE — Patient Instructions (Signed)
 Full PFT performed today.

## 2024-01-27 NOTE — Patient Instructions (Signed)
 Mild persistent asthma --CONTINUE Advair  115-21 TWO puffs in the morning and evening. Rinse mouth after use --CONTINUE Albuterol  AS NEEDED for shortness of breath or wheeze --Reviewed pulmonary function test. Normal with borderline reduced DLCO

## 2024-01-27 NOTE — Progress Notes (Addendum)
 Subjective:   PATIENT ID: Thomas Livingston Side GENDER: male DOB: 1951-02-08, MRN: [id number]  Chief Complaint  Patient presents with   Follow-up    Asthma     Reason for Visit: Follow-up  [name] is a 73 year old male never smoker with asthma, sarcoid, alpha gal allergy, pulmonary nodules, HTN who presents for follow-up. Previously followed by Dr. Gladis.  Initial consult He is on inhalers for his asthma. Recent telephone visit regarding inhaler pricing. He plans to pay off deductible. Feels Advair  HFA does well. Denies shortness of breath, cough, wheezing. Rarely uses albuterol  unless he is sick. Last exacerbation >1 years ago but did have influenza A two months ago with fever and body aches. May be triggered by dust, chicken feathers. 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 With his alpha gal he eats primarily poultry and no longer eats red meat. Interested in more information in his condition.  01/27/24 He had PFTs completed today which are overall normal with borderline DLCO and partial bronchodilator response but not significant. He will take 3-4 days out of the week. At baseline he does shortness of breath, cough or wheezing. No longer exposed to chicken coops and if he is, he will wear heavy duty filter masks. No exacerbations since our last visit.  Social History: Retired Psychiatric nurse with pulmonary fibrosis   Past Medical History:  Diagnosis Date   Allergy    Arthritis    left hip   Asthma    Cataract 2023   Found by optometrist   History of kidney stones    Hyperlipidemia    Hypertension 2012   no bp meds since 2012   Multiple lung nodules on CT 2007   no current follow up done   Psoriasis    both legs   Urticaria    Urticaria 03/31/2018     Family History  Problem Relation Age of Onset   Thyroid  cancer Mother    Lung cancer Father    Cancer Father    Colon cancer Neg Hx      Social History   Occupational History    Not on file  Tobacco Use   Smoking status: Never   Smokeless tobacco: Never  Vaping Use   Vaping status: Never Used  Substance and Sexual Activity   Alcohol use: Never   Drug use: Never   Sexual activity: Yes    Birth control/protection: Other-see comments    Comment: Vasectimy    Allergies  Allergen Reactions   Meat [Alpha-Gal] Anaphylaxis   Nylon Rash   Lotrel [Amlodipine Besy-Benazepril Hcl] Hives and Swelling   Other Swelling    lycra    Chalk Other (See Comments)    sneezing     Outpatient Medications Prior to Visit  Medication Sig Dispense Refill   albuterol  (VENTOLIN  HFA) 108 (90 Base) MCG/ACT inhaler Inhale 2 puffs into the lungs every 6 (six) hours as needed for wheezing or shortness of breath. 8 g 0   aspirin EC 81 MG tablet Take 81 mg by mouth in the morning. Swallow whole.     EPINEPHrine  0.3 mg/0.3 mL IJ SOAJ injection Inject 0.3 mg into the muscle as needed for anaphylaxis. 2 each 1   fexofenadine  (ALLEGRA ) 180 MG tablet Take 180 mg by mouth daily. [facility] Signature Aller-Fex Antihistamine 180 mg     fluticasone  (FLONASE ) 50 MCG/ACT nasal spray Use 2 sprays in each  nostril daily (Patient taking differently: 2 sprays  every evening. Use 2 sprays in each  nostril daily) 48 g 3   Homeopathic Products (COLD REMEDY PO) Take 2 tablets by mouth every 2 (two) hours as needed (cold symptoms).     meloxicam  (MOBIC ) 15 MG tablet TAKE 1 TABLET (15 MG TOTAL) BY MOUTH DAILY. 30 tablet 0   Multiple Vitamin (MULTIVITAMIN) tablet Take 1 tablet by mouth daily.     rosuvastatin  (CRESTOR ) 20 MG tablet Take 1 tablet (20 mg total) by mouth daily. 90 tablet 3   tamsulosin  (FLOMAX ) 0.4 MG CAPS capsule Take 0.4 mg by mouth daily.     fluticasone -salmeterol (ADVAIR  HFA) 115-21 MCG/ACT inhaler INHALE 2 PUFFS INTO THE LUNGS TWICE A DAY 12 each 11   predniSONE  (DELTASONE ) 20 MG tablet 3 tabs poqday 1-2, 2 tabs poqday 3-4, 1 tab poqday 5-6 (Patient not taking: Reported on 01/27/2024) 12  tablet 0   No facility-administered medications prior to visit.    Review of Systems  Constitutional:  Negative for chills, diaphoresis, fever, malaise/fatigue and weight loss.  HENT:  Negative for congestion.   Respiratory:  Negative for cough, hemoptysis, sputum production, shortness of breath and wheezing.   Cardiovascular:  Negative for chest pain, palpitations and leg swelling.     Objective:   Vitals:   01/27/24 1316  BP: 120/63  Pulse: (!) 58  SpO2: 95%  Weight: 234 lb (106.1 kg)  Height: 6' 2 (1.88 m)   SpO2: 95 %  Physical Exam: General: Well-appearing, no acute distress HENT: [place], AT Eyes: EOMI, no scleral icterus Respiratory: Clear to auscultation bilaterally.  No crackles, wheezing or rales Cardiovascular: RRR, -M/R/G, no JVD Extremities:-Edema,-tenderness Neuro: AAO x4, CNII-XII grossly intact Psych: Normal mood, normal affect  Data Reviewed:  Imaging: CT Chest 04/11/21 - calcified mediastinal lymphadenopathy, predominantly upper lobe nodular and mildly fibrotic parenchyma with perilymphatic distribution nodularity.  CT Chest 10/01/21 - calcified bilateral hilar lymphadenopathy, predominantly upper lobe nodular and mildly fibrotic parenchyma with perilymphatic distribution nodularity. Unchanged compared to 04/11/21  PFT: 03/31/18 FVC 4.16 (82%) FEV1 3.03 (80%) Ratio 73   Interpretation: Normal spirometry  07/02/21 FVC 4.35 (86%) FEV1 2.78 (74%) Ratio 64  TLC 86% DLCO 78% Interpretation: Normal spirometry. No obstructive or restrictive defect. Borderline bronchodilator response. Mildly reduced DLCO  10/15/21 FVC 4.14 (82%) FEV1 2.72 (73%) Ratio 66   Interpretation: Normal spirometry  06/22/22 FVC 4.00 (77%) FEV1 2.78 (72%) Ratio 70   Interpretation: Normal spirometry  01/27/24 FVC 4.00 (78%) FEV1 2.91 (77%) Ratio 73  TLC 87% DLCO 69%. +change FEV1 10% Interpretation: Normal spirometry. Mildly reduced DLCO   Labs:    Latest Ref Rng & Units 10/18/2023    10:27 AM 09/04/2022    8:08 AM 12/30/2021   12:43 PM  CBC  WBC 3.8 - 10.8 Thousand/uL 7.0  7.9  6.7   Hemoglobin 13.2 - 17.1 g/dL 85.1  85.5  85.3   Hematocrit 38.5 - 50.0 % 45.0  42.8  43.1   Platelets 140 - 400 Thousand/uL 227  220  244       Latest Ref Rng & Units 10/18/2023   10:27 AM 09/04/2022    8:08 AM 12/30/2021   12:43 PM  CMP  Glucose 65 - 99 mg/dL 72  95  89   BUN 7 - 25 mg/dL 10  8  13    Creatinine 0.70 - 1.28 mg/dL 9.19  9.14  9.03   Sodium 135 - 146 mmol/L 138  138  139   Potassium 3.5 -  5.3 mmol/L 4.4  4.3  4.5   Chloride 98 - 110 mmol/L 102  103  103   CO2 20 - 32 mmol/L 29  29  28    Calcium  8.6 - 10.3 mg/dL 9.0  8.8  9.2   Total Protein 6.1 - 8.1 g/dL 6.2  6.1  6.3   Total Bilirubin 0.2 - 1.2 mg/dL 0.5  0.5  0.6   AST 10 - 35 U/L 22  19  19    ALT 9 - 46 U/L 21  16  16      07/31/20    Component Ref Range & Units (hover) 2 yr ago  Galactose-alpha-1,3-galactose IgE 9.68 High   Comment: . Previous reports (JACI [phone]) have demonstrated that patients with IgE antibodies to galactose-a-1,3-galactose are at risk for delayed anaphylaxis, angioedema, or urticaria following consumption of beef, pork, or lamb.  Beef IgE 6.79 High   Class 3  LAMB/MUTTON IGE 1.29 High   Class 2  Pork IgE 2.86 High   Class 2  Comment: . The test method is the [employer] ImmunoCAP allergen-specific IgE system. CLASS INTERPRETATION   <0.10 kU/L= 0, Negative; 0.10 - 0.34 kU/L= 0/1, Equivocal/Borderline;  0.35 - 0.69  kU/L=1, Low Positive; 0.70 - 3.49 kU/L=2, Moderate Positive;  3.50  - 17.49 kU/L=3, High Positive; 17.50 - 49.99 kU/L= 4, Very High Positive; 50.00 - 99.99  kU/L= 5, Very High Positive;   >99.99 kU/L=6, Very High Positive . *This test was developed and its performance characteristics determined by [facility]. It has not been cleared or approved by the U.S. Food and Drug Administration.     Assessment & Plan:   Discussion: 73 year old male never smoker  with asthma, sarcoid, alpha gal allergy, pulmonary nodules, HTN who presents for follow-up for asthma and sarcoid. Asthmatic symptoms well controlled on Advair . Discussed clinical course and management of asthma including bronchodilator regimen, preventive care including vaccinations (decline RSV, covid) and action plan for exacerbation.  Sarcoid history reviewed. Asymptomatic with stable chest imaging and PFTs >5 years. Plan to continue surveillance with PFTss every 18 months. Chest imaging indicated only if symptomatic or if PFTs worsen.   Mild persistent asthma --CONTINUE Advair  115-21 TWO puffs in the morning and evening. Rinse mouth after use --CONTINUE Albuterol  AS NEEDED for shortness of breath or wheeze --Reviewed pulmonary function test. Normal with borderline reduced DLCO  Pulmonary sarcoidosis - asymptomatic --Initially seen 1989 during SWAT team evaluation, in 2007 via CT with hilar adenopathy  but never had biopsy --No history of treatment/immunosuppression --Stable CT with calcified lymphadenopathy. No further imaging indicated  Sarcoid Monitoring --Recent chest imaging reviewed.  --Annual PFTs.  Last PFTs 06/22/22 --Annual ophthalmology exam.  Last visit on 2025 --Routine labs as needed: CBC with diff, CMET, 1, 25 and 25 hydroxy vitamin D, urinary calcium   Alpha gal allergy --RE-REFER to [facility] with Dr. Glendia Commins  Health Maintenance Immunization History  Administered Date(s) Administered   Fluad Quad(high Dose 65+) 05/18/2019   INFLUENZA, HIGH DOSE SEASONAL PF 03/01/2018   Influenza,inj,Quad PF,6+ Mos 02/27/2014, 06/25/2015, 02/20/2016   Influenza-Unspecified 03/02/2017   PFIZER(Purple Top)SARS-COV-2 Vaccination 07/14/2019, 08/09/2019   PNEUMOCOCCAL CONJUGATE-20 06/04/2021   Pneumococcal Conjugate-13 03/17/2018   Tdap 11/14/2009, 12/16/2013   Zoster Recombinant(Shingrix ) 10/18/2023, 12/28/2023   Zoster, Live 02/27/2014   CT Lung Screen - not qualified  Orders  Placed This Encounter  Procedures   Ambulatory referral to Allergy    Referral Priority:   Routine    Referral Type:  Allergy Testing    Referral Reason:   Specialty Services Required    Requested Specialty:   Allergy    Number of Visits Requested:   1   Meds ordered this encounter  Medications   fluticasone -salmeterol (ADVAIR  HFA) 115-21 MCG/ACT inhaler    Sig: Inhale 2 puffs into the lungs 2 (two) times daily.    Dispense:  12 each    Refill:  11    Brand name    Return in about 1 year (around 01/26/2025).   I have spent a total time of 30-minutes on the day of the appointment including chart review, data review, collecting history, coordinating care and discussing medical diagnosis and plan with the patient/family. Past medical history, allergies, medications were reviewed. Pertinent imaging, labs and tests included in this note have been reviewed and interpreted independently by me.  Buell Parcel Slater Staff, MD [facility] 01/27/2024 2:04 PM

## 2024-01-27 NOTE — Progress Notes (Signed)
 Full PFT performed today.

## 2024-01-31 ENCOUNTER — Ambulatory Visit (INDEPENDENT_AMBULATORY_CARE_PROVIDER_SITE_OTHER): Admitting: Podiatry

## 2024-01-31 DIAGNOSIS — Z91199 Patient's noncompliance with other medical treatment and regimen due to unspecified reason: Secondary | ICD-10-CM

## 2024-01-31 NOTE — Progress Notes (Signed)
Cancel (sick )

## 2024-02-07 DIAGNOSIS — H0102B Squamous blepharitis left eye, upper and lower eyelids: Secondary | ICD-10-CM | POA: Diagnosis not present

## 2024-02-07 DIAGNOSIS — H2513 Age-related nuclear cataract, bilateral: Secondary | ICD-10-CM | POA: Diagnosis not present

## 2024-02-07 DIAGNOSIS — H0102A Squamous blepharitis right eye, upper and lower eyelids: Secondary | ICD-10-CM | POA: Diagnosis not present

## 2024-02-07 DIAGNOSIS — H04123 Dry eye syndrome of bilateral lacrimal glands: Secondary | ICD-10-CM | POA: Diagnosis not present

## 2024-02-09 ENCOUNTER — Ambulatory Visit: Admitting: Podiatry

## 2024-02-09 ENCOUNTER — Encounter: Payer: Self-pay | Admitting: Podiatry

## 2024-02-09 DIAGNOSIS — L821 Other seborrheic keratosis: Secondary | ICD-10-CM | POA: Diagnosis not present

## 2024-02-09 DIAGNOSIS — G5751 Tarsal tunnel syndrome, right lower limb: Secondary | ICD-10-CM

## 2024-02-09 DIAGNOSIS — Z85828 Personal history of other malignant neoplasm of skin: Secondary | ICD-10-CM | POA: Diagnosis not present

## 2024-02-09 DIAGNOSIS — L570 Actinic keratosis: Secondary | ICD-10-CM | POA: Diagnosis not present

## 2024-02-09 DIAGNOSIS — D225 Melanocytic nevi of trunk: Secondary | ICD-10-CM | POA: Diagnosis not present

## 2024-02-09 NOTE — Progress Notes (Signed)
  Subjective:  Patient ID: Thomas Livingston, male    DOB: Aug 20, 1950,   MRN: [id number]  Chief Complaint  Patient presents with   Foot Pain    It's doing a lot better but it still doesn't like to be touched.    73 y.o. male presents for follow up of right tarsal tunnel syndrome . Does relate not as painful and is better. Still getting some pain.  Has been wearing inserts and brace and taking meloxicam  as needed. Some stretching . Denies any other pedal complaints. Denies n/v/f/c.   Past Medical History:  Diagnosis Date   Allergy    Arthritis    left hip   Asthma    Cataract 2023   Found by optometrist   History of kidney stones    Hyperlipidemia    Hypertension 2012   no bp meds since 2012   Multiple lung nodules on CT 2007   no current follow up done   Psoriasis    both legs   Urticaria    Urticaria 03/31/2018    Objective:  Physical Exam: Vascular: DP/PT pulses 2/4 bilateral. CFT <3 seconds. Normal hair growth on digits. No edema.  Skin. No lacerations or abrasions bilateral feet.  Musculoskeletal: MMT 5/5 bilateral lower extremities in DF, PF, Inversion and Eversion. Deceased ROM in DF of ankle joint. Mildly tender to medial right ankle in area of tarsal tunnel very sensitive to touch. No pain with Eversion inversion PF or DF of the foot. No pain with heel lift and inversion of the heel.  Neurological: Sensation intact to light touch. Positive tinel sign on right.   Assessment:   1. Tarsal tunnel syndrome, right       Plan:  Patient was evaluated and treated and all questions answered. X-rays reviewed and discussed with patient. No acute fractures or dislocations some collapse of medial arch and pes planus noted.  Discussed tarsal tunnel syndrome and treatment options at length with patient Will continue stretching.  Continue meloxicam  Continue brace and support. Discussed CMO.  Discussed that if the symptoms do not improve can consider PT/MRI. Patient to  returnas needed   [place] Failing, DPM

## 2024-03-10 DIAGNOSIS — I2510 Atherosclerotic heart disease of native coronary artery without angina pectoris: Secondary | ICD-10-CM | POA: Diagnosis not present

## 2024-03-10 DIAGNOSIS — D869 Sarcoidosis, unspecified: Secondary | ICD-10-CM | POA: Diagnosis not present

## 2024-03-10 DIAGNOSIS — Z91018 Allergy to other foods: Secondary | ICD-10-CM | POA: Diagnosis not present

## 2024-03-10 DIAGNOSIS — J45909 Unspecified asthma, uncomplicated: Secondary | ICD-10-CM | POA: Diagnosis not present

## 2024-03-29 DIAGNOSIS — K080 Exfoliation of teeth due to systemic causes: Secondary | ICD-10-CM | POA: Diagnosis not present

## 2024-04-12 DIAGNOSIS — K080 Exfoliation of teeth due to systemic causes: Secondary | ICD-10-CM | POA: Diagnosis not present

## 2024-10-12 ENCOUNTER — Encounter
